# Patient Record
Sex: Male | Born: 1935 | Race: White | Hispanic: No | Marital: Married | Smoking: Former smoker
Health system: Southern US, Community
[De-identification: ages and names within clinical notes are randomized; demographics above are authoritative.]

## PROBLEM LIST (undated history)

## (undated) DIAGNOSIS — I639 Cerebral infarction, unspecified: Secondary | ICD-10-CM

## (undated) DIAGNOSIS — I509 Heart failure, unspecified: Secondary | ICD-10-CM

## (undated) DIAGNOSIS — I5032 Chronic diastolic (congestive) heart failure: Secondary | ICD-10-CM

## (undated) DIAGNOSIS — C61 Malignant neoplasm of prostate: Secondary | ICD-10-CM

## (undated) DIAGNOSIS — K2100 Gastro-esophageal reflux disease with esophagitis, without bleeding: Secondary | ICD-10-CM

## (undated) DIAGNOSIS — H547 Unspecified visual loss: Secondary | ICD-10-CM

## (undated) DIAGNOSIS — I10 Essential (primary) hypertension: Secondary | ICD-10-CM

## (undated) DIAGNOSIS — I350 Nonrheumatic aortic (valve) stenosis: Secondary | ICD-10-CM

## (undated) DIAGNOSIS — I351 Nonrheumatic aortic (valve) insufficiency: Secondary | ICD-10-CM

## (undated) DIAGNOSIS — D649 Anemia, unspecified: Secondary | ICD-10-CM

## (undated) DIAGNOSIS — I340 Nonrheumatic mitral (valve) insufficiency: Secondary | ICD-10-CM

## (undated) DIAGNOSIS — G8929 Other chronic pain: Secondary | ICD-10-CM

## (undated) HISTORY — DX: Gastro-esophageal reflux disease with esophagitis, without bleeding: K21.00

## (undated) HISTORY — DX: Gastro-esophageal reflux disease with esophagitis: K21.0

## (undated) HISTORY — DX: Essential (primary) hypertension: I10

## (undated) HISTORY — DX: Anemia, unspecified: D64.9

## (undated) HISTORY — DX: Unspecified visual loss: H54.7

## (undated) HISTORY — DX: Heart failure, unspecified: I50.9

## (undated) HISTORY — PX: COLONOSCOPY: SHX174

## (undated) HISTORY — DX: Malignant neoplasm of prostate: C61

## (undated) HISTORY — PX: OTHER SURGICAL HISTORY: SHX169

## (undated) HISTORY — DX: Other chronic pain: G89.29

## (undated) HISTORY — DX: Cerebral infarction, unspecified: I63.9

---

## 2013-03-10 ENCOUNTER — Encounter: Payer: Self-pay | Admitting: *Deleted

## 2013-03-11 ENCOUNTER — Encounter (INDEPENDENT_AMBULATORY_CARE_PROVIDER_SITE_OTHER): Payer: Self-pay

## 2013-03-11 ENCOUNTER — Ambulatory Visit (INDEPENDENT_AMBULATORY_CARE_PROVIDER_SITE_OTHER): Payer: Medicare Other | Admitting: Cardiovascular Disease

## 2013-03-11 ENCOUNTER — Encounter: Payer: Self-pay | Admitting: Cardiovascular Disease

## 2013-03-11 VITALS — BP 132/72 | HR 106 | Ht 69.0 in | Wt 180.2 lb

## 2013-03-11 DIAGNOSIS — I350 Nonrheumatic aortic (valve) stenosis: Secondary | ICD-10-CM | POA: Insufficient documentation

## 2013-03-11 DIAGNOSIS — R42 Dizziness and giddiness: Secondary | ICD-10-CM

## 2013-03-11 DIAGNOSIS — R011 Cardiac murmur, unspecified: Secondary | ICD-10-CM

## 2013-03-11 DIAGNOSIS — I498 Other specified cardiac arrhythmias: Secondary | ICD-10-CM

## 2013-03-11 DIAGNOSIS — R0602 Shortness of breath: Secondary | ICD-10-CM

## 2013-03-11 DIAGNOSIS — R000 Tachycardia, unspecified: Secondary | ICD-10-CM | POA: Insufficient documentation

## 2013-03-11 MED ORDER — METOPROLOL TARTRATE 25 MG PO TABS
25.0000 mg | ORAL_TABLET | Freq: Two times a day (BID) | ORAL | Status: DC
Start: 2013-03-11 — End: 2013-03-11

## 2013-03-11 MED ORDER — METOPROLOL TARTRATE 25 MG PO TABS: 25.0000 mg | ORAL_TABLET | Freq: Two times a day (BID) | ORAL | Status: DC

## 2013-03-11 NOTE — Progress Notes (Signed)
Nathan Kramer Date of Birth  1936-03-30       [facility]    [employer] [address]  [address] [place], [place]  [address]   [place], [place]  [address] [phone]     [phone]   Fax  [phone]    Fax [phone]  Problem List: 1. Heart Murmur 2. HTN 3. CVA - June 2014 4. Back injuries ( on chronic morphine)   History of Present Illness:   Nathan Kramer is a 77 yo from [facility] - recently moved to [place]. He has a history of a heart murmur. He was followed up there by cardiologist.    He was seen by Dr. Fawn Kirk  From the [facility] cardiology group.  He had a stroke this past summer. He  and his wife moved to [place] because of his health issues.  His last cardiologist suggested that his heart murmur was not serious.  He was recently seen by medical doctor who referred him to [place] for further evaluation and management of his heart murmur.  He's not able to get any regular exercise. He does move around the house. 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     Current Outpatient Prescriptions on File Prior to Visit  Medication Sig Dispense Refill  . diphenhydrAMINE (BENADRYL) 25 MG tablet Take 25 mg by mouth daily.      [name] esomeprazole (NEXIUM) 10 MG packet Take 20 mg by mouth daily before breakfast.      . hydrochlorothiazide (HYDRODIURIL) 25 MG tablet Take 25 mg by mouth as needed.       [name] morphine (MSIR) 30 MG tablet Take 30 mg by mouth 2 (two) times daily.       No current facility-administered medications on file prior to visit.    No Known Allergies  Past Medical History  Diagnosis Date  . Other chronic pain   . Unspecified essential hypertension   . Unspecified visual loss   . Reflux esophagitis   . Undiagnosed cardiac murmurs   . Prostate cancer     Past Surgical History  Procedure Laterality Date  . Back surgery    . Colonoscopy       History  Smoking status  . Current Some Day Smoker -- 1.00 packs/day for 3 years  . Types: Cigarettes  Smokeless tobacco  . Not on file    History  Alcohol Use No    Family History  Problem Relation Age of Onset  . Diabetes Mother   . Hypertension Mother   . Hypertension Father   . Heart disease Father   . Cancer Sister   . Heart disease Brother     Reviw of Systems:  Reviewed in the HPI.  All other systems are negative.  Physical Exam: Blood pressure 132/72, pulse 106, height 5\' 9"  (1.753 m), weight 180 lb 4 oz (81.761 kg). General: Well developed, well nourished, in no acute distress.  Head: Normocephalic, atraumatic, sclera non-icteric, mucus membranes are moist,   Neck: Supple. Carotids are 2 + without bruits. No JVD   Lungs: Clear   Heart: RR, tachycardic,  2/6 systolic murmur radiating to the axilla  Abdomen: Soft, non-tender, non-distended with normal bowel sounds.  Msk:  Strength and tone are normal   Extremities: No clubbing or cyanosis. No edema.  Distal pedal pulses are 2+ and equal  Neuro: CN II - XII intact.  Alert and oriented X 3.   Psych:  Normal   ECG: Dec. 9, 2014:  Sinus tachy,  NS ST changes,    Assessment / Plan:

## 2013-03-11 NOTE — Assessment & Plan Note (Signed)
His HR is fast.  Will start metoprolol 25 mg BID.  We may need to decrease it to 12.5 BID - I have asked them to call if he has any low BP symptoms or low BP readings.   I will see him in 6 months.

## 2013-03-11 NOTE — Assessment & Plan Note (Signed)
Nathan Kramer has a heart murmur that has been followed by cardiologist for several years. He was told that it was not especially serious. Murmurs the murmur sounds like it could be accommodation of aortic stenosis plus mitral regurgitation.  We have requested the echocardiogram that was performed last year. Is hemodynamically stable.  His heart rate is fast. We'll start him on metoprolol 25 mg twice a day. I'll see him in 6 months for followup visit. I have requested that he take his heart rate and blood pressure readings and record them on a legal pad.

## 2013-03-11 NOTE — Patient Instructions (Addendum)
Please start metoprolol 25mg  twice a day.  Your physician recommends that you schedule a follow-up appointment in: 6 months.

## 2013-03-25 ENCOUNTER — Ambulatory Visit: Payer: Self-pay | Admitting: Pain Medicine

## 2013-04-24 ENCOUNTER — Ambulatory Visit: Payer: Self-pay | Admitting: Pain Medicine

## 2013-04-25 ENCOUNTER — Ambulatory Visit: Payer: Self-pay | Admitting: Pain Medicine

## 2013-04-25 ENCOUNTER — Telehealth: Payer: Self-pay

## 2013-04-25 NOTE — Telephone Encounter (Signed)
Pt PCP, Dr Debbora Dus, called and questioned why Dr.  Acie Fredrickson did not order echo. Also states pt needs medical clearance per Dr Mohammed Kindle for a lumbar injection procedure. Dr. Miles Costain request we call the pt to let know status of  medical clearance. I will re- fax request for echo done in 2013.

## 2013-04-26 NOTE — Telephone Encounter (Signed)
Please inform the patient's primary medical doctor that the patient has already had an echo and I am waiting to get that report before ordering more tests.  The patient is asymptomatic and I think that it is very unlikely that he has a serious valve lesion that would require surgery.  If we do not receive the echo soon, we can order another one.

## 2013-04-26 NOTE — Telephone Encounter (Signed)
I do not need the echo results or any further testing to clear him for a back injection.  He is not on anticoagulants. He is at low risk for cardiac complications.

## 2013-04-28 NOTE — Telephone Encounter (Signed)
Left message for pt to call back  °

## 2013-04-28 NOTE — Telephone Encounter (Signed)
Spoke w/ pt's wife and made her aware of clearance. She would like [place] to send a letter to [facility] Primus B[facility]. Advised her that we have sent a second request for echo results and will call her if anything needs to be addressed once we receive this.  She is agreeable to this and will call w/ further questions or concerns.

## 2013-04-29 ENCOUNTER — Encounter: Payer: Self-pay | Admitting: *Deleted

## 2013-05-07 ENCOUNTER — Ambulatory Visit: Payer: Self-pay | Admitting: Pain Medicine

## 2013-05-08 ENCOUNTER — Telehealth: Payer: Self-pay | Admitting: *Deleted

## 2013-05-08 NOTE — Telephone Encounter (Signed)
Approval for facet block sent to [facility] 05/08/13.

## 2013-05-12 ENCOUNTER — Ambulatory Visit: Payer: Self-pay | Admitting: Pain Medicine

## 2013-05-13 ENCOUNTER — Ambulatory Visit: Payer: Self-pay | Admitting: Ophthalmology

## 2013-05-13 LAB — POTASSIUM: POTASSIUM: 4.2 mmol/L (ref 3.5–5.1)

## 2013-05-20 ENCOUNTER — Ambulatory Visit: Payer: Self-pay | Admitting: Ophthalmology

## 2013-06-10 ENCOUNTER — Ambulatory Visit: Payer: Self-pay | Admitting: Pain Medicine

## 2013-06-16 ENCOUNTER — Ambulatory Visit: Payer: Self-pay | Admitting: Pain Medicine

## 2013-07-10 ENCOUNTER — Ambulatory Visit: Payer: Self-pay | Admitting: Pain Medicine

## 2013-07-16 ENCOUNTER — Ambulatory Visit: Payer: Self-pay | Admitting: Family Medicine

## 2013-08-07 ENCOUNTER — Ambulatory Visit: Payer: Self-pay | Admitting: Pain Medicine

## 2013-08-22 ENCOUNTER — Ambulatory Visit: Payer: Self-pay | Admitting: Gastroenterology

## 2013-09-02 ENCOUNTER — Ambulatory Visit: Payer: Self-pay | Admitting: Pain Medicine

## 2013-09-09 ENCOUNTER — Ambulatory Visit (INDEPENDENT_AMBULATORY_CARE_PROVIDER_SITE_OTHER): Payer: Medicare Other | Admitting: Cardiovascular Disease

## 2013-09-09 ENCOUNTER — Encounter: Payer: Self-pay | Admitting: Cardiovascular Disease

## 2013-09-09 VITALS — BP 128/78 | HR 78 | Ht 69.0 in | Wt 180.5 lb

## 2013-09-09 DIAGNOSIS — I350 Nonrheumatic aortic (valve) stenosis: Secondary | ICD-10-CM

## 2013-09-09 DIAGNOSIS — R0602 Shortness of breath: Secondary | ICD-10-CM

## 2013-09-09 DIAGNOSIS — I359 Nonrheumatic aortic valve disorder, unspecified: Secondary | ICD-10-CM

## 2013-09-09 NOTE — Assessment & Plan Note (Signed)
Nathan Kramer is doing well.  He has moderate AS. I do not think that he has any limitations related to his aortic stenosis. I think that all of his limitations are due to his back pain. He also has some generalized deconditioning because of his inability to exercise. Again I think this is secondary to his back problems.  We'll continue with the same medications. I have recommended that he avoid salt. His wife confirms that he sprinkle salt on a lot of his food. I've advised him that reducing his salt will help with his symptoms and will help with his leg edema.  I'll see him again in one year for followup visit.

## 2013-09-09 NOTE — Progress Notes (Signed)
Nathan Kramer Date of Birth  04-09-1935       [facility]    [employer] [address], [place], [place]  [address]   [place], [place]  [address] [phone]     [phone]   Fax  [phone]    Fax [phone]  Problem List: 1. aortic stenosis-mild to moderate 2. HTN 3. CVA - June 2014 4. Back injuries ( on chronic morphine)  5. Prostate cancer-status post radiation therapy  History of Present Illness:   Nathan Kramer is a 78 yo from [facility] - recently moved to [place]. He has a history of a heart murmur. He was followed up there by cardiologist.    He was seen by Dr. Nat Christen  From the [facility] cardiology group.  He had a stroke this past summer. He  and his wife moved to [place] because of his health issues.  His last cardiologist suggested that his heart murmur was not serious.  He was recently seen by medical doctor who referred him to [place] for further evaluation and management of his heart murmur.  He's not able to get any regular exercise. He does move around the house. 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   September 09, 2013:  Pt has done ok.  Has mild - mod aortic stenosis.  He has some dyspnea.  Wife says that he still eats salt. Due to have another back injection .  He did well with the last one several months ago.    Current Outpatient Prescriptions on File Prior to Visit  Medication Sig Dispense Refill  . diphenhydrAMINE (BENADRYL) 25 MG tablet Take 25 mg by mouth daily.      [name] esomeprazole (NEXIUM) 10 MG packet Take 20 mg by mouth daily before breakfast.      . hydrochlorothiazide (HYDRODIURIL) 25 MG tablet Take 25 mg by mouth as needed.       [name] morphine (MSIR) 30 MG tablet Take 30 mg by mouth 2 (two) times daily.       No current facility-administered medications on file prior to visit.    Allergies  Allergen Reactions  .  Amoxicillin     Face swelling     Past Medical History  Diagnosis Date  . Other chronic pain   . Unspecified essential hypertension   . Unspecified visual loss   . Reflux esophagitis   . Undiagnosed cardiac murmurs   . Prostate cancer     Past Surgical History  Procedure Laterality Date  . Back surgery    . Colonoscopy      History  Smoking status  . Current Some Day Smoker -- 1.00 packs/day for 50 years  . Types: Cigarettes  Smokeless tobacco  . Not on file    History  Alcohol Use No    Family History  Problem Relation Age of Onset  . Diabetes Mother   . Hypertension Mother   . Hypertension Father   . Heart disease Father   . Cancer Sister   . Heart disease Brother     Reviw of Systems:  Reviewed in the HPI.  All other systems are negative.  Physical Exam: Blood pressure 128/78, pulse 78, height 5\' 9"  (1.753 m), weight 180 lb 8 oz (81.874 kg). General: Well developed, well nourished, in no acute distress.  Head: Normocephalic, atraumatic, sclera non-icteric,  mucus membranes are moist,   Neck: Supple. Carotids are 2 + without bruits. No JVD   Lungs: Clear   Heart: RR, tachycardic,  2/6 systolic harsh murmur radiating to the axilla  Abdomen: Soft, non-tender, non-distended with normal bowel sounds.  Msk:  Strength and tone are normal   Extremities: No clubbing or cyanosis. Trace - 1+ pitting edema.  Distal pedal pulses are 2+ and equal   Neuro: CN II - XII intact.  Alert and oriented X 3.   Psych:  Normal   ECG: 09/09/2013: Normal sinus rhythm at 78. EKG is normal  Assessment / Plan:

## 2013-09-09 NOTE — Patient Instructions (Signed)
Your physician wants you to follow-up in: 1 year. You will receive a reminder letter in the mail two months in advance. If you don't receive a letter, please call our office to schedule the follow-up appointment.  Follow a low sodium diet

## 2013-09-15 ENCOUNTER — Ambulatory Visit: Payer: Self-pay | Admitting: Pain Medicine

## 2013-10-01 ENCOUNTER — Ambulatory Visit: Payer: Self-pay | Admitting: Pain Medicine

## 2013-11-06 ENCOUNTER — Ambulatory Visit: Payer: Self-pay | Admitting: Pain Medicine

## 2013-11-12 ENCOUNTER — Ambulatory Visit: Payer: Self-pay | Admitting: Pain Medicine

## 2013-12-02 ENCOUNTER — Ambulatory Visit: Payer: Self-pay | Admitting: Pain Medicine

## 2013-12-30 ENCOUNTER — Ambulatory Visit: Payer: Self-pay | Admitting: Pain Medicine

## 2014-02-02 ENCOUNTER — Ambulatory Visit: Payer: Self-pay | Admitting: Pain Medicine

## 2014-03-04 ENCOUNTER — Ambulatory Visit: Payer: Self-pay | Admitting: Pain Medicine

## 2014-03-30 ENCOUNTER — Ambulatory Visit: Payer: Self-pay | Admitting: Pain Medicine

## 2014-05-05 ENCOUNTER — Ambulatory Visit: Payer: Self-pay | Admitting: Pain Medicine

## 2014-06-02 ENCOUNTER — Other Ambulatory Visit: Payer: Self-pay | Admitting: Urgent Care

## 2014-06-03 ENCOUNTER — Ambulatory Visit: Payer: Self-pay | Admitting: Pain Medicine

## 2014-06-11 DIAGNOSIS — M545 Low back pain, unspecified: Secondary | ICD-10-CM | POA: Insufficient documentation

## 2014-06-11 DIAGNOSIS — G8929 Other chronic pain: Secondary | ICD-10-CM | POA: Insufficient documentation

## 2014-06-11 DIAGNOSIS — Z8546 Personal history of malignant neoplasm of prostate: Secondary | ICD-10-CM | POA: Insufficient documentation

## 2014-06-11 DIAGNOSIS — K219 Gastro-esophageal reflux disease without esophagitis: Secondary | ICD-10-CM | POA: Insufficient documentation

## 2014-06-11 DIAGNOSIS — R011 Cardiac murmur, unspecified: Secondary | ICD-10-CM | POA: Insufficient documentation

## 2014-06-11 DIAGNOSIS — H543 Unqualified visual loss, both eyes: Secondary | ICD-10-CM | POA: Insufficient documentation

## 2014-06-30 ENCOUNTER — Ambulatory Visit: Payer: Self-pay | Admitting: Pain Medicine

## 2014-07-25 NOTE — Op Note (Signed)
PATIENT NAME:  Nathan Kramer, Nathan Kramer MR#:  [id number] DATE OF BIRTH:  May 30, 1935  DATE OF PROCEDURE:  05/20/2013  PREOPERATIVE DIAGNOSIS: Visually significant cataract of the left eye.   POSTOPERATIVE DIAGNOSIS: Visually significant cataract of the left eye.   OPERATIVE PROCEDURE: Cataract extraction by phacoemulsification with implant of intraocular lens to left eye.   SURGEON: Birder Robson, MD.   ANESTHESIA:  1. Managed anesthesia care.  2. Topical tetracaine drops followed by 2% Xylocaine jelly applied in the preoperative holding area.   COMPLICATIONS: None.   TECHNIQUE:  Stop-and-chop.  DESCRIPTION OF PROCEDURE: The patient was examined and consented in the preoperative holding area where the aforementioned topical anesthesia was applied to the left eye and then brought back to the Operating Room where the left eye was prepped and draped in the usual sterile ophthalmic fashion and a lid speculum was placed. A paracentesis was created with the side port blade and the anterior chamber was filled with viscoelastic. A near clear corneal incision was performed with the steel keratome. A continuous curvilinear capsulorrhexis was performed with a cystotome followed by the capsulorrhexis forceps. Hydrodissection and hydrodelineation were carried out with BSS on a blunt cannula. The lens was removed in a stop-and-chop technique and the remaining cortical material was removed with the irrigation-aspiration handpiece. The capsular bag was inflated with viscoelastic and the Tecnis ZCB00 23.0-diopter lens, serial number [id number] was placed in the capsular bag without complication. The remaining viscoelastic was removed from the eye with the irrigation-aspiration handpiece. The wounds were hydrated. The anterior chamber was flushed with Miostat and the eye was inflated to physiologic pressure. 0.1 mL of cefuroxime concentration 10 mg/mL was placed in the anterior chamber. The wounds were found to be water  tight. The eye was dressed with Vigamox. The patient was given protective glasses to wear throughout the day and a shield with which to sleep tonight. The patient was also given drops with which to begin a drop regimen today and will follow-up with me in one day.   Note, following removal of the cataract and inflation of the capsule with viscoelastic, the iris prolapsed through the main incision. It was reposited using an iris sweep. The lens was placed in the eye uneventfully and a single 10-0 nylon suture was placed through the main incision for safety. The events of this case were discussed with the patient and the family members immediately following surgery.   ____________________________ Livingston Diones Andie Mortimer, MD wlp:cs D: 05/20/2013 19:57:58 ET T: 05/20/2013 20:10:42 ET JOB#: [id number]  cc: Keeshawn Fakhouri L. Pernell Lenoir, MD, <Dictator> Livingston Diones Shanaya Schneck MD ELECTRONICALLY SIGNED 05/22/2013 11:57

## 2014-07-30 ENCOUNTER — Ambulatory Visit
Admit: 2014-07-30 | Disposition: A | Discharge status: Home / Self Care | Payer: Self-pay | Attending: Pain Medicine | Admitting: Pain Medicine

## 2014-08-20 ENCOUNTER — Ambulatory Visit (INDEPENDENT_AMBULATORY_CARE_PROVIDER_SITE_OTHER): Payer: Medicare Other | Admitting: Cardiovascular Disease

## 2014-08-20 ENCOUNTER — Encounter: Payer: Self-pay | Admitting: Cardiovascular Disease

## 2014-08-20 VITALS — BP 118/60 | HR 88 | Ht 68.0 in | Wt 169.0 lb

## 2014-08-20 DIAGNOSIS — I10 Essential (primary) hypertension: Secondary | ICD-10-CM | POA: Diagnosis not present

## 2014-08-20 DIAGNOSIS — I350 Nonrheumatic aortic (valve) stenosis: Secondary | ICD-10-CM

## 2014-08-20 DIAGNOSIS — I471 Supraventricular tachycardia: Secondary | ICD-10-CM

## 2014-08-20 DIAGNOSIS — R000 Tachycardia, unspecified: Secondary | ICD-10-CM

## 2014-08-20 NOTE — Progress Notes (Signed)
Cardiology Office Note   Date:  08/20/2014   ID:  Nathan Kramer, DOB 1935/05/10, MRN [id number]  PCP:  Nathan Harrier, MD  Cardiologist:   Nathan Headings, MD   Chief Complaint  Patient presents with  . Follow-up    12 month follow up. Meds reviewed by the patient verbally. "doing well."   1. aortic stenosis-mild to moderate 2. HTN 3. CVA - June 2014 4. Back injuries ( on chronic morphine)  5. Prostate cancer-status post radiation therapy  History of Present Illness:  Nathan Kramer is a 79 yo from [facility] - recently moved to [place]. He has a history of a heart murmur. He was followed up there by cardiologist. He was seen by Nathan Kramer From the [facility] cardiology group. He had a stroke this past summer. He and his wife moved to [place] because of his health issues. His last cardiologist suggested that his heart murmur was not serious.  He was recently seen by medical doctor who referred him to [place] for further evaluation and management of his heart murmur.  He's not able to get any regular exercise. He does move around the house. 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   September 09, 2013:  Pt has done ok. Has mild - mod aortic stenosis. He has some dyspnea. Wife says that he still eats salt. Due to have another back injection . He did well with the last one several months ago   Aug 20, 2014:  Nathan Kramer is a 79 y.o. male who presents for follow-up of his hypertension and mild aortic stenosis. He has lost about 20 lbs.  Doing well.    Past Medical History  Diagnosis Date  . Other chronic pain   . Unspecified essential hypertension   . Unspecified visual loss   . Reflux esophagitis   . Undiagnosed cardiac murmurs   . Prostate cancer   . Anemia   . Stroke     Past Surgical History  Procedure Laterality Date  . Back surgery    . Colonoscopy       Current Outpatient  Prescriptions  Medication Sig Dispense Refill  . diphenhydrAMINE (BENADRYL) 25 MG tablet Take 25 mg by mouth daily.    [name] esomeprazole (NEXIUM) 10 MG packet Take 20 mg by mouth daily before breakfast.    . hydrochlorothiazide (HYDRODIURIL) 25 MG tablet Take 25 mg by mouth as needed.     [name] HYDROcodone-acetaminophen (NORCO/VICODIN) 5-325 MG per tablet Take 1 tablet by mouth every 6 (six) hours as needed for moderate pain.    . metoprolol tartrate (LOPRESSOR) 25 MG tablet Take 12.5 mg by mouth at bedtime.    [name] morphine (MSIR) 30 MG tablet Take 30 mg by mouth 2 (two) times daily.     No current facility-administered medications for this visit.    Allergies:   Amoxicillin    Social History:  The patient  reports that he has been smoking Cigarettes.  He has a 50 pack-year smoking history. He does not have any smokeless tobacco history on file. He reports that he does not drink alcohol or use illicit drugs.   Family History:  The patient's family history includes Cancer in his sister; Diabetes in his mother; Heart disease in his brother and father; Hypertension in his father and mother.    ROS:  Please see the history of present illness.    Review of Systems:  Constitutional:  denies fever, chills, diaphoresis, appetite change and fatigue.  HEENT: denies photophobia, eye pain, redness, hearing loss, ear pain, congestion, sore throat, rhinorrhea, sneezing, neck pain, neck stiffness and tinnitus.  Respiratory: denies SOB, DOE, cough, chest tightness, and wheezing.  Cardiovascular: denies chest pain, palpitations and leg swelling.  Gastrointestinal: denies nausea, vomiting, abdominal pain, diarrhea, constipation, blood in stool.  Genitourinary: denies dysuria, urgency, frequency, hematuria, flank pain and difficulty urinating.  Musculoskeletal: denies  myalgias, back pain, joint swelling, arthralgias and gait problem.   Skin: denies pallor, rash and wound.  Neurological: denies dizziness,  seizures, syncope, weakness, light-headedness, numbness and headaches.   Hematological: denies adenopathy, easy bruising, personal or family bleeding history.  Psychiatric/ Behavioral: denies suicidal ideation, mood changes, confusion, nervousness, sleep disturbance and agitation.       All other systems are reviewed and negative.    PHYSICAL EXAM: VS:  BP 118/60 mmHg  Pulse 88  Ht 5\' 8"  (1.727 m)  Wt 76.658 kg (169 lb)  BMI 25.70 kg/m2 , BMI Body mass index is 25.7 kg/(m^2). GEN: Well nourished, well developed, in no acute distress HEENT: normal Neck: no JVD, carotid bruits, or masses Cardiac: RR; 2/6  Systolic murmur, rubs, or gallops, no edema  Respiratory:  clear to auscultation bilaterally, normal work of breathing GI: soft, nontender, nondistended, + BS MS: no deformity or atrophy Skin: warm and dry, no rash Neuro:  Strength and sensation are intact Psych: normal   EKG:  EKG is ordered today. The ekg ordered today demonstrates NSR at 88, normal    Recent Labs: No results found for requested labs within last 365 days.    Lipid Panel No results found for: CHOL, TRIG, HDL, CHOLHDL, VLDL, LDLCALC, LDLDIRECT    Wt Readings from Last 3 Encounters:  08/20/14 76.658 kg (169 lb)  09/09/13 81.874 kg (180 lb 8 oz)  03/11/13 81.761 kg (180 lb 4 oz)      Other studies Reviewed: Additional studies/ records that were reviewed today include: . Review of the above records demonstrates:    ASSESSMENT AND PLAN:  1. aortic stenosis-mild to moderate 2. HTN 3. CVA - June 2014 4. Back injuries ( on chronic morphine)  5. Prostate cancer-status post radiation therapy   Current medicines are reviewed at length with the patient today.  The patient does not have concerns regarding medicines.  The following changes have been made:  no change  Labs/ tests ordered today include:  Orders Placed This Encounter  Procedures  . EKG 12-Lead     Disposition:   FU with me in 1  year.     Nathan Kramer, Nathan Cheng, MD  08/20/2014 2:11 PM    [facility] [address], [place], [place]  [address] Phone: [phone]; Fax: [phone]   [facility]  [address] [place], [place]  [address] [phone]    Fax [phone]

## 2014-08-20 NOTE — Patient Instructions (Signed)
Medication Instructions:  Your physician recommends that you continue on your current medications as directed. Please refer to the Current Medication list given to you today.   Labwork: None  Testing/Procedures: None  Follow-Up: Your physician wants you to follow-up in: ONE YEAR with Dr. Acie Fredrickson. You will receive a reminder letter in the mail two months in advance. If you don't receive a letter, please call our office to schedule the follow-up appointment.   Any Other Special Instructions Will Be Listed Below (If Applicable).

## 2014-08-31 ENCOUNTER — Other Ambulatory Visit: Payer: Self-pay | Admitting: Pain Medicine

## 2014-08-31 DIAGNOSIS — M5136 Other intervertebral disc degeneration, lumbar region: Secondary | ICD-10-CM

## 2014-08-31 DIAGNOSIS — M5030 Other cervical disc degeneration, unspecified cervical region: Secondary | ICD-10-CM

## 2014-08-31 DIAGNOSIS — M48062 Spinal stenosis, lumbar region with neurogenic claudication: Secondary | ICD-10-CM

## 2014-08-31 DIAGNOSIS — M533 Sacrococcygeal disorders, not elsewhere classified: Secondary | ICD-10-CM

## 2014-08-31 DIAGNOSIS — M47817 Spondylosis without myelopathy or radiculopathy, lumbosacral region: Secondary | ICD-10-CM

## 2014-08-31 DIAGNOSIS — M51369 Other intervertebral disc degeneration, lumbar region without mention of lumbar back pain or lower extremity pain: Secondary | ICD-10-CM

## 2014-08-31 DIAGNOSIS — M5134 Other intervertebral disc degeneration, thoracic region: Secondary | ICD-10-CM

## 2014-09-01 ENCOUNTER — Ambulatory Visit: Payer: Medicare Other | Attending: Pain Medicine | Admitting: Pain Medicine

## 2014-09-01 ENCOUNTER — Encounter: Payer: Self-pay | Admitting: Pain Medicine

## 2014-09-01 VITALS — BP 108/74 | HR 88 | Temp 98.2°F | Resp 18 | Ht 68.0 in | Wt 157.0 lb

## 2014-09-01 DIAGNOSIS — M5481 Occipital neuralgia: Secondary | ICD-10-CM | POA: Diagnosis not present

## 2014-09-01 DIAGNOSIS — M47816 Spondylosis without myelopathy or radiculopathy, lumbar region: Secondary | ICD-10-CM | POA: Insufficient documentation

## 2014-09-01 DIAGNOSIS — Z981 Arthrodesis status: Secondary | ICD-10-CM | POA: Diagnosis not present

## 2014-09-01 DIAGNOSIS — M461 Sacroiliitis, not elsewhere classified: Secondary | ICD-10-CM | POA: Diagnosis not present

## 2014-09-01 DIAGNOSIS — M179 Osteoarthritis of knee, unspecified: Secondary | ICD-10-CM | POA: Insufficient documentation

## 2014-09-01 DIAGNOSIS — M5136 Other intervertebral disc degeneration, lumbar region: Secondary | ICD-10-CM | POA: Diagnosis not present

## 2014-09-01 DIAGNOSIS — M4806 Spinal stenosis, lumbar region: Secondary | ICD-10-CM | POA: Diagnosis not present

## 2014-09-01 DIAGNOSIS — M5134 Other intervertebral disc degeneration, thoracic region: Secondary | ICD-10-CM

## 2014-09-01 DIAGNOSIS — M5126 Other intervertebral disc displacement, lumbar region: Secondary | ICD-10-CM | POA: Diagnosis not present

## 2014-09-01 DIAGNOSIS — M47817 Spondylosis without myelopathy or radiculopathy, lumbosacral region: Secondary | ICD-10-CM

## 2014-09-01 DIAGNOSIS — M546 Pain in thoracic spine: Secondary | ICD-10-CM | POA: Diagnosis present

## 2014-09-01 DIAGNOSIS — M170 Bilateral primary osteoarthritis of knee: Secondary | ICD-10-CM

## 2014-09-01 DIAGNOSIS — M533 Sacrococcygeal disorders, not elsewhere classified: Secondary | ICD-10-CM | POA: Insufficient documentation

## 2014-09-01 DIAGNOSIS — M48062 Spinal stenosis, lumbar region with neurogenic claudication: Secondary | ICD-10-CM

## 2014-09-01 DIAGNOSIS — M5030 Other cervical disc degeneration, unspecified cervical region: Secondary | ICD-10-CM

## 2014-09-01 MED ORDER — HYDROCODONE-ACETAMINOPHEN 10-325 MG PO TABS: ORAL_TABLET | ORAL | Status: DC

## 2014-09-01 MED ORDER — MORPHINE SULFATE 30 MG PO TABS: ORAL_TABLET | ORAL | Status: DC

## 2014-09-01 NOTE — Progress Notes (Signed)
Discharged to home with script for Morphine and Hydrocodone in hand.  Ambulatory.  Pre procedure instructions given with teach back 3.

## 2014-09-01 NOTE — Patient Instructions (Addendum)
Continue present medications.  Geniculate nerve blocks of right knee 09/09/2014  F/U PCP for evaliation of  BP and general medical  condition.  F/U surgical evaluation.  F/U neurological evaluation.  May consider radiofrequency rhizolysis or intraspinal procedures pending response to present treatment and F/U evaluation.  [facility] to [facility] should patient have concerns prior to scheduled return appointment. GENERAL RISKS AND COMPLICATIONS  What are the risk, side effects and possible complications? Generally speaking, most procedures are safe.  However, with any procedure there are risks, side effects, and the possibility of complications.  The risks and complications are dependent upon the sites that are lesioned, or the type of nerve block to be performed.  The closer the procedure is to the spine, the more serious the risks are.  Great care is taken when placing the radio frequency needles, block needles or lesioning probes, but sometimes complications can occur. 1. Infection: Any time there is an injection through the skin, there is a risk of infection.  This is why sterile conditions are used for these blocks.  There are four possible types of infection. 1. Localized skin infection. 2. Central Nervous System Infection-This can be in the form of Meningitis, which can be deadly. 3. Epidural Infections-This can be in the form of an epidural abscess, which can cause pressure inside of the spine, causing compression of the spinal cord with subsequent paralysis. This would require an emergency surgery to decompress, and there are no guarantees that the patient would recover from the paralysis. 4. Discitis-This is an infection of the intervertebral discs.  It occurs in about 1% of discography procedures.  It is difficult to treat and it may lead to surgery.        2. Pain: the needles have to go through skin and soft tissues, will cause soreness.       3. Damage to internal  structures:  The nerves to be lesioned may be near blood vessels or    other nerves which can be potentially damaged.       4. Bleeding: Bleeding is more common if the patient is taking blood thinners such as  aspirin, Coumadin, Ticiid, Plavix, etc., or if he/she have some genetic predisposition  such as hemophilia. Bleeding into the spinal canal can cause compression of the spinal  cord with subsequent paralysis.  This would require an emergency surgery to  decompress and there are no guarantees that the patient would recover from the  paralysis.       5. Pneumothorax:  Puncturing of a lung is a possibility, every time a needle is introduced in  the area of the chest or upper back.  Pneumothorax refers to free air around the  collapsed lung(s), inside of the thoracic cavity (chest cavity).  Another two possible  complications related to a similar event would include: Hemothorax and Chylothorax.   These are variations of the Pneumothorax, where instead of air around the collapsed  lung(s), you may have blood or chyle, respectively.       6. Spinal headaches: They may occur with any procedures in the area of the spine.       7. Persistent CSF (Cerebro-Spinal Fluid) leakage: This is a rare problem, but may occur  with prolonged intrathecal or epidural catheters either due to the formation of a fistulous  track or a dural tear.       8. Nerve damage: By working so close to the spinal cord, there is always a  possibility of  nerve damage, which could be as serious as a permanent spinal cord injury with  paralysis.       9. Death:  Although rare, severe deadly allergic reactions known as "Anaphylactic  reaction" can occur to any of the medications used.      10. Worsening of the symptoms:  We can always make thing worse.  What are the chances of something like this happening? Chances of any of this occuring are extremely low.  By statistics, you have more of a chance of getting killed in a motor vehicle  accident: while driving to the hospital than any of the above occurring .  Nevertheless, you should be aware that they are possibilities.  In general, it is similar to taking a shower.  Everybody knows that you can slip, hit your head and get killed.  Does that mean that you should not shower again?  Nevertheless always keep in mind that statistics do not mean anything if you happen to be on the wrong side of them.  Even if a procedure has a 1 (one) in a 1,000,000 (million) chance of going wrong, it you happen to be that one..Also, keep in mind that by statistics, you have more of a chance of having something go wrong when taking medications.  Who should not have this procedure? If you are on a blood thinning medication (e.g. Coumadin, Plavix, see list of "Blood Thinners"), or if you have an active infection going on, you should not have the procedure.  If you are taking any blood thinners, please inform your physician.  How should I prepare for this procedure?  Do not eat or drink anything at least six hours prior to the procedure.  Bring a driver with you .  It cannot be a taxi.  Come accompanied by an adult that can drive you back, and that is strong enough to help you if your legs get weak or numb from the local anesthetic.  Take all of your medicines the morning of the procedure with just enough water to swallow them.  If you have diabetes, make sure that you are scheduled to have your procedure done first thing in the morning, whenever possible.  If you have diabetes, take only half of your insulin dose and notify our nurse that you have done so as soon as you arrive at the clinic.  If you are diabetic, but only take blood sugar pills (oral hypoglycemic), then do not take them on the morning of your procedure.  You may take them after you have had the procedure.  Do not take aspirin or any aspirin-containing medications, at least eleven (11) days prior to the procedure.  They may prolong  bleeding.  Wear loose fitting clothing that may be easy to take off and that you would not mind if it got stained with Betadine or blood.  Do not wear any jewelry or perfume  Remove any nail coloring.  It will interfere with some of our monitoring equipment.  NOTE: Remember that this is not meant to be interpreted as a complete list of all possible complications.  Unforeseen problems may occur.  BLOOD THINNERS The following drugs contain aspirin or other products, which can cause increased bleeding during surgery and should not be taken for 2 weeks prior to and 1 week after surgery.  If you should need take something for relief of minor pain, you may take acetaminophen which is found in Tylenol,m Datril, Anacin-3 and Panadol. It  is not blood thinner. The products listed below are.  Do not take any of the products listed below in addition to any listed on your instruction sheet.  A.P.C or A.P.C with Codeine Codeine Phosphate Capsules #3 Ibuprofen Ridaura  ABC compound Congesprin Imuran rimadil  Advil Cope Indocin Robaxisal  Alka-Seltzer Effervescent Pain Reliever and Antacid Coricidin or Coricidin-D  Indomethacin Rufen  Alka-Seltzer plus Cold Medicine Cosprin Ketoprofen S-A-C Tablets  Anacin Analgesic Tablets or Capsules Coumadin Korlgesic Salflex  Anacin Extra Strength Analgesic tablets or capsules CP-2 Tablets Lanoril Salicylate  Anaprox Cuprimine Capsules Levenox Salocol  Anexsia-D Dalteparin Magan Salsalate  Anodynos Darvon compound Magnesium Salicylate Sine-off  Ansaid Dasin Capsules Magsal Sodium Salicylate  Anturane Depen Capsules Marnal Soma  APF Arthritis pain formula Dewitt's Pills Measurin Stanback  Argesic Dia-Gesic Meclofenamic Sulfinpyrazone  Arthritis Bayer Timed Release Aspirin Diclofenac Meclomen Sulindac  Arthritis pain formula Anacin Dicumarol Medipren Supac  Analgesic (Safety coated) Arthralgen Diffunasal Mefanamic Suprofen  Arthritis Strength Bufferin Dihydrocodeine  Mepro Compound Suprol  Arthropan liquid Dopirydamole Methcarbomol with Aspirin Synalgos  ASA tablets/Enseals Disalcid Micrainin Tagament  Ascriptin Doan's Midol Talwin  Ascriptin A/D Dolene Mobidin Tanderil  Ascriptin Extra Strength Dolobid Moblgesic Ticlid  Ascriptin with Codeine Doloprin or Doloprin with Codeine Momentum Tolectin  Asperbuf Duoprin Mono-gesic Trendar  Aspergum Duradyne Motrin or Motrin IB Triminicin  Aspirin plain, buffered or enteric coated Durasal Myochrisine Trigesic  Aspirin Suppositories Easprin Nalfon Trillsate  Aspirin with Codeine Ecotrin Regular or Extra Strength Naprosyn Uracel  Atromid-S Efficin Naproxen Ursinus  Auranofin Capsules Elmiron Neocylate Vanquish  Axotal Emagrin Norgesic Verin  Azathioprine Empirin or Empirin with Codeine Normiflo Vitamin E  Azolid Emprazil Nuprin Voltaren  Bayer Aspirin plain, buffered or children's or timed BC Tablets or powders Encaprin Orgaran Warfarin Sodium  Buff-a-Comp Enoxaparin Orudis Zorpin  Buff-a-Comp with Codeine Equegesic Os-Cal-Gesic   Buffaprin Excedrin plain, buffered or Extra Strength Oxalid   Bufferin Arthritis Strength Feldene Oxphenbutazone   Bufferin plain or Extra Strength Feldene Capsules Oxycodone with Aspirin   Bufferin with Codeine Fenoprofen Fenoprofen Pabalate or Pabalate-SF   Buffets II Flogesic Panagesic   Buffinol plain or Extra Strength Florinal or Florinal with Codeine Panwarfarin   Buf-Tabs Flurbiprofen Penicillamine   Butalbital Compound Four-way cold tablets Penicillin   Butazolidin Fragmin Pepto-Bismol   Carbenicillin Geminisyn Percodan   Carna Arthritis Reliever Geopen Persantine   Carprofen Gold's salt Persistin   Chloramphenicol Goody's Phenylbutazone   Chloromycetin Haltrain Piroxlcam   Clmetidine heparin Plaquenil   Cllnoril Hyco-pap Ponstel   Clofibrate Hydroxy chloroquine Propoxyphen         Before stopping any of these medications, be sure to consult the physician who ordered  them.  Some, such as Coumadin (Warfarin) are ordered to prevent or treat serious conditions such as "deep thrombosis", "pumonary embolisms", and other heart problems.  The amount of time that you may need off of the medication may also vary with the medication and the reason for which you were taking it.  If you are taking any of these medications, please make sure you notify your pain physician before you undergo any procedures.         Knee Injection Joint injections are shots. Your caregiver will place a needle into your knee joint. The needle is used to put medicine into the joint. These shots can be used to help treat different painful knee conditions such as osteoarthritis, bursitis, local flare-ups of rheumatoid arthritis, and pseudogout. Anti-inflammatory medicines such as corticosteroids and anesthetics are the most  common medicines used for joint and soft tissue injections.  PROCEDURE 2. The skin over the kneecap will be cleaned with an antiseptic solution. 3. Your caregiver will inject a small amount of a local anesthetic (a medicine like Novocaine) just under the skin in the area that was cleaned. 4. After the area becomes numb, a second injection is done. This second injection usually includes an anesthetic and an anti-inflammatory medicine called a steroid or cortisone. The needle is carefully placed in between the kneecap and the knee, and the medicine is injected into the joint space. 5. After the injection is done, the needle is removed. Your caregiver may place a bandage over the injection site. The whole procedure takes no more than a couple of minutes. BEFORE THE PROCEDURE  Wash all of the skin around the entire knee area. Try to remove any loose, scaling skin. There is no other specific preparation necessary unless advised otherwise by your caregiver. LET YOUR CAREGIVER KNOW ABOUT:   Allergies.  Medications taken including herbs, eye drops, over the counter medications, and  creams.  Use of steroids (by mouth or creams).  Possible pregnancy, if applicable.  Previous problems with anesthetics or Novocaine.  History of blood clots (thrombophlebitis).  History of bleeding or blood problems.  Previous surgery.  Other health problems. RISKS AND COMPLICATIONS Side effects from cortisone shots are rare. They include:   Slight bruising of the skin.  Shrinkage of the normal fatty tissue under the skin where the shot was given.  Increase in pain after the shot.  Infection.  Weakening of tendons or tendon rupture.  Allergic reaction to the medicine.  Diabetics may have a temporary increase in their blood sugar after a shot.  Cortisone can temporarily weaken the immune system. While receiving these shots, you should not get certain vaccines. Also, avoid contact with anyone who has chickenpox or measles. Especially if you have never had these diseases or have not been previously immunized. Your immune system may not be strong enough to fight off the infection while the cortisone is in your system. AFTER THE PROCEDURE   You can go home after the procedure.  You may need to put ice on the joint 15-20 minutes every 3 or 4 hours until the pain goes away.  You may need to put an elastic bandage on the joint. HOME CARE INSTRUCTIONS  12. Only take over-the-counter or prescription medicines for pain, discomfort, or fever as directed by your caregiver. 13. You should avoid stressing the joint. Unless advised otherwise, avoid activities that put a lot of pressure on a knee joint, such as: 1. Jogging. 2. Bicycling. 3. Recreational climbing. 4. Hiking. 14. Laying down and elevating the leg/knee above the level of your heart can help to minimize swelling. SEEK MEDICAL CARE IF:   You have repeated or worsening swelling.  There is drainage from the puncture area.  You develop red streaking that extends above or below the site where the needle was  inserted. SEEK IMMEDIATE MEDICAL CARE IF:   You develop a fever.  You have pain that gets worse even though you are taking pain medicine.  The area is red and warm, and you have trouble moving the joint. MAKE SURE YOU:   Understand these instructions.  Will watch your condition.  Will get help right away if you are not doing well or get worse. Document Released: 06/11/2006 Document Revised: 06/12/2011 Document Reviewed: 03/08/2007 [facility] Patient Information 2015 [place], [place]. This information is not intended  to replace advice given to you by your health care provider. Make sure you discuss any questions you have with your health care provider.  

## 2014-09-01 NOTE — Progress Notes (Signed)
Subjective:    Patient ID: Nathan Kramer, male    DOB: [birth year], 79 y.o.   MRN: [id number]  HPI   patient is 79 year old gentleman [facility] further evaluation and treatment of pain involving the region of the entire back upper and lower extremity regions. On today's visit patient states he has significant pain involving the region of the right knee. Patient denies trauma change in events of daily living the cause change in symptomatology. Patient states he is in hopes of being able to undergo interventional treatment of the knee in attempt to decrease severity of the symptoms. We discussed patient's condition and will proceed with geniculate nerve blocks of the knee at time return appointment in attempt to decrease severity of symptoms minimize the risk of medication escalation hopefully retard progression of patient's symptoms and avoid the need for more involved treatment we will proceed with geniculate nerve blocks of the knee as discussed and explained to patient on today's visit . The patient was understanding and agreement with suggested treatment plan       Review of Systems     Objective:   Physical Exam   there was tenderness over the splenius capitis of talus region of mild degree with mild tenderness of the cervical facet cervical paraspinal musculature region. Palpation of the acromioclavicular and glenohumeral joint regions reproduced mild discomfort as well. Tinel and Phalen's maneuver were without increased pain of significant degree and there was unremarkable Spurling's maneuver.. Grip strength appeared to be slightly decreased Palpation of the thoracic facet thoracic paraspinal musculature region was associated with moderate discomfort with moderate muscle spasms noted. Of the thoracic region was noted. Outpatient over the lumbar paraspinal musculature region lumbar facet region was associated tends to palpation of moderately severe degree with  lateral bending and rotation limited and with evidence of significant muscle spasms of the lumbar paraspinal musculature region. Straight leg raising was limited to approximately 20 without definite increase of pain with dorsiflexion noted with questionable decreased sensation along the L5 dermatomal distribution. There was moderate tenderness over the PSIS and  PII S regions with negative clonus and negative Homans. DTRs difficult to elicit patient had difficulty relaxing examination of the knee revealed patient to be with crepitus of the knee. There was negative anterior and posterior drawer signs without ballottement of the patella. No increased warmth erythema of the knee noted there was significant crepitus of the knee with severe pain with range of motion maneuvers of the knee abdomen nontender and no costovertebral tenderness noted     Assessment & Plan:     Degenerative disc disease lumbar spine  Generative changes L2-3 L3-L4 L4-5 and L5 S1 spondylosis , multilevel degenerative disc disease, multifactorial spinal and lateral recess stenosis and foraminal stenosis L3-4 and diffuse disc bulging, osteophytic ridging and advanced facet disease and bilateral recess stenosis and right foraminal stenosis, early spinal stenosis at L4-5 and similar changes at L5-S1   Status post lumbar fusion   Degenerative joint disease of knee   Sacroiliitis sacroiliac joint dysfunction   Lumbar facet syndrome   Bilateral occipital neuralgia     plan   Continue present medications   Geniculate nerve blocks of need to be performed at time return appointment.  F/U PCP for evaliation of  BP and general medical  condition.  F/U surgical evaluation.  F/U neurological evaluation.  May consider radiofrequency rhizolysis or intraspinal procedures pending response to present treatment and F/U evaluation.  Patient to  call [facility] should patient have concerns prior to scheduled return  appointment.

## 2014-09-08 ENCOUNTER — Other Ambulatory Visit: Payer: Self-pay | Admitting: Pain Medicine

## 2014-09-09 ENCOUNTER — Ambulatory Visit: Payer: Medicare Other | Admitting: Pain Medicine

## 2014-09-16 ENCOUNTER — Encounter: Payer: Self-pay | Admitting: Pain Medicine

## 2014-09-16 ENCOUNTER — Ambulatory Visit: Payer: Medicare Other | Attending: Pain Medicine | Admitting: Pain Medicine

## 2014-09-16 VITALS — BP 138/57 | HR 73 | Temp 98.0°F | Resp 18 | Ht 68.0 in | Wt 160.0 lb

## 2014-09-16 DIAGNOSIS — M5030 Other cervical disc degeneration, unspecified cervical region: Secondary | ICD-10-CM

## 2014-09-16 DIAGNOSIS — B999 Unspecified infectious disease: Secondary | ICD-10-CM | POA: Diagnosis not present

## 2014-09-16 DIAGNOSIS — M79604 Pain in right leg: Secondary | ICD-10-CM | POA: Diagnosis present

## 2014-09-16 DIAGNOSIS — M533 Sacrococcygeal disorders, not elsewhere classified: Secondary | ICD-10-CM | POA: Diagnosis not present

## 2014-09-16 DIAGNOSIS — M5126 Other intervertebral disc displacement, lumbar region: Secondary | ICD-10-CM | POA: Insufficient documentation

## 2014-09-16 DIAGNOSIS — M545 Low back pain: Secondary | ICD-10-CM | POA: Diagnosis present

## 2014-09-16 DIAGNOSIS — M47817 Spondylosis without myelopathy or radiculopathy, lumbosacral region: Secondary | ICD-10-CM

## 2014-09-16 DIAGNOSIS — M4806 Spinal stenosis, lumbar region: Secondary | ICD-10-CM | POA: Diagnosis not present

## 2014-09-16 DIAGNOSIS — M179 Osteoarthritis of knee, unspecified: Secondary | ICD-10-CM | POA: Insufficient documentation

## 2014-09-16 DIAGNOSIS — M79605 Pain in left leg: Secondary | ICD-10-CM | POA: Diagnosis present

## 2014-09-16 DIAGNOSIS — M170 Bilateral primary osteoarthritis of knee: Secondary | ICD-10-CM | POA: Diagnosis not present

## 2014-09-16 DIAGNOSIS — M48062 Spinal stenosis, lumbar region with neurogenic claudication: Secondary | ICD-10-CM

## 2014-09-16 DIAGNOSIS — M47816 Spondylosis without myelopathy or radiculopathy, lumbar region: Secondary | ICD-10-CM | POA: Insufficient documentation

## 2014-09-16 DIAGNOSIS — M5134 Other intervertebral disc degeneration, thoracic region: Secondary | ICD-10-CM

## 2014-09-16 DIAGNOSIS — M5136 Other intervertebral disc degeneration, lumbar region: Secondary | ICD-10-CM | POA: Diagnosis not present

## 2014-09-16 DIAGNOSIS — M1710 Unilateral primary osteoarthritis, unspecified knee: Secondary | ICD-10-CM | POA: Insufficient documentation

## 2014-09-16 MED ORDER — CIPROFLOXACIN HCL 500 MG PO TABS: ORAL_TABLET | ORAL | Status: DC

## 2014-09-16 MED ORDER — DOXYCYCLINE HYCLATE 100 MG PO TABS: 100.0000 mg | ORAL_TABLET | Freq: Two times a day (BID) | ORAL | Status: DC

## 2014-09-16 NOTE — Progress Notes (Signed)
Subjective:    Patient ID: Nathan Kramer, male    DOB: 14-Jun-1935, 79 y.o.   MRN: [id number]  HPI  Patient is 79 year old gentleman who [facility] for further evaluation and treatment of pain involving the lower back and lower extremity regions. Patient was scheduled for geniculate nerve blocks of the right knee. Examination of the patient revealed patient to be with lesions of the right lower extremity as well as the left lower extremity. Due to the lesions of the lower extremities the procedure, geniculate nerve blocks to the knee, was counseled. Patient was given prescription for Cipro and scheduled for follow-up evaluation with his primary care physician Dr.Hande and wound care clinic. Patient denied history of chills or fevers malaise. We advised patient to call [facility] should there be change in condition and we will consider further modifications of treatment regimen as discussed with patient on today's visit. The patient was understanding and agreement with suggested treatment plan.  Review of Systems     Objective:   Physical Exam  There was tenderness over the splenius capitis and occipitalis musculature regions of moderate degree with limited range of motion of the cervical spine. There was tenderness over the cervical facet cervical paraspinal musculature region of moderate degree. There appeared to be unremarkable Spurling's maneuver. Patient appeared to be with slightly decreased grip strength and Tinel and Phalen's maneuver were without increased pain of mild to moderate degree. Palpation of the thoracic facet thoracic paraspinal musculature region was a tends to palpation of mild to moderate degree and no crepitus of the thoracic region noted. Palpation of the lumbar paraspinal musculature region lumbar facet region was a tends to palpation of moderate degree with extension and palpation of the lumbar facets reproducing moderate to moderately  severe discomfort. There was moderate tenderness over the PSIS and PII S regions of moderate degree as well. Straight leg raising limited to 20 without increase of pain with dorsiflexion noted. There was decreased sensation along the L 5 dermatomal. There was negative clonus negative Homans. There was tenderness to palpation of the right knee without increased warmth and erythema of the knee and no ballottement of the patella noted. Lower extremity revealed patient to be with circular lesions of the lower extremity with erythema and with slight crusting of lesions. There was no apparent increased warmth and erythema of the lower extremities noted and no excessive swelling of the lower extremities were noted. Abdomen was nontender and no costovertebral angle tenderness was noted.      Assessment & Plan:  Degenerative disc disease lumbar spine L2-3, L3-4, L4-5, and L5-S1 degenerative changes with lumbar spondylosis multilevel degenerative disc disease, multifactorial spinal and lateral recess stenosis and foraminal stenosis L3-4 diffuse disc bulging, osteophytic multifactorial spinal stenosis and lateral recess stenosis and foraminal stenosis, L3-4 diffuse disc bulging and osteophytic ridging and advanced facet disease with bilateral recess stenosis.  Degenerative joint disease of knees  Sacroiliac joint dysfunction  Degenerative disc disease cervical spine  Infection of the lower extremities(must consider MRSA and other processes)       Plan   Continue present medications. Morphine sulfate extended-release and hydrocodone acetaminophen. Patient was also given prescription for Cipro for treatment of lower extremity lesions and patient was scheduled for further evaluation with Dr. Ginette Pitman and scheduled for wound care clinic evaluation of the lower extremity lesions  F/U PCP for evaliation of  BP and general medical  condition. Follow-up with Dr.Hande for evaluation of lower  extremity lesions and  general medical condition  Wound care clinic evaluation of lower extremities. Patient was scheduled for wound care clinic evaluation today  F/U surgical evaluation.  F/U neurological evaluation.  May consider radiofrequency rhizolysis or intraspinal procedures pending response to present treatment and F/U evaluation.  Patient to call [facility] should patient have concerns prior to scheduled return appointment.

## 2014-09-16 NOTE — Patient Instructions (Addendum)
Continue present medications and antibiotics. Please obtain your antibiotic today and begin taking antibiotic today  F/U PCP for evaliation of  BP and general medical  condition. Please see Dr. Ginette Pitman today for further evaluation of lower extremity lesions  We will schedule appointment for you in the wound care clinic at this time and consider infectious disease evaluation as well  F/U surgical evaluation as discussed  F/U neurological evaluation.  May consider radiofrequency rhizolysis or intraspinal procedures pending response to present treatment and F/U evaluation.  Patient to call [facility] should patient have concerns prior to scheduled return appointment.

## 2014-09-16 NOTE — Progress Notes (Signed)
Discharged to home ambulatory.  Did not get procedure today because of sores on his legs.  Informed to see pcp per Dr Primus Bravo.  Script for Cipro at pharmacy.

## 2014-09-16 NOTE — Progress Notes (Signed)
Safety precautions to be maintained throughout the outpatient stay will include: orient to surroundings, keep bed in low position, maintain call bell within reach at all times, provide assistance with transfer out of bed and ambulation.  

## 2014-10-01 ENCOUNTER — Encounter: Payer: Self-pay | Admitting: Pain Medicine

## 2014-10-01 ENCOUNTER — Ambulatory Visit: Payer: Medicare Other | Attending: Pain Medicine | Admitting: Pain Medicine

## 2014-10-01 VITALS — BP 116/70 | HR 75 | Temp 96.1°F | Resp 18 | Ht 68.0 in | Wt 157.0 lb

## 2014-10-01 DIAGNOSIS — M5126 Other intervertebral disc displacement, lumbar region: Secondary | ICD-10-CM | POA: Diagnosis not present

## 2014-10-01 DIAGNOSIS — R51 Headache: Secondary | ICD-10-CM | POA: Diagnosis not present

## 2014-10-01 DIAGNOSIS — M79604 Pain in right leg: Secondary | ICD-10-CM | POA: Diagnosis present

## 2014-10-01 DIAGNOSIS — M79605 Pain in left leg: Secondary | ICD-10-CM | POA: Diagnosis present

## 2014-10-01 DIAGNOSIS — M4807 Spinal stenosis, lumbosacral region: Secondary | ICD-10-CM | POA: Diagnosis not present

## 2014-10-01 DIAGNOSIS — M5481 Occipital neuralgia: Secondary | ICD-10-CM | POA: Diagnosis not present

## 2014-10-01 DIAGNOSIS — M4806 Spinal stenosis, lumbar region: Secondary | ICD-10-CM | POA: Insufficient documentation

## 2014-10-01 DIAGNOSIS — M179 Osteoarthritis of knee, unspecified: Secondary | ICD-10-CM | POA: Diagnosis not present

## 2014-10-01 DIAGNOSIS — M51369 Other intervertebral disc degeneration, lumbar region without mention of lumbar back pain or lower extremity pain: Secondary | ICD-10-CM

## 2014-10-01 DIAGNOSIS — M48062 Spinal stenosis, lumbar region with neurogenic claudication: Secondary | ICD-10-CM

## 2014-10-01 DIAGNOSIS — M5030 Other cervical disc degeneration, unspecified cervical region: Secondary | ICD-10-CM | POA: Diagnosis not present

## 2014-10-01 DIAGNOSIS — M47817 Spondylosis without myelopathy or radiculopathy, lumbosacral region: Secondary | ICD-10-CM

## 2014-10-01 DIAGNOSIS — M47816 Spondylosis without myelopathy or radiculopathy, lumbar region: Secondary | ICD-10-CM | POA: Insufficient documentation

## 2014-10-01 DIAGNOSIS — M170 Bilateral primary osteoarthritis of knee: Secondary | ICD-10-CM

## 2014-10-01 DIAGNOSIS — M5136 Other intervertebral disc degeneration, lumbar region: Secondary | ICD-10-CM

## 2014-10-01 DIAGNOSIS — M545 Low back pain: Secondary | ICD-10-CM | POA: Diagnosis present

## 2014-10-01 DIAGNOSIS — M5134 Other intervertebral disc degeneration, thoracic region: Secondary | ICD-10-CM

## 2014-10-01 MED ORDER — HYDROCODONE-ACETAMINOPHEN 10-325 MG PO TABS: ORAL_TABLET | ORAL | Status: DC

## 2014-10-01 MED ORDER — MORPHINE SULFATE 30 MG PO TABS: ORAL_TABLET | ORAL | Status: DC

## 2014-10-01 NOTE — Progress Notes (Signed)
Safety precautions to be maintained throughout the outpatient stay will include: orient to surroundings, keep bed in low position, maintain call bell within reach at all times, provide assistance with transfer out of bed and ambulation.  

## 2014-10-01 NOTE — Patient Instructions (Addendum)
Continue present medications hydrocodone acetaminophen and morphine  F/U PCP for evaliation of  BP and general medical  condition.  F/U surgical evaluation  F/U neurological evaluation  May consider radiofrequency rhizolysis or intraspinal procedures pending response to present treatment and F/U evaluation.  Patient to call [facility] should patient have concerns prior to scheduled return appointment.  You were given scripts for Hydrocodone and Morphine today.

## 2014-10-01 NOTE — Progress Notes (Signed)
   Subjective:    Patient ID: Nathan Kramer, male    DOB: Jan 06, 1936, 79 y.o.   MRN: [id number]  HPI  Patient is 79 year old gentleman returns to pain management for follow-up evaluation treatment of pain involving the lower back lower extremity region predominantly pain involving the region of the neck as well as the knees. We will and to perform geniculate nerve blocks of the knee at time of patient's previous visits to [facility]. The procedure was counseled due to the patient being with lesions of the lower extremity. On today's visit we discussed patient's condition and will continue to avoid interventional treatment at this time. Patient's lesions of the lower extremity have apparently resolved. Patient is with history of degenerative joint disease of the knees and present time we will consider patient for geniculate nerve blocks of the knee should there be persistent pain of the knees despite the present treatment. Patient denies any trauma change in events of daily living the call significant change in symptomatology. At the present time we will continue patient's morphine extended release with hydrocodone acetaminophen for breakthrough pain. All understanding and agreement status treatment plan.     Review of Systems     Objective:   Physical Exam  There is tenderness of the spinous And occipitalis region of mild degree. Palpation of the cervical facet cervical paraspinal musculature region reproduced pain of mild degree. There was mild tenderness of the acromial clavicular glenohumeral joint region. There was unremarkable Spurling's maneuver. Tinel and Phalen's maneuver were without increase of pain of significant degree. Palpation of the thoracic facet thoracic paraspinal muscles region was with moderate tends to palpation. Palpation over the lumbar paraspinal muscles region lumbar facet region was with moderate tends to palpation with lateral bending rotation extension and  palpation of the lumbar facets reproducing moderate discomfort. There was moderate tenderness of the PSIS PII S region gluteal and piriformis musculature regions. Straight leg raising tolerates approximately 20 without increased pain with dorsiflexion noted. There was negative clonus negative Homans. No sensory deficit of dermatomal disputed detected. Mild tenderness of the greater trochanteric region iliotibial band region. Examination of the knee with the patient to be with negative anterior posterior drawer signs without ballottement of the patella. There was significant crepitus of the knee and increased pain of the knee with range of motion maneuvers of the knee. Abdomen nontender and no costovertebral tenderness was noted.    Assessment & Plan:  Degenerative disc disease lumbar spine L2-3 L3-4 L4-5 L5-S1 degenerative changes with lumbar spondylosis with multilevel degenerative disc disease lumbar spine, multifactorial spinal and lateral recess stenosis and foraminal stenosis L3-4 diffuse disc bulging, osteophytic multifactorial spinal stenosis and lateral recess stenosis and foraminal stenosis spinal stenosis L4-L5 and similar changes L5-S1  Sacroiliac joint dysfunction  Degenerative joint disease of knee  Degenerative disc disease cervical spine  Cervicogenic headaches  Greater occipital neuralgia    Plan    Continue present medications hydrocodone acetaminophen and morphine  F/U PCP for evaliation of  BP and general medical  condition.  F/U surgical evaluation  F/U neurological evaluation  May consider radiofrequency rhizolysis or intraspinal procedures pending response to present treatment and F/U evaluation.  Patient to call [facility] should patient have concerns prior to scheduled return appointment.

## 2014-10-06 ENCOUNTER — Telehealth: Payer: Self-pay | Admitting: Pain Medicine

## 2014-10-06 ENCOUNTER — Other Ambulatory Visit: Payer: Self-pay | Admitting: Pain Medicine

## 2014-10-06 MED ORDER — MORPHINE SULFATE ER 30 MG PO TBCR: EXTENDED_RELEASE_TABLET | ORAL | Status: DC

## 2014-10-06 NOTE — Telephone Encounter (Signed)
Pt script was written wrong last week and he was not able to get it filled. Pt is out of meds until script is fixed.

## 2014-10-14 DIAGNOSIS — D539 Nutritional anemia, unspecified: Secondary | ICD-10-CM | POA: Insufficient documentation

## 2014-10-29 ENCOUNTER — Encounter: Payer: Self-pay | Admitting: Pain Medicine

## 2014-10-29 ENCOUNTER — Ambulatory Visit: Payer: Medicare Other | Attending: Pain Medicine | Admitting: Pain Medicine

## 2014-10-29 VITALS — BP 128/65 | HR 80 | Temp 97.6°F | Resp 16 | Ht 68.0 in | Wt 155.0 lb

## 2014-10-29 DIAGNOSIS — M5030 Other cervical disc degeneration, unspecified cervical region: Secondary | ICD-10-CM | POA: Diagnosis not present

## 2014-10-29 DIAGNOSIS — M47816 Spondylosis without myelopathy or radiculopathy, lumbar region: Secondary | ICD-10-CM | POA: Insufficient documentation

## 2014-10-29 DIAGNOSIS — M533 Sacrococcygeal disorders, not elsewhere classified: Secondary | ICD-10-CM

## 2014-10-29 DIAGNOSIS — M5481 Occipital neuralgia: Secondary | ICD-10-CM | POA: Diagnosis not present

## 2014-10-29 DIAGNOSIS — M5136 Other intervertebral disc degeneration, lumbar region: Secondary | ICD-10-CM | POA: Insufficient documentation

## 2014-10-29 DIAGNOSIS — M79604 Pain in right leg: Secondary | ICD-10-CM | POA: Diagnosis present

## 2014-10-29 DIAGNOSIS — M48062 Spinal stenosis, lumbar region with neurogenic claudication: Secondary | ICD-10-CM

## 2014-10-29 DIAGNOSIS — M47817 Spondylosis without myelopathy or radiculopathy, lumbosacral region: Secondary | ICD-10-CM

## 2014-10-29 DIAGNOSIS — M5134 Other intervertebral disc degeneration, thoracic region: Secondary | ICD-10-CM

## 2014-10-29 DIAGNOSIS — M549 Dorsalgia, unspecified: Secondary | ICD-10-CM | POA: Diagnosis present

## 2014-10-29 DIAGNOSIS — R51 Headache: Secondary | ICD-10-CM | POA: Insufficient documentation

## 2014-10-29 DIAGNOSIS — M170 Bilateral primary osteoarthritis of knee: Secondary | ICD-10-CM

## 2014-10-29 DIAGNOSIS — M179 Osteoarthritis of knee, unspecified: Secondary | ICD-10-CM | POA: Insufficient documentation

## 2014-10-29 DIAGNOSIS — M79605 Pain in left leg: Secondary | ICD-10-CM | POA: Diagnosis present

## 2014-10-29 DIAGNOSIS — M4806 Spinal stenosis, lumbar region: Secondary | ICD-10-CM | POA: Diagnosis not present

## 2014-10-29 MED ORDER — HYDROCODONE-ACETAMINOPHEN 10-325 MG PO TABS: ORAL_TABLET | ORAL | Status: DC

## 2014-10-29 MED ORDER — MORPHINE SULFATE ER 30 MG PO TBCR: EXTENDED_RELEASE_TABLET | ORAL | Status: DC

## 2014-10-29 NOTE — Progress Notes (Signed)
   Subjective:    Patient ID: Nathan Kramer, male    DOB: 11/24/1935, 79 y.o.   MRN: [id number]  HPI  Patient 79 year old gentleman returns to [facility] for further evaluation and treatment of pain involving the neck and entire back upper and lower extremity region. Patient tolerating morphine extended release and hydrocodone acetaminophen fairly well at this time. We discussed performing radiofrequency procedures and will avoid since treatment at this time since patient continues to be with pain fairly well-controlled. We will also discussed injection of the knees and we'll avoid injection of the knees at this time as well. The patient was understanding and agrees suggested treatment plan. The patient will continue morphine sulfate extended release and hydrocodone acetaminophen at this time.      Review of Systems     Objective:   Physical Exam  There was moderate tends to palpation of the splenius capitis and occipitalis musculature region. Palpation of the cervical facet cervical paraspinal muscles reproduced pain of moderate degree. There was moderate tenderness over the thoracic facet thoracic paraspinal muscles. Patient appeared to be with slightly decreased grip strength. Tinel and Phalen's maneuver without increased pain of any significant degree. There appeared to be unremarkable Spurling's maneuver. Outpatient over the region of the lumbar paraspinal muscles lumbar facet region was a tends to palpation of moderate degree with extension and palpation of the lumbar facets reproducing moderately severe discomfort. There was limited straight leg raising to 20 without increased pain with dorsiflexion noted. There was moderate to moderately severe tenderness over the PSIS and PII S region. There was questionable decreased sensation of the L5 dermatomal distribution detected. There was negative clonus negative Homans. Abdomen was nontender with no costovertebral angle tenderness  noted.      Assessment & Plan:   Degenerative disc disease lumbar spine L2-3 L3-4 L4-5 L5-S1 degenerative changes with lumbar spondylosis with multilevel degenerative disc disease lumbar spine, multifactorial spinal and lateral recess stenosis and foraminal stenosis L3-4 diffuse disc bulging, osteophytic multifactorial spinal stenosis and lateral recess stenosis and foraminal stenosis spinal stenosis L4-L5 and similar changes L5-S1  Sacroiliac joint dysfunction  Degenerative joint disease of knee  Degenerative disc disease cervical spine  Cervicogenic headaches  Greater occipital neuralgia   Plan    Continue present medications hydrocodone acetaminophen and morphine  F/U PCP Dr. Ginette Pitman  for evaliation of  BP and general medical  condition.  F/U surgical evaluation  F/U neurological evaluation  May consider radiofrequency rhizolysis or intraspinal procedures pending response to present treatment and F/U evaluation.  Patient to call [facility] should patient have concerns prior to scheduled return appointment.

## 2014-10-29 NOTE — Patient Instructions (Addendum)
Continue present medications morphine and hydrocodone acetaminophen   F/U PCP Dr.Hande  for evaliation of  BP and general medical  condition.  F/U surgical evaluation  F/U neurological evaluation  May consider radiofrequency rhizolysis or intraspinal procedures pending response to present treatment and F/U evaluation.  Patient to call [facility] should patient have concerns prior to scheduled return appointment.  You were given scripts for Hydrocodone and Morphine today.

## 2014-10-29 NOTE — Progress Notes (Signed)
Safety precautions to be maintained throughout the outpatient stay will include: orient to surroundings, keep bed in low position, maintain call bell within reach at all times, provide assistance with transfer out of bed and ambulation.  

## 2014-12-01 ENCOUNTER — Ambulatory Visit: Payer: Medicare Other | Attending: Pain Medicine | Admitting: Pain Medicine

## 2014-12-01 ENCOUNTER — Encounter: Payer: Medicare Other | Admitting: Pain Medicine

## 2014-12-01 ENCOUNTER — Encounter: Payer: Self-pay | Admitting: Pain Medicine

## 2014-12-01 VITALS — BP 144/51 | HR 66 | Temp 97.9°F | Resp 18 | Ht 68.0 in | Wt 158.0 lb

## 2014-12-01 DIAGNOSIS — M4806 Spinal stenosis, lumbar region: Secondary | ICD-10-CM | POA: Insufficient documentation

## 2014-12-01 DIAGNOSIS — M533 Sacrococcygeal disorders, not elsewhere classified: Secondary | ICD-10-CM | POA: Insufficient documentation

## 2014-12-01 DIAGNOSIS — M5030 Other cervical disc degeneration, unspecified cervical region: Secondary | ICD-10-CM | POA: Diagnosis not present

## 2014-12-01 DIAGNOSIS — M545 Low back pain: Secondary | ICD-10-CM | POA: Diagnosis present

## 2014-12-01 DIAGNOSIS — M179 Osteoarthritis of knee, unspecified: Secondary | ICD-10-CM | POA: Diagnosis not present

## 2014-12-01 DIAGNOSIS — R51 Headache: Secondary | ICD-10-CM | POA: Diagnosis not present

## 2014-12-01 DIAGNOSIS — M47816 Spondylosis without myelopathy or radiculopathy, lumbar region: Secondary | ICD-10-CM | POA: Insufficient documentation

## 2014-12-01 DIAGNOSIS — M5136 Other intervertebral disc degeneration, lumbar region: Secondary | ICD-10-CM | POA: Diagnosis not present

## 2014-12-01 DIAGNOSIS — M5134 Other intervertebral disc degeneration, thoracic region: Secondary | ICD-10-CM

## 2014-12-01 DIAGNOSIS — M47817 Spondylosis without myelopathy or radiculopathy, lumbosacral region: Secondary | ICD-10-CM

## 2014-12-01 DIAGNOSIS — M48062 Spinal stenosis, lumbar region with neurogenic claudication: Secondary | ICD-10-CM

## 2014-12-01 DIAGNOSIS — M5126 Other intervertebral disc displacement, lumbar region: Secondary | ICD-10-CM | POA: Diagnosis not present

## 2014-12-01 DIAGNOSIS — M5481 Occipital neuralgia: Secondary | ICD-10-CM | POA: Insufficient documentation

## 2014-12-01 DIAGNOSIS — M170 Bilateral primary osteoarthritis of knee: Secondary | ICD-10-CM

## 2014-12-01 MED ORDER — HYDROCODONE-ACETAMINOPHEN 10-325 MG PO TABS: ORAL_TABLET | ORAL | Status: DC

## 2014-12-01 MED ORDER — MORPHINE SULFATE ER 30 MG PO TBCR: EXTENDED_RELEASE_TABLET | ORAL | Status: DC

## 2014-12-01 NOTE — Progress Notes (Signed)
   Subjective:    Patient ID: Nathan Kramer, male    DOB: November 16, 1935, 79 y.o.   MRN: [id number]  HPI  Patient is 79 year old gentleman who returns to [facility] for further evaluation and treatment of pain involving the region of the lower back lower extremity region predominantly with pain involving the region of the knees as well as pain involving the neck of lesser degree. Patient denies any recent trauma change in events of daily living the call significant change in symptomatology patient did admit to some weakness of extremities which is aggravated by standing and walking with weakness becoming more intense as patient spends more time on the feet. We discussed interventional treatment and have also discuss surgical reevaluation. Patient will call to inform us of when he wishes to proceed with interventional treatment and/or surgical evaluation. Patient prefers to delay both at this time. We will continue medications hydrocodone acetaminophen and morphine sulfate extended-release at this time and will remain available to consider additional modification of treatment regimen as discussed with patient.   Review of Systems     Objective:   Physical Exam There was tenderness of the splenius capitis and occipitalis musculature region of moderate degree. Palpation of the trapezius levator scapula and rhomboid musculature regions reproduced moderate discomfort. There was unremarkable Spurling's maneuver and patient appeared to be with slightly decreased grip strength. Tinel and Phalen's maneuver were without definite significant increase of pain. Outpatient over the thoracic facet thoracic paraspinal musculature region was with discomfort of moderate degree palpation over the cervical paraspinal muscles and cervical facet region was with moderate discomfort as well patient was with tenderness to palpation over the lumbar paraspinal musculature region lumbar facet region of moderately severe  degree. Lateral bending and rotation and extension and palpation of the lumbar facets reproduce moderately severe discomfort. Straight leg raising was limited to approximately 20 without a definite increased pain dorsiflexion noted. There appeared to be negative clonus negative Homans. DTRs difficult to elicit patient did have difficulty relaxing there was crepitus of the knees noted with range of motion maneuvers of the knees with negative anterior and posterior drawer signs without ballottement of the patella there was negative clonus negative Homans mild tenderness of the greater trochanteric region and iliotibial band region. The abdomen was nontender and no costovertebral angle tenderness was noted.       Assessment & Plan:    Degenerative disc disease lumbar spine L2-3 L3-4 L4-5 L5-S1 degenerative changes with lumbar spondylosis with multilevel degenerative disc disease lumbar spine, multifactorial spinal and lateral recess stenosis and foraminal stenosis L3-4 diffuse disc bulging, osteophytic multifactorial spinal stenosis and lateral recess stenosis and foraminal stenosis spinal stenosis L4-L5 and similar changes L5-S1  Sacroiliac joint dysfunction  Degenerative joint disease of knee  Degenerative disc disease cervical spine  Cervicogenic headaches  Greater occipital neuralgia   Plan   Continue present medications hydrocodone acetaminophen and morphine sulfate extended-release  F/U PCP Dr. Ginette Pitman b for evaliation of  BP and general medical  condition  F/U surgical evaluation as needed  F/U neurological evaluation as needed  May consider radiofrequency rhizolysis or intraspinal procedures pending response to present treatment and F/U evaluation   Patient to call [facility] should patient have concerns prior to scheduled return appointment.

## 2014-12-01 NOTE — Progress Notes (Signed)
Safety precautions to be maintained throughout the outpatient stay will include: orient to surroundings, keep bed in low position, maintain call bell within reach at all times, provide assistance with transfer out of bed and ambulation.  

## 2014-12-01 NOTE — Patient Instructions (Addendum)
Continue present medicationsHydrocodone acetaminophen and morphine sulfate extended release  F/U PCP Dr.Hande  for evaliation of  BP and general medical  condition  F/U surgical evaluation  F/U neurological evaluation  May consider radiofrequency rhizolysis or intraspinal procedures pending response to present treatment and F/U evaluation   Patient to call [facility] should patient have concerns prior to scheduled return appointment.

## 2014-12-10 ENCOUNTER — Other Ambulatory Visit: Payer: Self-pay | Admitting: Pain Medicine

## 2014-12-28 ENCOUNTER — Ambulatory Visit: Payer: Medicare Other | Admitting: Pain Medicine

## 2014-12-28 ENCOUNTER — Other Ambulatory Visit: Payer: Self-pay | Admitting: Pain Medicine

## 2014-12-31 ENCOUNTER — Ambulatory Visit: Payer: Medicare Other | Admitting: Pain Medicine

## 2014-12-31 ENCOUNTER — Ambulatory Visit: Payer: Medicare Other | Attending: Pain Medicine | Admitting: Pain Medicine

## 2014-12-31 ENCOUNTER — Encounter: Payer: Self-pay | Admitting: Pain Medicine

## 2014-12-31 VITALS — BP 131/50 | HR 72 | Temp 97.7°F | Resp 16 | Ht 68.0 in | Wt 155.0 lb

## 2014-12-31 DIAGNOSIS — M4806 Spinal stenosis, lumbar region: Secondary | ICD-10-CM | POA: Diagnosis not present

## 2014-12-31 DIAGNOSIS — M5134 Other intervertebral disc degeneration, thoracic region: Secondary | ICD-10-CM

## 2014-12-31 DIAGNOSIS — M179 Osteoarthritis of knee, unspecified: Secondary | ICD-10-CM | POA: Insufficient documentation

## 2014-12-31 DIAGNOSIS — M5030 Other cervical disc degeneration, unspecified cervical region: Secondary | ICD-10-CM | POA: Diagnosis not present

## 2014-12-31 DIAGNOSIS — M47816 Spondylosis without myelopathy or radiculopathy, lumbar region: Secondary | ICD-10-CM | POA: Insufficient documentation

## 2014-12-31 DIAGNOSIS — M5136 Other intervertebral disc degeneration, lumbar region: Secondary | ICD-10-CM | POA: Diagnosis not present

## 2014-12-31 DIAGNOSIS — M47817 Spondylosis without myelopathy or radiculopathy, lumbosacral region: Secondary | ICD-10-CM

## 2014-12-31 DIAGNOSIS — M170 Bilateral primary osteoarthritis of knee: Secondary | ICD-10-CM

## 2014-12-31 DIAGNOSIS — M5481 Occipital neuralgia: Secondary | ICD-10-CM | POA: Diagnosis not present

## 2014-12-31 DIAGNOSIS — R51 Headache: Secondary | ICD-10-CM | POA: Insufficient documentation

## 2014-12-31 DIAGNOSIS — M533 Sacrococcygeal disorders, not elsewhere classified: Secondary | ICD-10-CM | POA: Diagnosis not present

## 2014-12-31 DIAGNOSIS — M545 Low back pain: Secondary | ICD-10-CM | POA: Diagnosis present

## 2014-12-31 DIAGNOSIS — M48062 Spinal stenosis, lumbar region with neurogenic claudication: Secondary | ICD-10-CM

## 2014-12-31 MED ORDER — HYDROCODONE-ACETAMINOPHEN 10-325 MG PO TABS: ORAL_TABLET | ORAL | Status: DC

## 2014-12-31 MED ORDER — MORPHINE SULFATE ER 30 MG PO TBCR: EXTENDED_RELEASE_TABLET | ORAL | Status: DC

## 2014-12-31 NOTE — Progress Notes (Signed)
   Subjective:    Patient ID: Nathan Kramer, male    DOB: [birth year], 79 y.o.   MRN: [id number]  HPI  Patient is 79 year old gentleman returns to [facility] for further evaluation and treatment of pain involving the lower back lower extremity region predominantly as well as the neck and upper extremity regions. Patient also has had pain involving the knees . At the present time patient states that the pain is fairly well-controlled with morphine sulfate extended release and hydrocodone acetaminophen. We will avoid interventional treatment and will continue presently prescribed medications. The patient is understanding and in agreement with suggested treatment plan.    Review of Systems     Objective:   Physical Exam  There was tenderness of the splenius capitis and occipitalis musculature region of mild to moderate degree. Palpation over the cervical facet cervical paraspinal musculature regions reproduced  tenderness to palpation of mild to moderate degree and palpation over the thoracic facet thoracic paraspinal muscles region was a tends to palpation of mild to moderate degree. There was no crepitus of the thoracic region noted. Patient appeared to be with bilaterally equal grip strength. Tinel and Phalen's maneuver were without increased pain of significant degree. There was unremarkable Spurling's maneuver. Palpation over the region of the lumbar paraspinal musculature region lumbar facet region was a tends to palpation of moderate moderately severe degree. Lateral bending and rotation and extension and palpation of the lumbar facets reproduce moderate the superior discomfort. Straight leg raising was limited approximately 20 without increased pain with dorsiflexion noted. Patient unable to stand on tiptoes and heels. Palpation over the PSIS and PII S region associated with moderately severe discomfort. No definite sensory deficit of dermatomal distribution was detected. There was  negative clonus negative Homans. DTRs were difficult to elicit patient had difficulty relaxing. Abdomen was nontender with no costovertebral angle tenderness noted.      Assessment & Plan:     Degenerative disc disease lumbar spine L2-3 L3-4 L4-5 L5-S1 degenerative changes with lumbar spondylosis with multilevel degenerative disc disease lumbar spine, multifactorial spinal and lateral recess stenosis and foraminal stenosis L3-4 diffuse disc bulging, osteophytic multifactorial spinal stenosis and lateral recess stenosis and foraminal stenosis spinal stenosis L4-L5 and similar changes L5-S1  Sacroiliac joint dysfunction  Degenerative joint disease of knee  Degenerative disc disease cervical spine  Cervicogenic headaches  Greater occipital neuralgia    PLAN   Continue present medication morphine sulfate extended release and hydrocodone acetaminophen  F/U PCP Dr Ginette Pitman   for evaliation of  BP and general medical  condition  F/U surgical evaluation. May consider pending follow-up evaluations  F/U neurological evaluation. May consider pending follow-up evaluations  May consider radiofrequency rhizolysis or intraspinal procedures pending response to present treatment and F/U evaluation   Patient to call [facility] should patient have concerns prior to scheduled return appointment.

## 2014-12-31 NOTE — Patient Instructions (Signed)
PLAN   Continue present medication hydrocodone acetaminophen and MS Contin(morphine sulfate extended release)  F/U PCP Dr.Hande  for evaliation of  BP and general medical  condition  F/U surgical evaluation. May consider pending follow-up evaluations  F/U neurological evaluation. May consider pending follow-up evaluations  May consider radiofrequency rhizolysis or intraspinal procedures pending response to present treatment and F/U evaluation   Patient to call [facility] should patient have concerns prior to scheduled return appointment.

## 2015-01-26 ENCOUNTER — Ambulatory Visit: Payer: Medicare Other | Attending: Pain Medicine | Admitting: Pain Medicine

## 2015-01-26 ENCOUNTER — Encounter: Payer: Self-pay | Admitting: Pain Medicine

## 2015-01-26 VITALS — BP 143/52 | HR 72 | Temp 97.6°F | Resp 14 | Ht 68.0 in | Wt 160.0 lb

## 2015-01-26 DIAGNOSIS — M5481 Occipital neuralgia: Secondary | ICD-10-CM | POA: Diagnosis not present

## 2015-01-26 DIAGNOSIS — M179 Osteoarthritis of knee, unspecified: Secondary | ICD-10-CM | POA: Insufficient documentation

## 2015-01-26 DIAGNOSIS — M5136 Other intervertebral disc degeneration, lumbar region: Secondary | ICD-10-CM | POA: Diagnosis not present

## 2015-01-26 DIAGNOSIS — M5134 Other intervertebral disc degeneration, thoracic region: Secondary | ICD-10-CM

## 2015-01-26 DIAGNOSIS — M4806 Spinal stenosis, lumbar region: Secondary | ICD-10-CM | POA: Insufficient documentation

## 2015-01-26 DIAGNOSIS — R51 Headache: Secondary | ICD-10-CM | POA: Diagnosis not present

## 2015-01-26 DIAGNOSIS — M170 Bilateral primary osteoarthritis of knee: Secondary | ICD-10-CM

## 2015-01-26 DIAGNOSIS — M5126 Other intervertebral disc displacement, lumbar region: Secondary | ICD-10-CM | POA: Insufficient documentation

## 2015-01-26 DIAGNOSIS — M19012 Primary osteoarthritis, left shoulder: Secondary | ICD-10-CM | POA: Diagnosis not present

## 2015-01-26 DIAGNOSIS — M5030 Other cervical disc degeneration, unspecified cervical region: Secondary | ICD-10-CM

## 2015-01-26 DIAGNOSIS — M19011 Primary osteoarthritis, right shoulder: Secondary | ICD-10-CM | POA: Diagnosis not present

## 2015-01-26 DIAGNOSIS — M47817 Spondylosis without myelopathy or radiculopathy, lumbosacral region: Secondary | ICD-10-CM

## 2015-01-26 DIAGNOSIS — M48062 Spinal stenosis, lumbar region with neurogenic claudication: Secondary | ICD-10-CM

## 2015-01-26 DIAGNOSIS — M533 Sacrococcygeal disorders, not elsewhere classified: Secondary | ICD-10-CM

## 2015-01-26 DIAGNOSIS — M4807 Spinal stenosis, lumbosacral region: Secondary | ICD-10-CM | POA: Insufficient documentation

## 2015-01-26 MED ORDER — HYDROCODONE-ACETAMINOPHEN 10-325 MG PO TABS: ORAL_TABLET | ORAL | Status: DC

## 2015-01-26 MED ORDER — MORPHINE SULFATE ER 30 MG PO TBCR: EXTENDED_RELEASE_TABLET | ORAL | Status: DC

## 2015-01-26 NOTE — Progress Notes (Signed)
Subjective:    Patient ID: Nathan Kramer, male    DOB: 04/04/1935, 79 y.o.   MRN: [id number]  HPI  Patient 79 year old gentleman who returns to [facility] for further evaluation and treatment of pain involving the region of the neck upper extremity regions as well as the lower back and lower extremity regions the patient admitted to pain involving the left shoulder. We discussed interventional treatment of pain involving the left shoulder as well as surgical evaluation. Patient prefers to avoid both surgical evaluation and interventional treatment of the left shoulder at this time. We will remain available to consider modification of treatment pending response to treatment and follow-up evaluation. We will continue morphine sulfate extended-release and hydrocodone acetaminophen and we will consider additional modification of treatment pending follow-up evaluation. The patient was understanding and in agreement status treatment plan      . Review of Systems     Objective:   Physical Exam   There was tennis over the splenius capitis and occipitalis region of mild to moderate degree. There appeared to be unremarkable Spurling's maneuver. Patient appeared to be with slightly decreased grip strength. Tinel and Phalen's maneuver were without increased pain of significant degree. There was tends to palpation over the thoracic facet thoracic paraspinal musculature region without crepitus of the thoracic region noted. Palpation of the acromioclavicular and glenohumeral joint region was a tends to palpation on the left of moderate to moderately severe degree. Patient was with mild difficulty performing the drop test. Palpation over the lumbar paraspinal muscles lumbar facet region associated with moderately severe discomfort. Lateral bending and rotation and extension and palpation of the lumbar facets reproduce moderately severe discomfort. There was limited range of motion of the lumbar  spine with flexion rotation and extension all reduce significantly. Straight leg raising was tolerates approximately 20 without increased pain with dorsiflexion noted. No definite sensory deficit of dermatomal distribution was detected. Knees were returns to palpation in crepitus of the knees without increased warmth erythema of the knees noted there was no increased joint laxity of the knees noted and there was negative anterior and posterior drawer signs. Manipulation of the knee return reveal significant crepitus of the knees and was associated with moderate pain. No definite sensory deficit of dermatomal distribution the lower extremities noted. There was negative clonus negative Homans. There was moderate tends to palpation of the PSIS and PII S regions as well as the gluteal and piriformis musculature regions with mild tenderness of the greater trochanteric region and iliotibial band region abdomen was nontender and no costovertebral angle tenderness noted.        Assessment & Plan:    Degenerative disc disease of lumbar spine L2-3 L3-4 L4-5 L5-S1 degenerative changes with lumbar spondylosis with multilevel degenerative disc disease lumbar spine, multifactorial spinal and lateral recess stenosis and foraminal stenosis L3-4 diffuse disc bulging, osteophytic multifactorial spinal stenosis and lateral recess stenosis and foraminal stenosis spinal stenosis L4-L5 and similar changes L5-S1  Sacroiliac joint dysfunction  Degenerative joint disease of knee  Degenerative disc disease cervical spine  Cervicogenic headaches  Greater occipital neuralgia  Degenerative joint disease of shoulders    Plan   Continue present medications hydrocodone acetaminophen and morphine sulfate extended-release  F/U PCP Dr. Ginette Pitman b for evaliation of  BP and general medical  condition  F/U surgical evaluation as needed. Patient referred to avoid surgical evaluation of shoulder at this time  F/U  neurological evaluation as needed  May consider radiofrequency rhizolysis  or intraspinal procedures pending response to present treatment and F/U evaluation   Patient to call [facility] should patient have concerns prior to scheduled return appointment.

## 2015-01-26 NOTE — Patient Instructions (Signed)
PLAN   Continue present medication hydrocodone acetaminophen and MS Contin(morphine sulfate extended release)  F/U PCP Dr.Hande  for evaliation of  BP and general medical  condition  F/U surgical evaluation. May consider pending follow-up evaluations  F/U neurological evaluation. May consider pending follow-up evaluations  May consider radiofrequency rhizolysis or intraspinal procedures pending response to present treatment and F/U evaluation   Patient to call [facility] should patient have concerns prior to scheduled return appointment.

## 2015-01-26 NOTE — Progress Notes (Signed)
Safety precautions to be maintained throughout the outpatient stay will include: orient to surroundings, keep bed in low position, maintain call bell within reach at all times, provide assistance with transfer out of bed and ambulation.  

## 2015-02-15 DIAGNOSIS — J3489 Other specified disorders of nose and nasal sinuses: Secondary | ICD-10-CM | POA: Insufficient documentation

## 2015-02-23 ENCOUNTER — Encounter: Payer: Self-pay | Admitting: Pain Medicine

## 2015-02-23 ENCOUNTER — Ambulatory Visit: Payer: Medicare Other | Attending: Pain Medicine | Admitting: Pain Medicine

## 2015-02-23 VITALS — BP 163/72 | HR 82 | Temp 97.9°F | Resp 16 | Ht 68.0 in | Wt 160.0 lb

## 2015-02-23 DIAGNOSIS — M5134 Other intervertebral disc degeneration, thoracic region: Secondary | ICD-10-CM

## 2015-02-23 DIAGNOSIS — M5030 Other cervical disc degeneration, unspecified cervical region: Secondary | ICD-10-CM

## 2015-02-23 DIAGNOSIS — M47817 Spondylosis without myelopathy or radiculopathy, lumbosacral region: Secondary | ICD-10-CM

## 2015-02-23 DIAGNOSIS — M79602 Pain in left arm: Secondary | ICD-10-CM | POA: Diagnosis present

## 2015-02-23 DIAGNOSIS — M533 Sacrococcygeal disorders, not elsewhere classified: Secondary | ICD-10-CM

## 2015-02-23 DIAGNOSIS — M5136 Other intervertebral disc degeneration, lumbar region: Secondary | ICD-10-CM | POA: Diagnosis not present

## 2015-02-23 DIAGNOSIS — M79601 Pain in right arm: Secondary | ICD-10-CM | POA: Diagnosis present

## 2015-02-23 DIAGNOSIS — M179 Osteoarthritis of knee, unspecified: Secondary | ICD-10-CM | POA: Insufficient documentation

## 2015-02-23 DIAGNOSIS — M48062 Spinal stenosis, lumbar region with neurogenic claudication: Secondary | ICD-10-CM

## 2015-02-23 DIAGNOSIS — R51 Headache: Secondary | ICD-10-CM | POA: Diagnosis not present

## 2015-02-23 DIAGNOSIS — M170 Bilateral primary osteoarthritis of knee: Secondary | ICD-10-CM

## 2015-02-23 DIAGNOSIS — M47816 Spondylosis without myelopathy or radiculopathy, lumbar region: Secondary | ICD-10-CM | POA: Insufficient documentation

## 2015-02-23 DIAGNOSIS — M4806 Spinal stenosis, lumbar region: Secondary | ICD-10-CM | POA: Diagnosis not present

## 2015-02-23 DIAGNOSIS — M542 Cervicalgia: Secondary | ICD-10-CM | POA: Diagnosis present

## 2015-02-23 MED ORDER — HYDROCODONE-ACETAMINOPHEN 10-325 MG PO TABS: ORAL_TABLET | ORAL | Status: DC

## 2015-02-23 MED ORDER — MORPHINE SULFATE ER 30 MG PO TBCR: EXTENDED_RELEASE_TABLET | ORAL | Status: DC

## 2015-02-23 NOTE — Progress Notes (Signed)
Safety precautions to be maintained throughout the outpatient stay will include: orient to surroundings, keep bed in low position, maintain call bell within reach at all times, provide assistance with transfer out of bed and ambulation.  

## 2015-02-23 NOTE — Patient Instructions (Signed)
PLAN   Continue present medication hydrocodone acetaminophen and MS Contin(morphine sulfate extended release)  F/U PCP Dr.Hande  for evaliation of  BP and general medical  condition  F/U surgical evaluation. May consider pending follow-up evaluations  F/U neurological evaluation. May consider pending follow-up evaluations  May consider radiofrequency rhizolysis or intraspinal procedures pending response to present treatment and F/U evaluation   Patient to call [facility] should patient have concerns prior to scheduled return appointment. 

## 2015-02-23 NOTE — Progress Notes (Signed)
   Subjective:    Patient ID: Nathan Kramer, male    DOB: 05-01-1935, 79 y.o.   MRN: [id number]  HPI The patient is a 79 year old gentleman who returns to pain management Center for further evaluation and treatment of pain involving the neck upper extremity regions lower back and lower extremity region. Patient is with pain which is involving the lower back and lower extremity region predominantly the patient has difficulty standing erect due to severe degenerative disc disease of the lumbar spine with past surgical intervention lumbar region. Patient states present medications have provided him significant relief of pain including interventional treatment. Patient without significant pain involving the knees at this time. We will continue present medications consisting of morphine sulfate extended release and hydrocodone acetaminophen. The patient is understanding and agreement suggested treatment plan.      Review of Systems     Objective:   Physical Exam  There was minimal tenderness to palpation of the splenius capitis and occipitalis musculature region. There was minimal tenderness to palpation of the cervical facet cervical paraspinal musculature region. Patient appeared to be with bilaterally equal grip strength. Tinel and Phalen's maneuver were without increase of pain of significant degree. There was no crepitus of the thoracic region. There was tends to palpation over the thoracic facet thoracic paraspinal must reason a moderate to moderately severe degree with moderate to moderately severe muscle spasms of the thoracic region noted. No crepitus of the thoracic region was noted. Palpation over the lumbar paraspinal muscle region lumbosacral region was attends to palpation of moderately severe degree. Patient was with severe increase of pain with lateral bending rotation extension and palpation of the lumbar facets. Straight leg raise was tolerates approximately 20 without increased pain  with dorsiflexion noted. Knees were tenderness to palpation with negative anterior and posterior drawer signs without ballottement of the patella. There was negative clonus negative Homans. DTRs were difficult to elicit. EHL strength appeared to be decreased. There was negative clonus negative Homans. Abdomen nontender with no costovertebral tenderness noted.      Assessment & Plan:     Degenerative disc disease of lumbar spine L2-3 L3-4 L4-5 L5-S1 degenerative changes with lumbar spondylosis with multilevel degenerative disc disease lumbar spine, multifactorial spinal and lateral recess stenosis and foraminal stenosis L3-4 diffuse disc bulging, osteophytic multifactorial spinal stenosis and lateral recess stenosis and foraminal stenosis spinal stenosis L4-L5 and similar changes L5-S1  Sacroiliac joint dysfunction  Degenerative joint disease of knee  Degenerative disc disease cervical spine  Cervicogenic headaches     PLAN  Continue present medication hydrocodone acetaminophen and MS Contin(morphine sulfate extended release)  F/U PCP Dr.Hande  for evaliation of  BP and general medical  condition  F/U surgical evaluation. May consider pending follow-up evaluations  F/U neurological evaluation. May consider pending follow-up evaluations  May consider radiofrequency rhizolysis or intraspinal procedures pending response to present treatment and F/U evaluation   Patient to call Pain Management Center should patient have concerns prior to scheduled return appointment.

## 2015-03-22 ENCOUNTER — Encounter: Payer: Self-pay | Admitting: Pain Medicine

## 2015-03-22 ENCOUNTER — Encounter: Payer: Medicare Other | Admitting: Pain Medicine

## 2015-03-22 ENCOUNTER — Ambulatory Visit: Payer: Medicare Other | Attending: Pain Medicine | Admitting: Pain Medicine

## 2015-03-22 VITALS — BP 137/53 | HR 81 | Temp 98.0°F | Resp 16 | Ht 68.0 in | Wt 160.0 lb

## 2015-03-22 DIAGNOSIS — M48062 Spinal stenosis, lumbar region with neurogenic claudication: Secondary | ICD-10-CM

## 2015-03-22 DIAGNOSIS — M5126 Other intervertebral disc displacement, lumbar region: Secondary | ICD-10-CM | POA: Diagnosis not present

## 2015-03-22 DIAGNOSIS — M179 Osteoarthritis of knee, unspecified: Secondary | ICD-10-CM | POA: Diagnosis not present

## 2015-03-22 DIAGNOSIS — M5136 Other intervertebral disc degeneration, lumbar region: Secondary | ICD-10-CM | POA: Insufficient documentation

## 2015-03-22 DIAGNOSIS — M5030 Other cervical disc degeneration, unspecified cervical region: Secondary | ICD-10-CM

## 2015-03-22 DIAGNOSIS — M4806 Spinal stenosis, lumbar region: Secondary | ICD-10-CM | POA: Diagnosis not present

## 2015-03-22 DIAGNOSIS — R51 Headache: Secondary | ICD-10-CM | POA: Diagnosis not present

## 2015-03-22 DIAGNOSIS — M545 Low back pain: Secondary | ICD-10-CM | POA: Diagnosis present

## 2015-03-22 DIAGNOSIS — M170 Bilateral primary osteoarthritis of knee: Secondary | ICD-10-CM

## 2015-03-22 DIAGNOSIS — M47816 Spondylosis without myelopathy or radiculopathy, lumbar region: Secondary | ICD-10-CM | POA: Insufficient documentation

## 2015-03-22 DIAGNOSIS — M47817 Spondylosis without myelopathy or radiculopathy, lumbosacral region: Secondary | ICD-10-CM

## 2015-03-22 DIAGNOSIS — M533 Sacrococcygeal disorders, not elsewhere classified: Secondary | ICD-10-CM | POA: Insufficient documentation

## 2015-03-22 DIAGNOSIS — M5134 Other intervertebral disc degeneration, thoracic region: Secondary | ICD-10-CM

## 2015-03-22 DIAGNOSIS — M542 Cervicalgia: Secondary | ICD-10-CM | POA: Diagnosis present

## 2015-03-22 MED ORDER — MORPHINE SULFATE ER 30 MG PO TBCR: EXTENDED_RELEASE_TABLET | ORAL | Status: DC

## 2015-03-22 MED ORDER — HYDROCODONE-ACETAMINOPHEN 10-325 MG PO TABS: ORAL_TABLET | ORAL | Status: DC

## 2015-03-22 NOTE — Progress Notes (Signed)
Safety precautions to be maintained throughout the outpatient stay will include: orient to surroundings, keep bed in low position, maintain call bell within reach at all times, provide assistance with transfer out of bed and ambulation.  

## 2015-03-22 NOTE — Progress Notes (Signed)
   Subjective:    Patient ID: Nathan Kramer, male    DOB: 04/05/1935, 79 y.o.   MRN: [id number]  HPI  The patient is a 79 year old gentleman who returns to pain management Center for further evaluation and treatment of pain involving the region of the neck upper extremity region as well as the lower back and lower extremity region. The patient states that his pain is aggravated by standing walking twisting turning maneuvers. The patient denies any recent trauma change in events of daily living the call significant change in symptomatology. We discussed patient's condition on today's visit and patient is tolerating medications well without undesirable side effects. We will continue hydrocodone acetaminophen and MS Contin as prescribed. The patient states the pain involving the knees is also fairly well-controlled at this time as well as pain involving the back and neck. We will remain available to consider interventional treatment should patient's pain be without significant relief with noninterventional treatment measures. The patient agreed to suggested treatment plan           Review of Systems     Objective:   Physical Exam There was tenderness to palpation of the paraspinal musculature region cervical region cervical facet region of moderate degree. There was moderate tenderness of the splenius capitis and occipitalis musculature regions. There was tenderness to palpation over the acromioclavicular and glenohumeral joint region a mild degree. Patient appeared to be with bilaterally equal grip strength. There was unremarkable Spurling's maneuver. Tinel and Phalen's maneuver were without increased pain of significant degree. There was tenderness to palpation over the thoracic facet thoracic paraspinal musculature region without crepitus of the thoracic region noted. Palpation over the lumbar paraspinal musculature region lumbar facet region was a tennis to palpation with lateral bending  rotation extension and palpation over the lumbar facets reproducing severe pain. There was tenderness to palpation over the region of the gluteal and piriformis musculature regions. With palpation over the PSIS and PII S region reproduces severe pain. Straight leg raising was tolerates approximately 20 without increased pain with dorsiflexion noted. There was negative clonus negative Homans. Abdomen was without tenderness to palpation and no costovertebral tenderness was noted.       Assessment & Plan:    Degenerative disc disease of lumbar spine L2-3 L3-4 L4-5 L5-S1 degenerative changes with lumbar spondylosis with multilevel degenerative disc disease lumbar spine, multifactorial spinal and lateral recess stenosis and foraminal stenosis L3-4 diffuse disc bulging, osteophytic multifactorial spinal stenosis and lateral recess stenosis and foraminal stenosis spinal stenosis L4-L5 and similar changes L5-S1  Sacroiliac joint dysfunction  Degenerative joint disease of knee  Degenerative disc disease cervical spine  Cervicogenic headaches    PLAN   Continue present medication hydrocodone acetaminophen and MS Contin(morphine sulfate extended release)  F/U PCP Dr.Hande  for evaliation of  BP and general medical  condition  F/U surgical evaluation. May consider pending follow-up evaluations  F/U neurological evaluation. May consider pending follow-up evaluations  May consider radiofrequency rhizolysis or intraspinal procedures pending response to present treatment and F/U evaluation   Patient to call Pain Management Center should patient have concerns prior to scheduled return appointment.

## 2015-03-22 NOTE — Patient Instructions (Signed)
PLAN   Continue present medication hydrocodone acetaminophen and MS Contin(morphine sulfate extended release)  F/U PCP Dr.Hande  for evaliation of  BP and general medical  condition  F/U surgical evaluation. May consider pending follow-up evaluations  F/U neurological evaluation. May consider pending follow-up evaluations  May consider radiofrequency rhizolysis or intraspinal procedures pending response to present treatment and F/U evaluation   Patient to call [facility] should patient have concerns prior to scheduled return appointment. 

## 2015-04-09 ENCOUNTER — Other Ambulatory Visit: Payer: Self-pay | Admitting: Pain Medicine

## 2015-04-29 ENCOUNTER — Ambulatory Visit: Payer: Medicare Other | Attending: Pain Medicine | Admitting: Pain Medicine

## 2015-04-29 ENCOUNTER — Encounter: Payer: Self-pay | Admitting: Pain Medicine

## 2015-04-29 VITALS — BP 111/88 | HR 83 | Temp 98.0°F | Resp 16 | Ht 68.0 in | Wt 160.0 lb

## 2015-04-29 DIAGNOSIS — M47816 Spondylosis without myelopathy or radiculopathy, lumbar region: Secondary | ICD-10-CM | POA: Insufficient documentation

## 2015-04-29 DIAGNOSIS — M5030 Other cervical disc degeneration, unspecified cervical region: Secondary | ICD-10-CM | POA: Insufficient documentation

## 2015-04-29 DIAGNOSIS — M542 Cervicalgia: Secondary | ICD-10-CM | POA: Diagnosis present

## 2015-04-29 DIAGNOSIS — M170 Bilateral primary osteoarthritis of knee: Secondary | ICD-10-CM | POA: Insufficient documentation

## 2015-04-29 DIAGNOSIS — M48062 Spinal stenosis, lumbar region with neurogenic claudication: Secondary | ICD-10-CM

## 2015-04-29 DIAGNOSIS — M533 Sacrococcygeal disorders, not elsewhere classified: Secondary | ICD-10-CM | POA: Diagnosis not present

## 2015-04-29 DIAGNOSIS — M546 Pain in thoracic spine: Secondary | ICD-10-CM | POA: Diagnosis present

## 2015-04-29 DIAGNOSIS — M4806 Spinal stenosis, lumbar region: Secondary | ICD-10-CM | POA: Insufficient documentation

## 2015-04-29 DIAGNOSIS — M5136 Other intervertebral disc degeneration, lumbar region: Secondary | ICD-10-CM | POA: Diagnosis not present

## 2015-04-29 DIAGNOSIS — M5134 Other intervertebral disc degeneration, thoracic region: Secondary | ICD-10-CM

## 2015-04-29 DIAGNOSIS — M47817 Spondylosis without myelopathy or radiculopathy, lumbosacral region: Secondary | ICD-10-CM

## 2015-04-29 MED ORDER — MORPHINE SULFATE ER 30 MG PO TBCR: EXTENDED_RELEASE_TABLET | ORAL | Status: DC

## 2015-04-29 MED ORDER — HYDROCODONE-ACETAMINOPHEN 10-325 MG PO TABS: ORAL_TABLET | ORAL | Status: DC

## 2015-04-29 NOTE — Patient Instructions (Addendum)
PLAN   Continue present medication hydrocodone acetaminophen and MS Contin(morphine sulfate extended release)  F/U PCP Dr.Hande  for evaliation of  BP and general medical  condition  F/U surgical evaluation. May consider pending follow-up evaluations Consider wearing brace on knees  F/U neurological evaluation. May consider pending follow-up evaluations  May consider radiofrequency rhizolysis or intraspinal procedures pending response to present treatment and F/U evaluation   Patient to call Pain Management Center should patient have concerns prior to scheduled return appointment.

## 2015-04-29 NOTE — Progress Notes (Signed)
Subjective:    Patient ID: Nathan Kramer, male    DOB: 03-05-1936, 80 y.o.   MRN: [id number]  HPI  The patient is a 80 year old gentleman who returns to pain management for further evaluation and treatment of pain involving the neck entire back upper and lower extremity regions. On today's visit patient stated that he has had some pain involving the region of the knee as well as the lower back region. The lower back pain radiates across the back and continues to the buttocks and to the lower extremities with prolonged standing and walking. Patient states the pain involving the knees associated with standing walking and that the aching sensation is present throughout the day. We have discussed interventional treatment including geniculate nerve blocks of the knees with consideration to be given to radiofrequency procedures. At the present time we will continue patient's medications consisting of morphine sulfate extended release and hydrocodone acetaminophen. The patient denies any trauma change in events of daily living the cost change in symptomatology. The patient is in agreement with suggested treatment plan and continue medications as prescribed and we'll consider interventional treatment as discussed in attempt to decrease severe pain of the knees and the lower back region. All were understanding and in agreement with suggested treatment plan.  Review of Systems     Objective:   Physical Exam  There was tenderness to palpation of paraspinal muscular region the cervical region cervical facet region palpation which reproduces pain of moderate degree with limited range of motion of the cervical spine and flexion extension rotation and lateral bending. There appeared to be unremarkable Spurling's maneuver and patient was with decreased grip strength. Tinel and Phalen's maneuver were without increase of pain of significant degree. Palpation of the acromioclavicular and glenohumeral joint regions  reproduces moderate discomfort. Palpation over the thoracic facet thoracic paraspinal musculature region was attends to palpation of moderate degree with moderate muscle spasm of the thoracic paraspinal musculatures and severe muscle spasms of the lower thoracic paraspinal musculature region. No crepitus of the thoracic region was noted. Palpation over the lumbar paraspinal musculature region and lumbar facet region was associated with severe discomfort with lateral bending rotation extension and palpation of the lumbar facets reproducing severe discomfort. Straight leg raising was tolerated to approximately 20 with questionably decreased sensation of the L5 dermatomal distribution with negative clonus negative Homans. There was tends to palpation of the knees with crepitus of the knees with negative anterior and posterior drawer signs without ballottement of the patella. There was severe tenderness to palpation of the knees noted palpation of the PSIS and PII S region reproduces moderate to moderately severe discomfort. Palpation of the greater trochanteric region iliotibial band region was with mild to moderate discomfort. There was negative clonus negative Homans. Abdomen was nontender with no costovertebral tenderness noted      Assessment & Plan:    Degenerative disc disease of lumbar spine L2-3 L3-4 L4-5 L5-S1 degenerative changes with lumbar spondylosis with multilevel degenerative disc disease lumbar spine, multifactorial spinal and lateral recess stenosis and foraminal stenosis L3-4 diffuse disc bulging, osteophytic multifactorial spinal stenosis and lateral recess stenosis and foraminal stenosis spinal stenosis L4-L5 and similar changes L5-S1  Sacroiliac joint dysfunction  Degenerative joint disease of knees  Degenerative disc disease cervical spine     PLAN   Continue present medication hydrocodone acetaminophen and MS Contin(morphine sulfate extended release)  F/U PCP Dr.Hande   for evaliation of  BP and general medical  condition  F/U  surgical evaluation. May consider pending follow-up evaluations Consider wearing brace on knees  F/U neurological evaluation. May consider pending follow-up evaluations  May consider radiofrequency rhizolysis or intraspinal procedures pending response to present treatment and F/U evaluation   Patient to call Pain Management Center should patient have concerns prior to scheduled return appointment.

## 2015-04-29 NOTE — Progress Notes (Signed)
Safety precautions to be maintained throughout the outpatient stay will include: orient to surroundings, keep bed in low position, maintain call bell within reach at all times, provide assistance with transfer out of bed and ambulation.  

## 2015-05-07 ENCOUNTER — Telehealth: Payer: Self-pay | Admitting: Pain Medicine

## 2015-05-07 NOTE — Telephone Encounter (Signed)
Patient having knee pain and would like to come for injection in his knee, he is already sched for med refill on 05-27-15 could he come for injection and meds on 05-26-15 ?

## 2015-05-07 NOTE — Telephone Encounter (Signed)
Tried to call to sched knee injection 11:50 05-07-15 no ans and no vmail, will try again Monday 05-10-15

## 2015-05-07 NOTE — Telephone Encounter (Signed)
Spoke with Dr Primus Bravo and he wants the patient put on the schedule for a Genicular nerve block with sedation on 05-26-15.  Please schedule with patient. Spoke with Juliann Pulse and routed message to her.

## 2015-05-09 ENCOUNTER — Other Ambulatory Visit: Payer: Self-pay | Admitting: Pain Medicine

## 2015-05-09 DIAGNOSIS — M170 Bilateral primary osteoarthritis of knee: Secondary | ICD-10-CM

## 2015-05-09 DIAGNOSIS — M51369 Other intervertebral disc degeneration, lumbar region without mention of lumbar back pain or lower extremity pain: Secondary | ICD-10-CM

## 2015-05-09 DIAGNOSIS — M5136 Other intervertebral disc degeneration, lumbar region: Secondary | ICD-10-CM

## 2015-05-26 ENCOUNTER — Encounter: Payer: Self-pay | Admitting: Pain Medicine

## 2015-05-26 ENCOUNTER — Ambulatory Visit: Payer: Medicare Other | Attending: Pain Medicine | Admitting: Pain Medicine

## 2015-05-26 VITALS — BP 141/66 | HR 80 | Temp 97.5°F | Resp 14 | Ht 68.0 in | Wt 160.0 lb

## 2015-05-26 DIAGNOSIS — M48062 Spinal stenosis, lumbar region with neurogenic claudication: Secondary | ICD-10-CM

## 2015-05-26 DIAGNOSIS — M79606 Pain in leg, unspecified: Secondary | ICD-10-CM | POA: Diagnosis present

## 2015-05-26 DIAGNOSIS — M170 Bilateral primary osteoarthritis of knee: Secondary | ICD-10-CM

## 2015-05-26 DIAGNOSIS — M25561 Pain in right knee: Secondary | ICD-10-CM | POA: Diagnosis present

## 2015-05-26 DIAGNOSIS — M533 Sacrococcygeal disorders, not elsewhere classified: Secondary | ICD-10-CM

## 2015-05-26 DIAGNOSIS — M1711 Unilateral primary osteoarthritis, right knee: Secondary | ICD-10-CM | POA: Insufficient documentation

## 2015-05-26 DIAGNOSIS — M5136 Other intervertebral disc degeneration, lumbar region: Secondary | ICD-10-CM

## 2015-05-26 DIAGNOSIS — M47817 Spondylosis without myelopathy or radiculopathy, lumbosacral region: Secondary | ICD-10-CM

## 2015-05-26 DIAGNOSIS — M545 Low back pain: Secondary | ICD-10-CM | POA: Diagnosis present

## 2015-05-26 DIAGNOSIS — M5134 Other intervertebral disc degeneration, thoracic region: Secondary | ICD-10-CM

## 2015-05-26 DIAGNOSIS — M5030 Other cervical disc degeneration, unspecified cervical region: Secondary | ICD-10-CM

## 2015-05-26 MED ORDER — TRIAMCINOLONE ACETONIDE 40 MG/ML IJ SUSP
40.0000 mg | Freq: Once | INTRAMUSCULAR | Status: AC
  Administered 2015-05-26: 14:00:00

## 2015-05-26 MED ORDER — HYDROCODONE-ACETAMINOPHEN 10-325 MG PO TABS: ORAL_TABLET | ORAL | Status: DC

## 2015-05-26 MED ORDER — MIDAZOLAM HCL 5 MG/5ML IJ SOLN
5.0000 mg | Freq: Once | INTRAMUSCULAR | Status: AC
  Administered 2015-05-26: 3 mg via INTRAVENOUS

## 2015-05-26 MED ORDER — LIDOCAINE HCL (PF) 1 % IJ SOLN
INTRAMUSCULAR | Status: AC
Start: 2015-05-26 — End: 2015-05-26
  Filled 2015-05-26: qty 10

## 2015-05-26 MED ORDER — MIDAZOLAM HCL 5 MG/5ML IJ SOLN
INTRAMUSCULAR | Status: AC
Start: 2015-05-26 — End: 2015-05-26
  Administered 2015-05-26: 3 mg via INTRAVENOUS
  Filled 2015-05-26: qty 5

## 2015-05-26 MED ORDER — CIPROFLOXACIN IN D5W 400 MG/200ML IV SOLN
INTRAVENOUS | Status: AC
Start: 2015-05-26 — End: 2015-05-26
  Administered 2015-05-26: 400 mg via INTRAVENOUS
  Filled 2015-05-26: qty 200

## 2015-05-26 MED ORDER — CIPROFLOXACIN HCL 250 MG PO TABS: 250.0000 mg | ORAL_TABLET | Freq: Two times a day (BID) | ORAL | Status: DC

## 2015-05-26 MED ORDER — FENTANYL CITRATE (PF) 100 MCG/2ML IJ SOLN
100.0000 ug | Freq: Once | INTRAMUSCULAR | Status: AC
  Administered 2015-05-26: 50 ug via INTRAVENOUS

## 2015-05-26 MED ORDER — TRIAMCINOLONE ACETONIDE 40 MG/ML IJ SUSP
INTRAMUSCULAR | Status: AC
  Filled 2015-05-26: qty 1

## 2015-05-26 MED ORDER — BUPIVACAINE HCL (PF) 0.25 % IJ SOLN
30.0000 mL | Freq: Once | INTRAMUSCULAR | Status: AC
  Administered 2015-05-26: 14:00:00

## 2015-05-26 MED ORDER — BUPIVACAINE HCL (PF) 0.25 % IJ SOLN
INTRAMUSCULAR | Status: AC
Start: 2015-05-26 — End: 2015-05-26
  Filled 2015-05-26: qty 30

## 2015-05-26 MED ORDER — CIPROFLOXACIN IN D5W 400 MG/200ML IV SOLN
400.0000 mg | Freq: Once | INTRAVENOUS | Status: AC
  Administered 2015-05-26: 400 mg via INTRAVENOUS

## 2015-05-26 MED ORDER — LACTATED RINGERS IV SOLN: 1000.0000 mL | INTRAVENOUS | Status: DC

## 2015-05-26 MED ORDER — LIDOCAINE HCL (PF) 1 % IJ SOLN
10.0000 mL | Freq: Once | INTRAMUSCULAR | Status: AC
  Administered 2015-05-26: 14:00:00 via SUBCUTANEOUS

## 2015-05-26 MED ORDER — MORPHINE SULFATE ER 30 MG PO TBCR: EXTENDED_RELEASE_TABLET | ORAL | Status: DC

## 2015-05-26 MED ORDER — FENTANYL CITRATE (PF) 100 MCG/2ML IJ SOLN
INTRAMUSCULAR | Status: AC
  Administered 2015-05-26: 50 ug via INTRAVENOUS
  Filled 2015-05-26: qty 2

## 2015-05-26 NOTE — Patient Instructions (Addendum)
PLAN   Continue present medication hydrocodone acetaminophen and MS Contin(morphine sulfate extended release) Please get Cipro antibiotic today and begin taking Cipro antibiotic today as prescribed   F/U PCP Dr.Hande  for evaliation of  BP and general medical  condition  F/U surgical evaluation. May consider pending follow-up evaluations Consider wearing brace on knees  F/U neurological evaluation. May consider pending follow-up evaluations  May consider radiofrequency rhizolysis or intraspinal procedures pending response to present treatment and F/U evaluation   Patient to call Pain Management Center should patient have concerns prior to scheduled return appointment.GENERAL RISKS AND COMPLICATIONS  What are the risk, side effects and possible complications? Generally speaking, most procedures are safe.  However, with any procedure there are risks, side effects, and the possibility of complications.  The risks and complications are dependent upon the sites that are lesioned, or the type of nerve block to be performed.  The closer the procedure is to the spine, the more serious the risks are.  Great care is taken when placing the radio frequency needles, block needles or lesioning probes, but sometimes complications can occur. 1. Infection: Any time there is an injection through the skin, there is a risk of infection.  This is why sterile conditions are used for these blocks.  There are four possible types of infection. 1. Localized skin infection. 2. Central Nervous System Infection-This can be in the form of Meningitis, which can be deadly. 3. Epidural Infections-This can be in the form of an epidural abscess, which can cause pressure inside of the spine, causing compression of the spinal cord with subsequent paralysis. This would require an emergency surgery to decompress, and there are no guarantees that the patient would recover from the paralysis. 4. Discitis-This is an infection of the  intervertebral discs.  It occurs in about 1% of discography procedures.  It is difficult to treat and it may lead to surgery.        2. Pain: the needles have to go through skin and soft tissues, will cause soreness.       3. Damage to internal structures:  The nerves to be lesioned may be near blood vessels or    other nerves which can be potentially damaged.       4. Bleeding: Bleeding is more common if the patient is taking blood thinners such as  aspirin, Coumadin, Ticiid, Plavix, etc., or if he/she have some genetic predisposition  such as hemophilia. Bleeding into the spinal canal can cause compression of the spinal  cord with subsequent paralysis.  This would require an emergency surgery to  decompress and there are no guarantees that the patient would recover from the  paralysis.       5. Pneumothorax:  Puncturing of a lung is a possibility, every time a needle is introduced in  the area of the chest or upper back.  Pneumothorax refers to free air around the  collapsed lung(s), inside of the thoracic cavity (chest cavity).  Another two possible  complications related to a similar event would include: Hemothorax and Chylothorax.   These are variations of the Pneumothorax, where instead of air around the collapsed  lung(s), you may have blood or chyle, respectively.       6. Spinal headaches: They may occur with any procedures in the area of the spine.       7. Persistent CSF (Cerebro-Spinal Fluid) leakage: This is a rare problem, but may occur  with prolonged intrathecal or epidural catheters either  due to the formation of a fistulous  track or a dural tear.       8. Nerve damage: By working so close to the spinal cord, there is always a possibility of  nerve damage, which could be as serious as a permanent spinal cord injury with  paralysis.       9. Death:  Although rare, severe deadly allergic reactions known as "Anaphylactic  reaction" can occur to any of the medications used.       10. Worsening of the symptoms:  We can always make thing worse.  What are the chances of something like this happening? Chances of any of this occuring are extremely low.  By statistics, you have more of a chance of getting killed in a motor vehicle accident: while driving to the hospital than any of the above occurring .  Nevertheless, you should be aware that they are possibilities.  In general, it is similar to taking a shower.  Everybody knows that you can slip, hit your head and get killed.  Does that mean that you should not shower again?  Nevertheless always keep in mind that statistics do not mean anything if you happen to be on the wrong side of them.  Even if a procedure has a 1 (one) in a 1,000,000 (million) chance of going wrong, it you happen to be that one..Also, keep in mind that by statistics, you have more of a chance of having something go wrong when taking medications.  Who should not have this procedure? If you are on a blood thinning medication (e.g. Coumadin, Plavix, see list of "Blood Thinners"), or if you have an active infection going on, you should not have the procedure.  If you are taking any blood thinners, please inform your physician.  How should I prepare for this procedure?  Do not eat or drink anything at least six hours prior to the procedure.  Bring a driver with you .  It cannot be a taxi.  Come accompanied by an adult that can drive you back, and that is strong enough to help you if your legs get weak or numb from the local anesthetic.  Take all of your medicines the morning of the procedure with just enough water to swallow them.  If you have diabetes, make sure that you are scheduled to have your procedure done first thing in the morning, whenever possible.  If you have diabetes, take only half of your insulin dose and notify our nurse that you have done so as soon as you arrive at the clinic.  If you are diabetic, but only take blood sugar pills (oral  hypoglycemic), then do not take them on the morning of your procedure.  You may take them after you have had the procedure.  Do not take aspirin or any aspirin-containing medications, at least eleven (11) days prior to the procedure.  They may prolong bleeding.  Wear loose fitting clothing that may be easy to take off and that you would not mind if it got stained with Betadine or blood.  Do not wear any jewelry or perfume  Remove any nail coloring.  It will interfere with some of our monitoring equipment.  NOTE: Remember that this is not meant to be interpreted as a complete list of all possible complications.  Unforeseen problems may occur.  BLOOD THINNERS The following drugs contain aspirin or other products, which can cause increased bleeding during surgery and should not be taken for 2  weeks prior to and 1 week after surgery.  If you should need take something for relief of minor pain, you may take acetaminophen which is found in Tylenol,m Datril, Anacin-3 and Panadol. It is not blood thinner. The products listed below are.  Do not take any of the products listed below in addition to any listed on your instruction sheet.  A.P.C or A.P.C with Codeine Codeine Phosphate Capsules #3 Ibuprofen Ridaura  ABC compound Congesprin Imuran rimadil  Advil Cope Indocin Robaxisal  Alka-Seltzer Effervescent Pain Reliever and Antacid Coricidin or Coricidin-D  Indomethacin Rufen  Alka-Seltzer plus Cold Medicine Cosprin Ketoprofen S-A-C Tablets  Anacin Analgesic Tablets or Capsules Coumadin Korlgesic Salflex  Anacin Extra Strength Analgesic tablets or capsules CP-2 Tablets Lanoril Salicylate  Anaprox Cuprimine Capsules Levenox Salocol  Anexsia-D Dalteparin Magan Salsalate  Anodynos Darvon compound Magnesium Salicylate Sine-off  Ansaid Dasin Capsules Magsal Sodium Salicylate  Anturane Depen Capsules Marnal Soma  APF Arthritis pain formula Dewitt's Pills Measurin Stanback  Argesic Dia-Gesic Meclofenamic  Sulfinpyrazone  Arthritis Bayer Timed Release Aspirin Diclofenac Meclomen Sulindac  Arthritis pain formula Anacin Dicumarol Medipren Supac  Analgesic (Safety coated) Arthralgen Diffunasal Mefanamic Suprofen  Arthritis Strength Bufferin Dihydrocodeine Mepro Compound Suprol  Arthropan liquid Dopirydamole Methcarbomol with Aspirin Synalgos  ASA tablets/Enseals Disalcid Micrainin Tagament  Ascriptin Doan's Midol Talwin  Ascriptin A/D Dolene Mobidin Tanderil  Ascriptin Extra Strength Dolobid Moblgesic Ticlid  Ascriptin with Codeine Doloprin or Doloprin with Codeine Momentum Tolectin  Asperbuf Duoprin Mono-gesic Trendar  Aspergum Duradyne Motrin or Motrin IB Triminicin  Aspirin plain, buffered or enteric coated Durasal Myochrisine Trigesic  Aspirin Suppositories Easprin Nalfon Trillsate  Aspirin with Codeine Ecotrin Regular or Extra Strength Naprosyn Uracel  Atromid-S Efficin Naproxen Ursinus  Auranofin Capsules Elmiron Neocylate Vanquish  Axotal Emagrin Norgesic Verin  Azathioprine Empirin or Empirin with Codeine Normiflo Vitamin E  Azolid Emprazil Nuprin Voltaren  Bayer Aspirin plain, buffered or children's or timed BC Tablets or powders Encaprin Orgaran Warfarin Sodium  Buff-a-Comp Enoxaparin Orudis Zorpin  Buff-a-Comp with Codeine Equegesic Os-Cal-Gesic   Buffaprin Excedrin plain, buffered or Extra Strength Oxalid   Bufferin Arthritis Strength Feldene Oxphenbutazone   Bufferin plain or Extra Strength Feldene Capsules Oxycodone with Aspirin   Bufferin with Codeine Fenoprofen Fenoprofen Pabalate or Pabalate-SF   Buffets II Flogesic Panagesic   Buffinol plain or Extra Strength Florinal or Florinal with Codeine Panwarfarin   Buf-Tabs Flurbiprofen Penicillamine   Butalbital Compound Four-way cold tablets Penicillin   Butazolidin Fragmin Pepto-Bismol   Carbenicillin Geminisyn Percodan   Carna Arthritis Reliever Geopen Persantine   Carprofen Gold's salt Persistin   Chloramphenicol Goody's  Phenylbutazone   Chloromycetin Haltrain Piroxlcam   Clmetidine heparin Plaquenil   Cllnoril Hyco-pap Ponstel   Clofibrate Hydroxy chloroquine Propoxyphen         Before stopping any of these medications, be sure to consult the physician who ordered them.  Some, such as Coumadin (Warfarin) are ordered to prevent or treat serious conditions such as "deep thrombosis", "pumonary embolisms", and other heart problems.  The amount of time that you may need off of the medication may also vary with the medication and the reason for which you were taking it.  If you are taking any of these medications, please make sure you notify your pain physician before you undergo any procedures.

## 2015-05-26 NOTE — Progress Notes (Signed)
Safety precautions to be maintained throughout the outpatient stay will include: orient to surroundings, keep bed in low position, maintain call bell within reach at all times, provide assistance with transfer out of bed and ambulation.  

## 2015-05-26 NOTE — Progress Notes (Signed)
Subjective:    Patient ID: Nathan Kramer, male    DOB: [birth year], 80 y.o.   MRN: [id number]  HPI  Geniculate right blocks of the right knee   The patient is a 80 y.o. male who [facility] for further evaluation and treatment of pain involving the lumbar lower extremity region with severe pain of the right knee. Prior studies reveal patient to be with significant degenerative joint disease of the knee.  We will proceed with geniculate nerve blocks of the right knee in an attempt to decrease severity of symptoms, minimize the risk of medication escalation, hopefully retard progression of symptoms and avoid the need for more involved treatment.  The risks benefits and expectations of the procedure were discussed with the patient. The patient was with understanding and in agreement with suggested treatment plan.  DESCRIPTION OF PROCEDURE: Geniculate nerve blocks of the right knee. The  procedure was performed with IV Versed and IV fentanyl, conscious sedation and under fluoroscopic guidance.  NEEDLE PLACEMENT FOR BLOCK OF THE LATERAL SUPERIOR GENICULATE NERVE: The patient was taking to the fluoroscopy suite. With the patient supine, with knee in flexed position, Betadine prep of proposed entry site accomplished.  IV Versed, IV fentanyl conscious sedation, EKG, blood pressure, pulse and pulse oximetry monitoring were all in place. Under fluoroscopic guidance, a 22-gauge needle was inserted in the region of the right knee with needle placed at the lateral border of the femur at the junction of the shaft of the femur and the condyle of the femur.  Following needle placement at the lateral aspect of the knee, needle placement was then accomplished in the region of the medial aspect of the knee.  NEEDLE PLACEMENT FOR BLOCK OF THE MEDIAL SUPERIOR GENICULATE NERVE:  Under fluoroscopic guidance, a 22 - gauge needle was inserted in the region of the right knee with needle placed at  the medial border of the femur at the junction of the shaft of the femur and the condyle of the femur.   NEEDLE PLACEMENT FOR BLOCK OF THE MEDIAL INFERIOR GENICULATE NERVE:  Under fluoroscopic guidance, a 22 - gauge needle was inserted in the region of the right knee with needle placed at the junction of the shaft and plateau of the tibia.   Following needle placement on AP view of needles placed in all three locations, placement was then verified on lateral view with the tips of the superior lateral and superior medial needles documented to be one half the distance of the shaft of the femur and the tip of the inferior medial geniculate needle documented to be one half the distance of the shaft of the tibia.  Following documentation of needle placements on lateral view, each needle was injected with one mL of 0.25% bupivacaine with Kenalog.     A total of 10 mg of Kenalog was utilized for the entire procedure. The patient tolerated the procedure well.    PLAN 1. Medications: Continue present medications morphine sulfate extended release and hydrocodone acetaminophen  2. Follow-up appointment with PCP  Dr. Ginette Pitman for evaluation of blood pressure and general medical condition. 3. Follow-up surgical evaluation has been addressed 4. Follow-up neurological evaluation. We will avoid at this time 5. He patient may be a candidate for radiofrequency rhizolysis and other treatment pending response to treatment on today's visit and follow-up evaluation. 6. The patient is advised to adhere to proper body mechanics and avoid activities which appear to aggravate condition  Review of Systems     Objective:   Physical Exam        Assessment & Plan:

## 2015-05-27 ENCOUNTER — Ambulatory Visit: Payer: Medicare Other | Admitting: Pain Medicine

## 2015-05-27 ENCOUNTER — Encounter: Payer: Medicare Other | Admitting: Pain Medicine

## 2015-05-27 ENCOUNTER — Telehealth: Payer: Self-pay | Admitting: *Deleted

## 2015-05-27 NOTE — Telephone Encounter (Signed)
Left voicemail to call our office if he has any questions or concerns re; procedure on yesterday.

## 2015-06-24 ENCOUNTER — Encounter: Payer: Self-pay | Admitting: Pain Medicine

## 2015-06-24 ENCOUNTER — Ambulatory Visit: Payer: Medicare Other | Attending: Pain Medicine | Admitting: Pain Medicine

## 2015-06-24 VITALS — BP 150/58 | HR 77 | Temp 97.6°F | Resp 18 | Ht 68.0 in | Wt 155.0 lb

## 2015-06-24 DIAGNOSIS — M5134 Other intervertebral disc degeneration, thoracic region: Secondary | ICD-10-CM

## 2015-06-24 DIAGNOSIS — M533 Sacrococcygeal disorders, not elsewhere classified: Secondary | ICD-10-CM

## 2015-06-24 DIAGNOSIS — M2578 Osteophyte, vertebrae: Secondary | ICD-10-CM | POA: Diagnosis not present

## 2015-06-24 DIAGNOSIS — M5030 Other cervical disc degeneration, unspecified cervical region: Secondary | ICD-10-CM | POA: Diagnosis not present

## 2015-06-24 DIAGNOSIS — M25561 Pain in right knee: Secondary | ICD-10-CM | POA: Insufficient documentation

## 2015-06-24 DIAGNOSIS — M47816 Spondylosis without myelopathy or radiculopathy, lumbar region: Secondary | ICD-10-CM | POA: Diagnosis not present

## 2015-06-24 DIAGNOSIS — M170 Bilateral primary osteoarthritis of knee: Secondary | ICD-10-CM | POA: Diagnosis not present

## 2015-06-24 DIAGNOSIS — M5136 Other intervertebral disc degeneration, lumbar region: Secondary | ICD-10-CM | POA: Diagnosis not present

## 2015-06-24 DIAGNOSIS — M546 Pain in thoracic spine: Secondary | ICD-10-CM | POA: Diagnosis present

## 2015-06-24 DIAGNOSIS — M62830 Muscle spasm of back: Secondary | ICD-10-CM | POA: Insufficient documentation

## 2015-06-24 DIAGNOSIS — M4806 Spinal stenosis, lumbar region: Secondary | ICD-10-CM | POA: Insufficient documentation

## 2015-06-24 DIAGNOSIS — M47817 Spondylosis without myelopathy or radiculopathy, lumbosacral region: Secondary | ICD-10-CM

## 2015-06-24 DIAGNOSIS — M48062 Spinal stenosis, lumbar region with neurogenic claudication: Secondary | ICD-10-CM

## 2015-06-24 DIAGNOSIS — M51369 Other intervertebral disc degeneration, lumbar region without mention of lumbar back pain or lower extremity pain: Secondary | ICD-10-CM

## 2015-06-24 DIAGNOSIS — M542 Cervicalgia: Secondary | ICD-10-CM | POA: Diagnosis present

## 2015-06-24 MED ORDER — HYDROCODONE-ACETAMINOPHEN 10-325 MG PO TABS: ORAL_TABLET | ORAL | Status: DC

## 2015-06-24 MED ORDER — MORPHINE SULFATE ER 30 MG PO TBCR: EXTENDED_RELEASE_TABLET | ORAL | Status: DC

## 2015-06-24 MED ORDER — MORPHINE SULFATE ER 30 MG PO TBCR
EXTENDED_RELEASE_TABLET | ORAL | Status: DC
Start: 2015-06-24 — End: 2015-06-24

## 2015-06-24 NOTE — Progress Notes (Signed)
Safety precautions to be maintained throughout the outpatient stay will include: orient to surroundings, keep bed in low position, maintain call bell within reach at all times, provide assistance with transfer out of bed and ambulation.  

## 2015-06-24 NOTE — Patient Instructions (Signed)
PLAN   Continue present medication hydrocodone acetaminophen and MS Contin(morphine sulfate extended release)  F/U PCP Dr.Hande  for evaliation of  BP and general medical  condition  F/U surgical evaluation. May consider pending follow-up evaluations Consider wearing brace on knees  F/U neurological evaluation. May consider pending follow-up evaluations  May consider radiofrequency rhizolysis or intraspinal procedures pending response to present treatment and F/U evaluation We will avoid such treatment at this time  Patient to call [facility] should patient have concerns prior to scheduled return appointment.

## 2015-06-24 NOTE — Progress Notes (Signed)
Subjective:    Patient ID: Nathan Kramer, male    DOB: [birth year], 80 y.o.   MRN: [id number]  HPI  The patient is a 80 year old gentleman who returns to pain management for further evaluation and treatment of pain involving the neck entire back upper and lower extremity regions. The patient has significant improvement of pain involving the right knee status post geniculate nerve blocks of the knee. The patient states that his pain involves the lower back region and mid back region and pain is aggravated by standing walking twisting turning maneuvers. The patient admits to weakness of the lower extremities with prolonged standing and walking as well. We discussed patient's condition and offers for patient to consider lumbar epidural steroid injection for treatment of what appeared to be symptoms of spinal canal stenosis. We also discussed patient's medications at the present time decision was made to increase MS Contin from 1 pill every 12 hours to 1 pill every 8 hours. The patient will continue hydrocodone acetaminophen for breakthrough pain. The patient was cautioned to avoid hydrocodone acetaminophen for the first few days without increase in the MS Contin and to be very aware of development of drowsiness confusion excessive sedation and other side effects. Both the patient and his wife stated that the patient has never been drowsy on the present dose of MS Contin. The patient was advised to emergency room if immediately should he develop any such symptoms and to call pain management as well. All agreed to suggested treatment plan.     Review of Systems     Objective:   Physical Exam  There was tenderness of the splenius capitis and occipitalis musculature region with limited range of motion of the cervical spine. Palpation over the cervical facet cervical paraspinal must reason was with moderate tends to palpation. There was tenderness over the thoracic facet thoracic paraspinal must reason  as well. Palpation over the thoracic region was a tennis of moderate degree with moderate muscle spasm of the mid and lower thoracic region. Palpation of the acromioclavicular and glenohumeral joint regions were attends to palpation and patient was with minimal difficulty performing drop test. Tinel and Phalen's maneuver were without increase of pain of significant degree. Patient appeared to be with slightly decreased grip strength. Palpation over the lumbar paraspinal must reason lumbar facet region was with moderate to moderately severe discomfort with lateral bending rotation extension and palpation over the lumbar facets reproducing moderately severe discomfort. The patient was with tenderness of the PSIS and PII S region a moderate to moderately severe degree as well as with minimal tenderness of the greater trochanteric region and iliotibial band region. The knees were attends to palpation with crepitus of the knees with negative anterior and posterior drawer signs. There was mild discomfort with range of motion maneuvers of the knee EHL strength appeared to be slightly decreased. There was negative clonus negative Homans. Abdomen nontender with no excessive tends to palpation.      Assessment & Plan:     Degenerative disc disease of lumbar spine L2-3 L3-4 L4-5 L5-S1 degenerative changes with lumbar spondylosis with multilevel degenerative disc disease lumbar spine, multifactorial spinal and lateral recess stenosis and foraminal stenosis L3-4 diffuse disc bulging, osteophytic multifactorial spinal stenosis and lateral recess stenosis and foraminal stenosis spinal stenosis L4-L5 and similar changes L5-S1  Sacroiliac joint dysfunction  Degenerative joint disease of knees  Degenerative disc disease cervical spine    PLAN   Continue present medication hydrocodone acetaminophen and MS  Contin(morphine sulfate extended release)  F/U PCP Dr.Hande  for evaliation of  BP and general medical   condition  F/U surgical evaluation. May consider pending follow-up evaluations Consider wearing brace on knees  F/U neurological evaluation. May consider pending follow-up evaluations  May consider radiofrequency rhizolysis or intraspinal procedures pending response to present treatment and F/U evaluation We will avoid such treatment at this time  Patient to call [facility] should patient have concerns prior to scheduled return appointment.

## 2015-07-01 ENCOUNTER — Other Ambulatory Visit: Payer: Self-pay | Admitting: Pain Medicine

## 2015-07-05 DIAGNOSIS — M48061 Spinal stenosis, lumbar region without neurogenic claudication: Secondary | ICD-10-CM | POA: Insufficient documentation

## 2015-07-05 DIAGNOSIS — T7840XA Allergy, unspecified, initial encounter: Secondary | ICD-10-CM | POA: Insufficient documentation

## 2015-07-05 DIAGNOSIS — Z720 Tobacco use: Secondary | ICD-10-CM | POA: Insufficient documentation

## 2015-07-22 ENCOUNTER — Ambulatory Visit: Payer: Medicare Other | Attending: Pain Medicine | Admitting: Pain Medicine

## 2015-07-22 ENCOUNTER — Encounter: Payer: Self-pay | Admitting: Pain Medicine

## 2015-07-22 VITALS — BP 159/66 | HR 86 | Temp 98.4°F | Resp 18 | Ht 68.0 in | Wt 157.0 lb

## 2015-07-22 DIAGNOSIS — M545 Low back pain: Secondary | ICD-10-CM | POA: Diagnosis present

## 2015-07-22 DIAGNOSIS — M47816 Spondylosis without myelopathy or radiculopathy, lumbar region: Secondary | ICD-10-CM | POA: Insufficient documentation

## 2015-07-22 DIAGNOSIS — M5030 Other cervical disc degeneration, unspecified cervical region: Secondary | ICD-10-CM | POA: Diagnosis not present

## 2015-07-22 DIAGNOSIS — M4806 Spinal stenosis, lumbar region: Secondary | ICD-10-CM | POA: Insufficient documentation

## 2015-07-22 DIAGNOSIS — M170 Bilateral primary osteoarthritis of knee: Secondary | ICD-10-CM | POA: Diagnosis not present

## 2015-07-22 DIAGNOSIS — M19019 Primary osteoarthritis, unspecified shoulder: Secondary | ICD-10-CM | POA: Insufficient documentation

## 2015-07-22 DIAGNOSIS — M2578 Osteophyte, vertebrae: Secondary | ICD-10-CM | POA: Diagnosis not present

## 2015-07-22 DIAGNOSIS — M5134 Other intervertebral disc degeneration, thoracic region: Secondary | ICD-10-CM

## 2015-07-22 DIAGNOSIS — M533 Sacrococcygeal disorders, not elsewhere classified: Secondary | ICD-10-CM

## 2015-07-22 DIAGNOSIS — M5136 Other intervertebral disc degeneration, lumbar region: Secondary | ICD-10-CM | POA: Diagnosis not present

## 2015-07-22 DIAGNOSIS — X58XXXA Exposure to other specified factors, initial encounter: Secondary | ICD-10-CM | POA: Diagnosis not present

## 2015-07-22 DIAGNOSIS — M546 Pain in thoracic spine: Secondary | ICD-10-CM | POA: Diagnosis present

## 2015-07-22 DIAGNOSIS — S82899A Other fracture of unspecified lower leg, initial encounter for closed fracture: Secondary | ICD-10-CM | POA: Diagnosis not present

## 2015-07-22 DIAGNOSIS — M47817 Spondylosis without myelopathy or radiculopathy, lumbosacral region: Secondary | ICD-10-CM

## 2015-07-22 DIAGNOSIS — M48062 Spinal stenosis, lumbar region with neurogenic claudication: Secondary | ICD-10-CM

## 2015-07-22 MED ORDER — HYDROCODONE-ACETAMINOPHEN 10-325 MG PO TABS
ORAL_TABLET | ORAL | Status: DC
Start: 2015-07-22 — End: 2015-07-22

## 2015-07-22 MED ORDER — MORPHINE SULFATE ER 30 MG PO TBCR: EXTENDED_RELEASE_TABLET | ORAL | Status: DC

## 2015-07-22 NOTE — Progress Notes (Signed)
Subjective:    Patient ID: Nathan Kramer, male    DOB: [birth year], 80 y.o.   MRN: [id number]  HPI  The patient is a 80 year old gentleman who returns to pain management for further evaluation and treatment of pain involving upper mid lower back and lower extremity region with return of significant pain involving the right knee and with significant pain involving the left shoulder. The patient states that he is in need of a procedure to decrease severity disabling pain of the shoulder as well as the knee. We discussed patient's condition and patient will continue morphine sulfate extended release and hydrocodone acetaminophen for breakthrough pain and we will consider additional modifications of treatment pending response to treatment and follow-up evaluation we will request medical Dr. Luan Pulling proceed with bilateral occipital nerve blocks at time return appointment for reatment of severely disabling headache. All agreed to suggested treatment plan       Review of Systems     Objective:   Physical Exam  There was tenderness of the splenius capitis and occipitalis musculature region of moderate degree there was moderate to moderately severe tenderness over the cervical facet cervical paraspinal musculature region. Palpation of the acromioclavicular and glenohumeral joint regions reproduce moderate discomfort to moderately severe discomfort with limited range of motion of the shoulder noted. There were no bounding pulsations of the temporal region noted. Palpation of the temporomandibular joint region was with mild discomfort. Palpation of the trapezius, levator scapula, rhomboid musculature regions reproduce moderate discomfort. The patient appeared to be with bilaterally equal grip strength and Tinel and Phalen's maneuver were without increase of pain of significant degree Straight leg raise was tolerates approximately 20 without increased pain with dorsiflexion noted there was questionably  decreased sensation of the L5 dermatomal distribution with negative clonus negative Homans. DTRs were difficult to elicit patient had difficulty relaxing The knees were with tenderness to palpation of moderately severe degree with negative anterior and posterior drawer signs with no ballottement of the patella there was significant crepitus of the knee noted. No increased laxity of the joint was noted. No increased warmth erythema of the knee was noted. palpation of the PSIS and PII S region reproduced moderate discomfort. There was negative Homans. EHL strength was decreased. Patient is status post fracture of ankle. Abdomen nontender with no costovertebral tenderness noted      Assessment & Plan:   Degenerative joint disease of knees  Degenerative joint disease of shoulder  Degenerative disc disease of lumbar spine L2-3 L3-4 L4-5 L5-S1 degenerative changes with lumbar spondylosis with multilevel degenerative disc disease lumbar spine, multifactorial spinal and lateral recess stenosis and foraminal stenosis L3-4 diffuse disc bulging, osteophytic multifactorial spinal stenosis and lateral recess stenosis and foraminal stenosis spinal stenosis L4-L5 and similar changes L5-S1  Sacroiliac joint dysfunction  Degenerative disc disease cervical spine     PLAN   Continue present medication hydrocodone acetaminophen and MS Contin(morphine sulfate extended release)  Geniculate nerve blocks of need to be performed at time of return appointment  F/U PCP Dr.Hande  for evaliation of  BP and general medical  condition  F/U surgical evaluation. May consider pending follow-up evaluations Consider wearing brace on knees as previously discussed  F/U neurological evaluation. May consider PNCV/EMG studies and other studies pending follow-up evaluations  May consider radiofrequency rhizolysis or intraspinal procedures pending response to present treatment and F/U evaluation We will avoid such treatment  at this time  Patient to call [facility] should patient have concerns  prior to scheduled return appointment

## 2015-07-22 NOTE — Patient Instructions (Addendum)
PLAN   Continue present medication hydrocodone acetaminophen and MS Contin(morphine sulfate extended release)  Geniculate nerve blocks of need to be performed at time of return appointment  F/U PCP Dr.Hande  for evaliation of  BP and general medical  condition  F/U surgical evaluation. May consider pending follow-up evaluations Consider wearing brace on knees as previously discussed  F/U neurological evaluation. May consider PNCV/EMG studies and other studies pending follow-up evaluations  May consider radiofrequency rhizolysis or intraspinal procedures pending response to present treatment and F/U evaluation We will avoid such treatment at this time  Patient to call [facility] should patient have concerns prior to scheduled return appointment Do not eat 6 hours before your procedure. You may have clear liquids and your a.m. meds until 2 hours before your procedure.

## 2015-07-26 ENCOUNTER — Ambulatory Visit: Payer: Medicare Other | Attending: Pain Medicine | Admitting: Pain Medicine

## 2015-07-26 ENCOUNTER — Encounter: Payer: Self-pay | Admitting: Pain Medicine

## 2015-07-26 VITALS — BP 163/70 | HR 71 | Temp 97.2°F | Resp 16 | Ht 68.0 in | Wt 158.0 lb

## 2015-07-26 DIAGNOSIS — M25561 Pain in right knee: Secondary | ICD-10-CM | POA: Diagnosis present

## 2015-07-26 DIAGNOSIS — M48062 Spinal stenosis, lumbar region with neurogenic claudication: Secondary | ICD-10-CM

## 2015-07-26 DIAGNOSIS — M5136 Other intervertebral disc degeneration, lumbar region: Secondary | ICD-10-CM

## 2015-07-26 DIAGNOSIS — M47817 Spondylosis without myelopathy or radiculopathy, lumbosacral region: Secondary | ICD-10-CM

## 2015-07-26 DIAGNOSIS — M5030 Other cervical disc degeneration, unspecified cervical region: Secondary | ICD-10-CM

## 2015-07-26 DIAGNOSIS — M170 Bilateral primary osteoarthritis of knee: Secondary | ICD-10-CM

## 2015-07-26 DIAGNOSIS — M179 Osteoarthritis of knee, unspecified: Secondary | ICD-10-CM | POA: Diagnosis not present

## 2015-07-26 DIAGNOSIS — M545 Low back pain: Secondary | ICD-10-CM | POA: Diagnosis present

## 2015-07-26 DIAGNOSIS — M5134 Other intervertebral disc degeneration, thoracic region: Secondary | ICD-10-CM

## 2015-07-26 DIAGNOSIS — M533 Sacrococcygeal disorders, not elsewhere classified: Secondary | ICD-10-CM

## 2015-07-26 DIAGNOSIS — M51369 Other intervertebral disc degeneration, lumbar region without mention of lumbar back pain or lower extremity pain: Secondary | ICD-10-CM

## 2015-07-26 MED ORDER — BUPIVACAINE HCL (PF) 0.25 % IJ SOLN
INTRAMUSCULAR | Status: AC
  Administered 2015-07-26: 14:00:00
  Filled 2015-07-26: qty 30

## 2015-07-26 MED ORDER — MIDAZOLAM HCL 5 MG/5ML IJ SOLN: 5.0000 mg | Freq: Once | INTRAMUSCULAR | Status: DC

## 2015-07-26 MED ORDER — ORPHENADRINE CITRATE 30 MG/ML IJ SOLN
INTRAMUSCULAR | Status: AC
  Administered 2015-07-26: 14:00:00
  Filled 2015-07-26: qty 2

## 2015-07-26 MED ORDER — TRIAMCINOLONE ACETONIDE 40 MG/ML IJ SUSP: 40.0000 mg | Freq: Once | INTRAMUSCULAR | Status: DC

## 2015-07-26 MED ORDER — FENTANYL CITRATE (PF) 100 MCG/2ML IJ SOLN: 100.0000 ug | Freq: Once | INTRAMUSCULAR | Status: DC

## 2015-07-26 MED ORDER — LACTATED RINGERS IV SOLN: 1000.0000 mL | INTRAVENOUS | Status: DC

## 2015-07-26 MED ORDER — CIPROFLOXACIN IN D5W 400 MG/200ML IV SOLN
INTRAVENOUS | Status: AC
  Administered 2015-07-26: 400 mg via INTRAVENOUS
  Filled 2015-07-26: qty 200

## 2015-07-26 MED ORDER — MIDAZOLAM HCL 5 MG/5ML IJ SOLN
INTRAMUSCULAR | Status: AC
  Administered 2015-07-26: 2 mg via INTRAVENOUS
  Filled 2015-07-26: qty 5

## 2015-07-26 MED ORDER — FENTANYL CITRATE (PF) 100 MCG/2ML IJ SOLN
INTRAMUSCULAR | Status: AC
  Administered 2015-07-26: 50 ug via INTRAVENOUS
  Filled 2015-07-26: qty 2

## 2015-07-26 MED ORDER — ORPHENADRINE CITRATE 30 MG/ML IJ SOLN: 60.0000 mg | Freq: Once | INTRAMUSCULAR | Status: DC

## 2015-07-26 MED ORDER — CIPROFLOXACIN IN D5W 400 MG/200ML IV SOLN: 400.0000 mg | Freq: Once | INTRAVENOUS | Status: DC

## 2015-07-26 MED ORDER — CIPROFLOXACIN HCL 250 MG PO TABS: 250.0000 mg | ORAL_TABLET | Freq: Two times a day (BID) | ORAL | Status: DC

## 2015-07-26 MED ORDER — KETOROLAC TROMETHAMINE 60 MG/2ML IM SOLN
INTRAMUSCULAR | Status: AC
  Administered 2015-07-26: 14:00:00
  Filled 2015-07-26: qty 2

## 2015-07-26 MED ORDER — LIDOCAINE HCL (PF) 1 % IJ SOLN: 10.0000 mL | Freq: Once | INTRAMUSCULAR | Status: DC

## 2015-07-26 MED ORDER — TRIAMCINOLONE ACETONIDE 40 MG/ML IJ SUSP
INTRAMUSCULAR | Status: AC
  Administered 2015-07-26: 14:00:00
  Filled 2015-07-26: qty 1

## 2015-07-26 MED ORDER — BUPIVACAINE HCL (PF) 0.25 % IJ SOLN: 30.0000 mL | Freq: Once | INTRAMUSCULAR | Status: DC

## 2015-07-26 NOTE — Progress Notes (Signed)
Patient here for procedure d/t Left knee pain and Left shoulder pain. Safety precautions to be maintained throughout the outpatient stay will include: orient to surroundings, keep bed in low position, maintain call bell within reach at all times, provide assistance with transfer out of bed and ambulation.

## 2015-07-26 NOTE — Progress Notes (Signed)
Subjective:    Patient ID: Nathan Kramer, male    DOB: 1935-09-25, 80 y.o.   MRN: [id number]  HPI  Geniculate nerve blocks of the Right knee   The patient is a 80 y.o. male who returns to the [facility] for further evaluation and treatment of pain involving the lumbar lower extremity region with severe pain of the right knee. Prior studies reveal patient to be with significant degenerative joint disease of the knee.  We will proceed with geniculate nerve blocks of the right knee in an attempt to decrease severity of symptoms, minimize the risk of medication escalation, hopefully retard progression of symptoms and avoid the need for more involved treatment.  The risks benefits and expectations of the procedure were discussed with the patient. The patient was with understanding and in agreement with suggested treatment plan.  DESCRIPTION OF PROCEDURE: Geniculate nerve blocks of the right knee. The  procedure was performed with IV Versed and IV fentanyl, conscious sedation and under fluoroscopic guidance.  NEEDLE PLACEMENT FOR BLOCK OF THE LATERAL SUPERIOR GENICULATE NERVE: The patient was taking to the fluoroscopy suite. With the patient supine, with knee in flexed position, Betadine prep of proposed entry site accomplished.  IV Versed, IV fentanyl conscious sedation, EKG, blood pressure, pulse, capnography, and pulse oximetry monitoring were all in place. Under fluoroscopic guidance, a 22-gauge needle was inserted in the region of the right knee with needle placed at the lateral border of the femur at the junction of the shaft of the femur and the condyle of the femur.  Following needle placement at the lateral aspect of the knee, needle placement was then accomplished in the region of the medial aspect of the knee.  NEEDLE PLACEMENT FOR BLOCK OF THE MEDIAL SUPERIOR GENICULATE NERVE:  Under fluoroscopic guidance, a 22 - gauge needle was inserted in the region of the right knee with  needle placed at the medial border of the femur at the junction of the shaft of the femur and the condyle of the femur.   NEEDLE PLACEMENT FOR BLOCK OF THE MEDIAL INFERIOR GENICULATE NERVE:  Under fluoroscopic guidance, a 22 - gauge needle was inserted in the region of the right knee with needle placed at the junction of the shaft and plateau of the tibia.   Following needle placement on AP view of needles placed in all three locations, placement was then verified on lateral view with the tips of the superior lateral and superior medial needles documented to be one half the distance of the shaft of the femur and the tip of the inferior medial geniculate needle documented to be one half the distance of the shaft of the tibia.  Following documentation of needle placements on lateral view, each needle was injected with one mL of 0.25% bupivacaine with Kenalog. A total of 10 mg of Kenalog was utilized for the entire procedure. The patient tolerated the procedure well.    PLAN 1. Medications: Continue present medications MS Contin and hydrocodone acetaminophen  2. Follow-up appointment with PCP Dr. Ginette Pitman  for evaluation of blood pressure and general medical condition. 3. Follow-up surgical evaluation. Has been addressed  4. Follow-up neurological evaluation Has been addressed  5. He patient may be a candidate for radiofrequency rhizolysis and other treatment pending response to treatment on today's visit and follow-up evaluation. 6. The patient is advised to adhere to proper body mechanics and avoid activities which appear to aggravate condition   Review of Systems  Objective:   Physical Exam        Assessment & Plan:

## 2015-07-26 NOTE — Patient Instructions (Addendum)
PLAN   Continue present medication hydrocodone acetaminophen and MS Contin(morphine sulfate extended release) Please get Cipro antibiotic today and begin taking Cipro antibiotic today as prescribed   F/U PCP Dr.Hande  for evaliation of  BP and general medical  condition  F/U surgical evaluation. May consider pending follow-up evaluations Consider wearing brace on knees  F/U neurological evaluation. May consider pending follow-up evaluations  May consider radiofrequency rhizolysis or intraspinal procedures pending response to present treatment and F/U evaluation   Patient to call Pain Management Center should patient have concerns prior to scheduled return appointment.GENERAL RISKS AND COMPLICATIONS  What are the risk, side effects and possible complications? Generally speaking, most procedures are safe.  However, with any procedure there are risks, side effects, and the possibility of complications.  The risks and complications are dependent upon the sites that are lesioned, or the type of nerve block to be performed.  The closer the procedure is to the spine, the more serious the risks are.  Great care is taken when placing the radio frequency needles, block needles or lesioning probes, but sometimes complications can occur. 1. Infection: Any time there is an injection through the skin, there is a risk of infection.  This is why sterile conditions are used for these blocks.  There are four possible types of infection. 1. Localized skin infection. 2. Central Nervous System Infection-This can be in the form of Meningitis, which can be deadly. 3. Epidural Infections-This can be in the form of an epidural abscess, which can cause pressure inside of the spine, causing compression of the spinal cord with subsequent paralysis. This would require an emergency surgery to decompress, and there are no guarantees that the patient would recover from the paralysis. 4. Discitis-This is an infection of the  intervertebral discs.  It occurs in about 1% of discography procedures.  It is difficult to treat and it may lead to surgery.        2. Pain: the needles have to go through skin and soft tissues, will cause soreness.       3. Damage to internal structures:  The nerves to be lesioned may be near blood vessels or    other nerves which can be potentially damaged.       4. Bleeding: Bleeding is more common if the patient is taking blood thinners such as  aspirin, Coumadin, Ticiid, Plavix, etc., or if he/she have some genetic predisposition  such as hemophilia. Bleeding into the spinal canal can cause compression of the spinal  cord with subsequent paralysis.  This would require an emergency surgery to  decompress and there are no guarantees that the patient would recover from the  paralysis.       5. Pneumothorax:  Puncturing of a lung is a possibility, every time a needle is introduced in  the area of the chest or upper back.  Pneumothorax refers to free air around the  collapsed lung(s), inside of the thoracic cavity (chest cavity).  Another two possible  complications related to a similar event would include: Hemothorax and Chylothorax.   These are variations of the Pneumothorax, where instead of air around the collapsed  lung(s), you may have blood or chyle, respectively.       6. Spinal headaches: They may occur with any procedures in the area of the spine.       7. Persistent CSF (Cerebro-Spinal Fluid) leakage: This is a rare problem, but may occur  with prolonged intrathecal or epidural catheters either  due to the formation of a fistulous  track or a dural tear.       8. Nerve damage: By working so close to the spinal cord, there is always a possibility of  nerve damage, which could be as serious as a permanent spinal cord injury with  paralysis.       9. Death:  Although rare, severe deadly allergic reactions known as "Anaphylactic  reaction" can occur to any of the medications used.       10. Worsening of the symptoms:  We can always make thing worse.  What are the chances of something like this happening? Chances of any of this occuring are extremely low.  By statistics, you have more of a chance of getting killed in a motor vehicle accident: while driving to the hospital than any of the above occurring .  Nevertheless, you should be aware that they are possibilities.  In general, it is similar to taking a shower.  Everybody knows that you can slip, hit your head and get killed.  Does that mean that you should not shower again?  Nevertheless always keep in mind that statistics do not mean anything if you happen to be on the wrong side of them.  Even if a procedure has a 1 (one) in a 1,000,000 (million) chance of going wrong, it you happen to be that one..Also, keep in mind that by statistics, you have more of a chance of having something go wrong when taking medications.  Who should not have this procedure? If you are on a blood thinning medication (e.g. Coumadin, Plavix, see list of "Blood Thinners"), or if you have an active infection going on, you should not have the procedure.  If you are taking any blood thinners, please inform your physician.  How should I prepare for this procedure?  Do not eat or drink anything at least six hours prior to the procedure.  Bring a driver with you .  It cannot be a taxi.  Come accompanied by an adult that can drive you back, and that is strong enough to help you if your legs get weak or numb from the local anesthetic.  Take all of your medicines the morning of the procedure with just enough water to swallow them.  If you have diabetes, make sure that you are scheduled to have your procedure done first thing in the morning, whenever possible.  If you have diabetes, take only half of your insulin dose and notify our nurse that you have done so as soon as you arrive at the clinic.  If you are diabetic, but only take blood sugar pills (oral  hypoglycemic), then do not take them on the morning of your procedure.  You may take them after you have had the procedure.  Do not take aspirin or any aspirin-containing medications, at least eleven (11) days prior to the procedure.  They may prolong bleeding.  Wear loose fitting clothing that may be easy to take off and that you would not mind if it got stained with Betadine or blood.  Do not wear any jewelry or perfume  Remove any nail coloring.  It will interfere with some of our monitoring equipment.  NOTE: Remember that this is not meant to be interpreted as a complete list of all possible complications.  Unforeseen problems may occur.  BLOOD THINNERS The following drugs contain aspirin or other products, which can cause increased bleeding during surgery and should not be taken for 2  weeks prior to and 1 week after surgery.  If you should need take something for relief of minor pain, you may take acetaminophen which is found in Tylenol,m Datril, Anacin-3 and Panadol. It is not blood thinner. The products listed below are.  Do not take any of the products listed below in addition to any listed on your instruction sheet.  A.P.C or A.P.C with Codeine Codeine Phosphate Capsules #3 Ibuprofen Ridaura  ABC compound Congesprin Imuran rimadil  Advil Cope Indocin Robaxisal  Alka-Seltzer Effervescent Pain Reliever and Antacid Coricidin or Coricidin-D  Indomethacin Rufen  Alka-Seltzer plus Cold Medicine Cosprin Ketoprofen S-A-C Tablets  Anacin Analgesic Tablets or Capsules Coumadin Korlgesic Salflex  Anacin Extra Strength Analgesic tablets or capsules CP-2 Tablets Lanoril Salicylate  Anaprox Cuprimine Capsules Levenox Salocol  Anexsia-D Dalteparin Magan Salsalate  Anodynos Darvon compound Magnesium Salicylate Sine-off  Ansaid Dasin Capsules Magsal Sodium Salicylate  Anturane Depen Capsules Marnal Soma  APF Arthritis pain formula Dewitt's Pills Measurin Stanback  Argesic Dia-Gesic Meclofenamic  Sulfinpyrazone  Arthritis Bayer Timed Release Aspirin Diclofenac Meclomen Sulindac  Arthritis pain formula Anacin Dicumarol Medipren Supac  Analgesic (Safety coated) Arthralgen Diffunasal Mefanamic Suprofen  Arthritis Strength Bufferin Dihydrocodeine Mepro Compound Suprol  Arthropan liquid Dopirydamole Methcarbomol with Aspirin Synalgos  ASA tablets/Enseals Disalcid Micrainin Tagament  Ascriptin Doan's Midol Talwin  Ascriptin A/D Dolene Mobidin Tanderil  Ascriptin Extra Strength Dolobid Moblgesic Ticlid  Ascriptin with Codeine Doloprin or Doloprin with Codeine Momentum Tolectin  Asperbuf Duoprin Mono-gesic Trendar  Aspergum Duradyne Motrin or Motrin IB Triminicin  Aspirin plain, buffered or enteric coated Durasal Myochrisine Trigesic  Aspirin Suppositories Easprin Nalfon Trillsate  Aspirin with Codeine Ecotrin Regular or Extra Strength Naprosyn Uracel  Atromid-S Efficin Naproxen Ursinus  Auranofin Capsules Elmiron Neocylate Vanquish  Axotal Emagrin Norgesic Verin  Azathioprine Empirin or Empirin with Codeine Normiflo Vitamin E  Azolid Emprazil Nuprin Voltaren  Bayer Aspirin plain, buffered or children's or timed BC Tablets or powders Encaprin Orgaran Warfarin Sodium  Buff-a-Comp Enoxaparin Orudis Zorpin  Buff-a-Comp with Codeine Equegesic Os-Cal-Gesic   Buffaprin Excedrin plain, buffered or Extra Strength Oxalid   Bufferin Arthritis Strength Feldene Oxphenbutazone   Bufferin plain or Extra Strength Feldene Capsules Oxycodone with Aspirin   Bufferin with Codeine Fenoprofen Fenoprofen Pabalate or Pabalate-SF   Buffets II Flogesic Panagesic   Buffinol plain or Extra Strength Florinal or Florinal with Codeine Panwarfarin   Buf-Tabs Flurbiprofen Penicillamine   Butalbital Compound Four-way cold tablets Penicillin   Butazolidin Fragmin Pepto-Bismol   Carbenicillin Geminisyn Percodan   Carna Arthritis Reliever Geopen Persantine   Carprofen Gold's salt Persistin   Chloramphenicol Goody's  Phenylbutazone   Chloromycetin Haltrain Piroxlcam   Clmetidine heparin Plaquenil   Cllnoril Hyco-pap Ponstel   Clofibrate Hydroxy chloroquine Propoxyphen         Before stopping any of these medications, be sure to consult the physician who ordered them.  Some, such as Coumadin (Warfarin) are ordered to prevent or treat serious conditions such as "deep thrombosis", "pumonary embolisms", and other heart problems.  The amount of time that you may need off of the medication may also vary with the medication and the reason for which you were taking it.  If you are taking any of these medications, please make sure you notify your pain physician before you undergo any procedures.

## 2015-07-27 ENCOUNTER — Telehealth: Payer: Self-pay | Admitting: *Deleted

## 2015-07-27 NOTE — Telephone Encounter (Signed)
Message left

## 2015-08-24 ENCOUNTER — Ambulatory Visit (INDEPENDENT_AMBULATORY_CARE_PROVIDER_SITE_OTHER): Payer: Medicare Other | Admitting: Cardiology

## 2015-08-24 ENCOUNTER — Encounter: Payer: Self-pay | Admitting: Cardiology

## 2015-08-24 VITALS — BP 106/54 | HR 81 | Ht 68.0 in | Wt 167.8 lb

## 2015-08-24 DIAGNOSIS — I350 Nonrheumatic aortic (valve) stenosis: Secondary | ICD-10-CM

## 2015-08-24 DIAGNOSIS — R000 Tachycardia, unspecified: Secondary | ICD-10-CM | POA: Diagnosis not present

## 2015-08-24 DIAGNOSIS — I10 Essential (primary) hypertension: Secondary | ICD-10-CM | POA: Diagnosis not present

## 2015-08-24 MED ORDER — METOPROLOL TARTRATE 25 MG PO TABS: 12.5000 mg | ORAL_TABLET | Freq: Every day | ORAL | Status: DC

## 2015-08-24 MED ORDER — HYDROCHLOROTHIAZIDE 25 MG PO TABS: 25.0000 mg | ORAL_TABLET | Freq: Every day | ORAL | Status: DC

## 2015-08-24 NOTE — Patient Instructions (Addendum)
Medication Instructions:  Your physician has recommended you make the following change in your medication:  1. Hydrochlorothiazide 25 mg Once Daily 2. Metoprolol 12.5 mg Once Daily   Labwork: Labs today BMP  Testing/Procedures: Your physician has requested that you have an echocardiogram. Echocardiography is a painless test that uses sound waves to create images of your heart. It provides your doctor with information about the size and shape of your heart and how well your heart's chambers and valves are working. This procedure takes approximately one hour. There are no restrictions for this procedure.  Date &Time:____________________________________________________________________  Follow-Up: Your physician recommends that you schedule a follow-up appointment in: 1 month with Dr. Yvone Neu  Date & Time: __________________________________________________________________   Any Other Special Instructions Will Be Listed Below (If Applicable).     If you need a refill on your cardiac medications before your next appointment, please call your pharmacy.  Echocardiogram An echocardiogram, or echocardiography, uses sound waves (ultrasound) to produce an image of your heart. The echocardiogram is simple, painless, obtained within a short period of time, and offers valuable information to your health care provider. The images from an echocardiogram can provide information such as:  Evidence of coronary artery disease (CAD).  Heart size.  Heart muscle function.  Heart valve function.  Aneurysm detection.  Evidence of a past heart attack.  Fluid buildup around the heart.  Heart muscle thickening.  Assess heart valve function. LET [facility] CARE PROVIDER KNOW ABOUT:  Any allergies you have.  All medicines you are taking, including vitamins, herbs, eye drops, creams, and over-the-counter medicines.  Previous problems you or members of your family have had with the use of  anesthetics.  Any blood disorders you have.  Previous surgeries you have had.  Medical conditions you have.  Possibility of pregnancy, if this applies. BEFORE THE PROCEDURE  No special preparation is needed. Eat and drink normally.  PROCEDURE   In order to produce an image of your heart, gel will be applied to your chest and a wand-like tool (transducer) will be moved over your chest. The gel will help transmit the sound waves from the transducer. The sound waves will harmlessly bounce off your heart to allow the heart images to be captured in real-time motion. These images will then be recorded.  You may need an IV to receive a medicine that improves the quality of the pictures. AFTER THE PROCEDURE You may return to your normal schedule including diet, activities, and medicines, unless your health care provider tells you otherwise.   This information is not intended to replace advice given to you by your health care provider. Make sure you discuss any questions you have with your health care provider.   Document Released: 03/17/2000 Document Revised: 04/10/2014 Document Reviewed: 11/25/2012 Elsevier Interactive Patient Education 2016 [place] Metabolic Panel WHY AM I HAVING THIS TEST? A basic metabolic panel measures levels of the following substances in your blood:   Glucose. Glucose is a simple sugar that serves as the main source of energy for your body.  Creatinine. Creatinine is a waste product of normal muscle activity. It is excreted from the body by the kidneys.  Blood urea nitrogen (BUN). Urea nitrogen is a waste product of protein breakdown. It is produced when excess protein in your body is broken down and used for energy. It is excreted by the kidneys.  Electrolytes. Electrolytes are negatively or positively charged particles that are dissolved in the water of different  body compartments. This includes the serum portion of blood, water inside cells, and  water outside cells. Concentrations of electrolytes vary among the different fluid compartments. Electrolytes are tightly regulated to maintain a salt-water and acid-base balance in the body. The electrolytes measured in a basic metabolic panel include:  Potassium.  Sodium.  Chloride.  Calcium.  Bicarbonate. WHAT KIND OF SAMPLE IS TAKEN? A blood sample is required for this test. It is usually collected by inserting a needle into a vein. HOW DO I PREPARE FOR THE TEST? Your health care provider may ask you to avoid eating or drinking anything before your blood sample is taken. Do not eat or drink anything after midnight on the night before the procedure or as directed by your health care provider. WHAT ARE THE REFERENCE RANGES? Reference ranges are considered healthy ranges established after testing a large group of healthy people. Reference ranges may vary among different people, labs, and hospitals. It is your responsibility to obtain your test results. Ask the lab or department performing the test when and how you will get your results. The following are reference ranges for each part of a basic metabolic panel: Glucose  Cord: 45-96 mg/dL or 2.5-5.3 mmol/L (SI units).  Premature infant: 20-60 mg/dL or 1.1-3.3 mmol/L.  Neonate: 30-60 mg/dL or 1.7-3.3 mmol/L.  Infant: 40-90 mg/dL or 2.2-5 mmol/L.  Child under 64 years old: 60-100 mg/dL or 3.3-5.5 mmol/L.  Adult or child over 85 years old:  Fasting: 70-110 mg/dL or less than 6.1 mmol/L.  Random (nonfasting or casual): less than or equal to 200 mg/dL or less than 11.1 mmol/L.  Elderly: increase in normal range after age 79 years. Creatinine  Child under 59 years old: 0.1-0.4 mg/dL.  Child 56-12 years old: 0.2-0.5 mg/dL.  Child 70-66 years old: 0.3-0.6 mg/dL.  Child or adolescent 29-49 years old: 0.4-1 mg/dL.  Adult 16-76 years old:  Male: 0.5-1 mg/dL.  Male: 0.6-1.2 mg/dL.  Adult 71-71 years old:  Male: 0.5-1.1  mg/dL.  Male: 0.6-1.3 mg/dL.  Adult 22 years old and above:  Male: 0.5-1.2 mg/dL.  Male: 0.7-1.3 mg/dL. BUN  Cord: 21-40 mg/dL.  Newborn: 3-12 mg/dL.  Infant: 5-18 mg/dL.  Child: 5-18 mg/dL.  Adult: 10-20 mg/dL or 3.6-7.1 mmol/L (SI units).  Elderly: may be slightly higher than adult. Potassium  Newborn: 3.9-5.9 mEq/L.  Infant: 4.1-5.3 mEq/L.  Child: 3.4-4.7 mEq/L.  Adult or elderly: 3.5-5 mEq/L or 3.5-5 mmol/L (SI units). Sodium  Newborn: 134-144 mEq/L.  Infant: 134-150 mEq/L.  Child: 136-145 mEq/L.  Adult or elderly: 136-145 mEq/L or 136-145 mmol/L (SI units). Chloride  Premature infant: 95-110 mEq/L.  Newborn: 96-106 mEq/L.  Child: 90-110 mEq/L.  Adult or elderly: 98-106 mEq/L or 98-106 mmol/L (SI units). Calcium  Total calcium:  Newborn under 39 days old: 7.6-10.4 mg/dL or 1.9-2.6 mmol/L.  Umbilical: [id number] mg/dL or 2.25-2.88 mmol/L.  45 days to 78 years old: 9-10.6 mg/dL or 2.30-2.65 mmol/L.  Child: 8.8-10.8 mg/dL or 2.2-2.7 mmol/L.  Adult: 9-10.5 mg/dL or 2.25-2.62 mmol/L.  Ionized calcium:  Newborn: 4.20-5.58 mg/dL or 1.05-1.37 mmol/L.  2 months to 78 years old: 4.80-5.52 mg/dL or 1.20-1.38 mmol/L.  Adult: 4.5-5.6 mg/dL or 1.05-1.30 mmol/L. Bicarbonate  Newborn: 13-22 mEq/L.  Infant: 20-28 mEq/L.  Child: 20-28 mEq/L.  Adult or elderly: 23-30 mEq/L or 23-30 mmol/L (SI units). WHAT DO THE RESULTS MEAN? Diet and levels of activity can have an effect on your test results. Sometimes they can be the cause of values that are outside of normal  limits. However, sometimes values outside normal limits can indicate a medical disorder.  Glucose:  Abnormally high glucose levels (hyperglycemia) are usually associated with prediabetes mellitus and diabetes mellitus. They can also occur with severe stress on the body. This can come from surgery or events such as stroke or trauma. Overactive thyroid gland and pancreatitis or pancreatic cancer  can also cause abnormally high glucose levels.  Abnormally low glucose levels (hypoglycemia) can occur with underactive thyroid gland and rare insulin-secreting tumors (insulinoma).  Creatinine:  Abnormally high creatinine levels are most commonly seen in kidney failure. They can also be seen with overactive thyroid (hyperthyroidism), conditions related to overgrowth of the body, abnormal breakdown of muscle tissue, and early muscular dystrophy.  Abnormally low creatinine levels can indicate low muscle mass associated with malnutrition or late-stage muscular dystrophy.  BUN:  Abnormally high BUN levels generally mean that your kidneys are not functioning normally.  Abnormally low BUN levels can be seen with malnutrition and liver failure.  Potassium:  Abnormally high potassium levels are most often seen with kidney disease, massive destruction of red blood cells, and adrenal gland failure.  Abnormally low potassium levels are seen with excessive levels of the hormone aldosterone.  Sodium:  Abnormally high sodium levels can be seen with dehydration, excessive thirst, and urination due to abnormally low levels of antidiuretic hormone (diabetes insipidus). They can also be seen with excessive levels of aldosterone or cortisol in the body.  Abnormally low levels of sodium can be seen with congestive heart failure, cirrhosis of the liver, kidney failure, and the syndrome of inappropriate antidiuretic hormone (SIADH). Chloride:  Abnormally high levels of chloride can be seen with acute kidney failure, diabetes insipidus, prolonged diarrhea, and poisoning with aspirin or bromide.  Abnormally low levels of chloride can be seen with prolonged vomiting, acute adrenal gland failure, and SIADH.  Calcium:  Abnormally high levels of calcium can occur with excessive activity of the parathyroid glands, certain cancers, and a type of inflammation seen in sarcoidosis and tuberculosis.  Abnormally  low levels of calcium can be seen with underactive parathyroid glands, vitamin D deficiency, and acute pancreatitis.  Bicarbonate:  Abnormally high bicarbonate levels are seen after prolonged vomiting and diuretic therapy, which lead to a decrease in the amount of acid in the body (metabolic alkalosis). They can also be seen in conditions that increase the amount of bicarbonate in the body. These conditions include rare hereditary disorders that interfere with how your kidneys handle electrolytes.  Abnormally low bicarbonate levels are seen with conditions that cause your body to produce too much acid (metabolic acidosis). These conditions include uncontrolled diabetes mellitus and poisoning with aspirin, methanol, or antifreeze (ethylene glycol). Talk with your health care provider to discuss your results, treatment options, and if necessary, the need for more tests. Talk with your health care provider if you have any questions about your results.   This information is not intended to replace advice given to you by your health care provider. Make sure you discuss any questions you have with your health care provider.   Document Released: 04/12/2004 Document Revised: 04/10/2014 Document Reviewed: 08/18/2013 Elsevier Interactive Patient Education [employer].

## 2015-08-24 NOTE — Progress Notes (Signed)
Cardiology Office Note   Date:  08/24/2015   ID:  Nathan Kramer, DOB 01/15/1936, MRN [id number]  Referring Doctor:  Tracie Harrier, MD   Cardiologist:   Wende Bushy, MD   Reason for consultation:  Chief Complaint  Patient presents with  . other    12 month F/U pt. c/o increasd LE swelling. verbally reviewed medications with pt.       History of Present Illness: Nathan Kramer is a 80 y.o. male who presents for ffup for AS  Patient has noted leg swelling for the last 3 days. He usually gets this and takes HCTZ for 2-3 days and it eventually resolves. He needs to refill on his HCTZ. He admits to salt intake.  Patient has had shortness of breath for quite some time now. According to the wife this has been going on for maybe one or 2 years. He can walk for 5 minutes and will need to rest because of the shortness of breath. He denies chest pain.  Patient denies chest pain and loss of consciousness. No fever, cough, colds, abdominal pain. No orthopnea and no PND.   ROS:  Please see the history of present illness. Aside from mentioned under HPI, all other systems are reviewed and negative.     Past Medical History  Diagnosis Date  . Other chronic pain   . Unspecified essential hypertension   . Unspecified visual loss   . Reflux esophagitis   . Undiagnosed cardiac murmurs   . Prostate cancer ([place])   . Anemia   . Stroke [facility])     Past Surgical History  Procedure Laterality Date  . Back surgery    . Colonoscopy       reports that he has been smoking Cigarettes.  He has a 5 pack-year smoking history. He does not have any smokeless tobacco history on file. He reports that he does not drink alcohol or use illicit drugs.   family history includes Cancer in his sister; Diabetes in his mother; Heart disease in his brother and father; Hypertension in his father and mother.   Current Outpatient Prescriptions  Medication Sig Dispense Refill  . esomeprazole (NEXIUM) 10  MG packet Take 20 mg by mouth daily before breakfast.    . ferrous fumarate (HEMOCYTE - 106 MG FE) 325 (106 FE) MG TABS tablet Take 1 tablet by mouth daily. Reported on 06/24/2015    . hydrochlorothiazide (HYDRODIURIL) 25 MG tablet Take 25 mg by mouth as needed.     [name] HYDROcodone-acetaminophen (NORCO) 10-325 MG tablet Take 0.5 tablets by mouth 2 (two) times daily.    . metoprolol tartrate (LOPRESSOR) 25 MG tablet Take 12.5 mg by mouth daily as needed.     [name] morphine (MS CONTIN) 30 MG 12 hr tablet Limit one tab by mouth every  8 - 12  hours if tolerated 90 tablet 0  . Nutritional Supplements (ESTROVEN NIGHTTIME) TABS Take by mouth daily.    . ciprofloxacin (CIPRO) 250 MG tablet Take 1 tablet (250 mg total) by mouth 2 (two) times daily. (Patient not taking: Reported on 08/24/2015) 14 tablet 0   Current Facility-Administered Medications  Medication Dose Route Frequency Provider Last Rate Last Dose  . bupivacaine (PF) (MARCAINE) 0.25 % injection 30 mL  30 mL Other Once Mohammed Kindle, MD      . ciprofloxacin (CIPRO) IVPB 400 mg  400 mg Intravenous Once Mohammed Kindle, MD      . fentaNYL (SUBLIMAZE) injection 100 mcg  100 mcg Intravenous Once Mohammed Kindle, MD      . lactated ringers infusion 1,000 mL  1,000 mL Intravenous Continuous Mohammed Kindle, MD      . lidocaine (PF) (XYLOCAINE) 1 % injection 10 mL  10 mL Subcutaneous Once Mohammed Kindle, MD      . midazolam (VERSED) 5 MG/5ML injection 5 mg  5 mg Intravenous Once Mohammed Kindle, MD      . orphenadrine (NORFLEX) injection 60 mg  60 mg Intramuscular Once Mohammed Kindle, MD      . triamcinolone acetonide (KENALOG-40) injection 40 mg  40 mg Other Once Mohammed Kindle, MD        Allergies: Amoxicillin and Reglan    PHYSICAL EXAM: VS:  BP 106/54 mmHg  Pulse 81  Ht 5\' 8"  (1.727 m)  Wt 167 lb 12.8 oz (76.114 kg)  BMI 25.52 kg/m2 , Body mass index is 25.52 kg/(m^2). Wt Readings from Last 3 Encounters:  08/24/15 167 lb 12.8 oz (76.114 kg)  07/26/15 158  lb (71.668 kg)  07/22/15 157 lb (71.215 kg)    GENERAL:  well developed, well nourished, not in acute distress HEENT: normocephalic, pink conjunctivae, anicteric sclerae, no xanthelasma, normal dentition, oropharynx clear NECK:  no neck vein engorgement, JVP normal, no hepatojugular reflux, carotid upstroke brisk and symmetric, no bruit, no thyromegaly, no lymphadenopathy LUNGS:  good respiratory effort, clear to auscultation bilaterally CV:  PMI not displaced, no thrills, no lifts, S1 and S2 within normal limits, no palpable S3 or S4, no rubs, no gallops, 3/6 systolic ejection murmur consistent with aortic stenosis, nonradiating, no delay in carotid upstroke ABD:  Soft, nontender, nondistended, normoactive bowel sounds, no abdominal aortic bruit, no hepatomegaly, no splenomegaly MS: nontender back, no kyphosis, no scoliosis, no joint deformities EXT:  2+ DP/PT pulses, no edema, no varicosities, no cyanosis, no clubbing SKIN: warm, nondiaphoretic, normal turgor, no ulcers NEUROPSYCH: alert, oriented to person, place, and time, sensory/motor grossly intact, normal mood, appropriate affect  Recent Labs: No results found for requested labs within last 365 days.   Lipid Panel No results found for: CHOL, TRIG, HDL, CHOLHDL, VLDL, LDLCALC, LDLDIRECT   Other studies Reviewed:  EKG:  The ekg from 08/24/2015 was personally reviewed by me and it revealed sinus rhythm, 81 BPM. T wave inversions. Poor R-wave progression.  Additional studies/ records that were reviewed personally reviewed by me today include: None available   ASSESSMENT AND PLAN:  Aortic stenosis Aortic insufficiency Previous medical records reports mild to moderate aortic stenosis Recommend echocardiogram for evaluation. If his aortic stenosis is stable, recommend to proceed with stress testing for his shortness of breath.  Hypertension BP is well controlled. Continue monitoring BP. Continue current medical therapy and  lifestyle changes. Continue metoprolol 12.5 mg by mouth daily.  Leg swelling Patient admits to dietary indiscretion. We will refill his HCTZ. We will check a BMP today.    Current medicines are reviewed at length with the patient today.  The patient does not have concerns regarding medicines.  Labs/ tests ordered today include:  Orders Placed This Encounter  Procedures  . EKG 12-Lead    I had a lengthy and detailed discussion with the patient regarding diagnoses, prognosis, diagnostic options, treatment options , and side effects of medications.   I counseled the patient on importance of lifestyle modification including heart healthy diet, regular physical activity .   Disposition:   FU with undersignedIn one month Signed, Wende Bushy, MD  08/24/2015 10:08 AM  [facility]

## 2015-08-25 LAB — BASIC METABOLIC PANEL
BUN / CREAT RATIO: 12 (ref 10–24)
BUN: 14 mg/dL (ref 8–27)
CHLORIDE: 99 mmol/L (ref 96–106)
CO2: 23 mmol/L (ref 18–29)
Calcium: 9.2 mg/dL (ref 8.6–10.2)
Creatinine, Ser: 1.2 mg/dL (ref 0.76–1.27)
GFR calc Af Amer: 67 mL/min/{1.73_m2} (ref >59)
GFR calc non Af Amer: 58 mL/min/{1.73_m2} — ABNORMAL LOW (ref >59)
GLUCOSE: 96 mg/dL (ref 65–99)
Potassium: 4.9 mmol/L (ref 3.5–5.2)
SODIUM: 140 mmol/L (ref 134–144)

## 2015-08-26 ENCOUNTER — Encounter: Payer: Self-pay | Admitting: Pain Medicine

## 2015-08-26 ENCOUNTER — Ambulatory Visit: Payer: Medicare Other | Attending: Pain Medicine | Admitting: Pain Medicine

## 2015-08-26 VITALS — BP 133/56 | HR 80 | Temp 98.0°F | Resp 18 | Ht 68.0 in | Wt 160.0 lb

## 2015-08-26 DIAGNOSIS — M542 Cervicalgia: Secondary | ICD-10-CM | POA: Diagnosis present

## 2015-08-26 DIAGNOSIS — M5136 Other intervertebral disc degeneration, lumbar region: Secondary | ICD-10-CM | POA: Diagnosis not present

## 2015-08-26 DIAGNOSIS — M19019 Primary osteoarthritis, unspecified shoulder: Secondary | ICD-10-CM | POA: Diagnosis not present

## 2015-08-26 DIAGNOSIS — M47816 Spondylosis without myelopathy or radiculopathy, lumbar region: Secondary | ICD-10-CM | POA: Insufficient documentation

## 2015-08-26 DIAGNOSIS — M4806 Spinal stenosis, lumbar region: Secondary | ICD-10-CM | POA: Diagnosis not present

## 2015-08-26 DIAGNOSIS — M5030 Other cervical disc degeneration, unspecified cervical region: Secondary | ICD-10-CM | POA: Insufficient documentation

## 2015-08-26 DIAGNOSIS — M2578 Osteophyte, vertebrae: Secondary | ICD-10-CM | POA: Insufficient documentation

## 2015-08-26 DIAGNOSIS — M170 Bilateral primary osteoarthritis of knee: Secondary | ICD-10-CM | POA: Insufficient documentation

## 2015-08-26 DIAGNOSIS — M47817 Spondylosis without myelopathy or radiculopathy, lumbosacral region: Secondary | ICD-10-CM

## 2015-08-26 DIAGNOSIS — M533 Sacrococcygeal disorders, not elsewhere classified: Secondary | ICD-10-CM | POA: Diagnosis not present

## 2015-08-26 DIAGNOSIS — M5134 Other intervertebral disc degeneration, thoracic region: Secondary | ICD-10-CM

## 2015-08-26 DIAGNOSIS — M48062 Spinal stenosis, lumbar region with neurogenic claudication: Secondary | ICD-10-CM

## 2015-08-26 DIAGNOSIS — M545 Low back pain: Secondary | ICD-10-CM | POA: Diagnosis present

## 2015-08-26 MED ORDER — MORPHINE SULFATE ER 30 MG PO TBCR: EXTENDED_RELEASE_TABLET | ORAL | Status: DC

## 2015-08-26 MED ORDER — HYDROCODONE-ACETAMINOPHEN 10-325 MG PO TABS: ORAL_TABLET | ORAL | Status: DC

## 2015-08-26 NOTE — Progress Notes (Signed)
Safety precautions to be maintained throughout the outpatient stay will include: orient to surroundings, keep bed in low position, maintain call bell within reach at all times, provide assistance with transfer out of bed and ambulation.  

## 2015-08-26 NOTE — Patient Instructions (Signed)
PLAN   Continue present medication hydrocodone acetaminophen and MS Contin(morphine sulfate extended release)  F/U PCP Dr.Hande  for evaliation of  BP and general medical  condition  F/U surgical evaluation. May consider pending follow-up evaluations Consider wearing brace on knees as previously discussed  F/U neurological evaluation. May consider PNCV/EMG studies and other studies pending follow-up evaluations  May consider radiofrequency rhizolysis or intraspinal procedures pending response to present treatment and F/U evaluation We will avoid such treatment at this time  Patient to call Pain Management Center should patient have concerns prior to scheduled return appointment

## 2015-08-27 NOTE — Progress Notes (Signed)
Subjective:    Patient ID: Nathan Kramer, male    DOB: Jul 19, 1935, 80 y.o.   MRN: [id number]  HPI  The patient is a 80 year old gentleman who returns to pain management for further evaluation and treatment of pain involving the region of the lower back and lower extremity region with pain of the neck and shoulder of lesser degree. The patient also has had pain involving the knees which is responded well to geniculate nerve blocks of the knee. The patient is undergone interventional treatment of the lumbar region is well with improvement of his pain and has decreased pain of the shoulder following interventional treatment. We discussed patient's condition and patient will continue MS Contin with hydrocodone acetaminophen for breakthrough pain. The patient denies any trauma change in events of daily living the call significant change in symptomatology. The patient is able to perform most activities of daily living without experiencing severe disabling pain. All agreed to suggested treatment plan     Review of Systems     Objective:   Physical Exam  There was mild tenderness of the splenius capitis and occipitalis musculature regions with mild tenderness of the cervical facet cervical paraspinal musculature region and limited range of motion of the cervical spine with flexion extension lateral bending rotation all limited significantly. The patient was with unremarkable Spurling's maneuver and was with tenderness to palpation of the acromioclavicular and glenohumeral joint region a moderate degree with limited range of motion of the left shoulder especially and with mild difficulty performing drop test. Tinel and Phalen's maneuver were without increased pain of significant degree. Palpation over the lower thoracic paraspinal musculature region was attends to palpation of moderate degree with no crepitus of the thoracic region noted. Palpation over the lumbar paraspinal musculature region lumbar  facet region was attends to palpation of moderate degree with lateral bending rotation extension and palpation of the lumbar facets reproducing moderate discomfort. Straight leg raising was tolerated to approximately 20 without a definite increased pain with dorsiflexion noted. The knees were with crepitus of the knees and tenderness to palpation of the knees of mild to moderate degree with negative anterior and posterior drawer signs without ballottement of the patella. EHL strength was decreased. There was negative clonus negative Homans. . Abdomen was nontender with no costovertebral tenderness noted      Assessment & Plan:    Degenerative joint disease of knees  Degenerative joint disease of shoulder  Degenerative disc disease of lumbar spine L2-3 L3-4 L4-5 L5-S1 degenerative changes with lumbar spondylosis with multilevel degenerative disc disease lumbar spine, multifactorial spinal and lateral recess stenosis and foraminal stenosis L3-4 diffuse disc bulging, osteophytic multifactorial spinal stenosis and lateral recess stenosis and foraminal stenosis spinal stenosis L4-L5 and similar changes L5-S1  Sacroiliac joint dysfunction  Degenerative disc disease cervical spine        PLAN   Continue present medication hydrocodone acetaminophen and MS Contin(morphine sulfate extended release)  F/U PCP Dr.Hande  for evaliation of  BP and general medical  condition  F/U surgical evaluation. May consider pending follow-up evaluations Patient admits to wearing brace which has been of some benefit involving decreased pain of the knee as well as interventional treatment decreasing pain of the knee significantly  F/U neurological evaluation. May consider PNCV/EMG studies and other studies pending follow-up evaluations  May consider radiofrequency rhizolysis or intraspinal procedures pending response to present treatment and F/U evaluation We will avoid such treatment at this time  Patient  to call Pain  Management Center should patient have concerns prior to scheduled return appointment

## 2015-09-07 ENCOUNTER — Ambulatory Visit (INDEPENDENT_AMBULATORY_CARE_PROVIDER_SITE_OTHER): Payer: Medicare Other

## 2015-09-07 ENCOUNTER — Other Ambulatory Visit: Payer: Self-pay

## 2015-09-07 DIAGNOSIS — I10 Essential (primary) hypertension: Secondary | ICD-10-CM

## 2015-09-07 DIAGNOSIS — I350 Nonrheumatic aortic (valve) stenosis: Secondary | ICD-10-CM

## 2015-09-07 LAB — ECHOCARDIOGRAM COMPLETE
AOASC: 37 cm
AOPV: 0.32 m/s
AOVTI: 72.2 cm
AV Area VTI index: 0.42 cm2/m2
AV Area mean vel: 0.71 cm2
AV Mean grad: 25 mmHg
AV Peak grad: 39 mmHg
AV VEL mean LVOT/AV: 0.28
AV area mean vel ind: 0.38 cm2/m2
AV pk vel: 313 cm/s
AVAREAVTI: 0.81 cm2
AVPHT: 302 ms
CHL CUP AV PEAK INDEX: 0.44
CHL CUP AV VEL: 0.79
CHL CUP LVOT MV VTI INDEX: 0.99 cm2/m2
DOP CAL AO MEAN VELOCITY: 237 cm/s
E decel time: 116 msec
E/e' ratio: 15
FS: 22 % — AB (ref 28–44)
IV/PV OW: 1.01
LA ID, A-P, ES: 43 mm
LA diam end sys: 43 mm
LA diam index: 2.31 cm/m2
LA vol index: 43.2 mL/m2
LAVOL: 80.4 mL
LAVOLA4C: 55.1 mL
LDCA: 2.54 cm2
LV TDI E'LATERAL: 7.8
LV e' LATERAL: 7.8 cm/s
LVEEAVG: 15
LVEEMED: 15
LVOT MV VTI: 1.84
LVOT VTI: 22.4 cm
LVOT peak vel: 99.2 cm/s
LVOTD: 18 mm
LVOTSV: 57 mL
LVOTVTI: 0.31 cm
MV Dec: 116
MV M vel: 60.9
MV Peak grad: 5 mmHg
MV pk E vel: 117 m/s
MVANNULUSVTI: 30.9 cm
MVPKAVEL: 64.5 m/s
Mean grad: 2 mmHg
PW: 10.6 mm — AB (ref 0.6–1.1)
Reg peak vel: 346 cm/s
TAPSE: 10.6 mm
TDI e' medial: 6.75
TR max vel: 346 cm/s
Valve area index: 0.42
Valve area: 0.79 cm2

## 2015-09-10 ENCOUNTER — Telehealth: Payer: Self-pay | Admitting: Cardiology

## 2015-09-10 NOTE — Telephone Encounter (Signed)
Call for test results

## 2015-09-10 NOTE — Telephone Encounter (Signed)
See result notes. 

## 2015-09-20 ENCOUNTER — Telehealth: Payer: Self-pay | Admitting: Gastroenterology

## 2015-09-20 NOTE — Telephone Encounter (Signed)
No, this is a urology issue. Please have wife contact PCP or his Urologist if he has one. We do not see prostate pain. Thanks!!

## 2015-09-20 NOTE — Telephone Encounter (Signed)
This is a patient of Nathan Kramer. Last saw Nathan Kramer in March 2016. Please call patients wife. Patient had constipation for a couple days and is now having prostate pain from straining. Patients wife thinks he needs to be seen by Nathan Kramer. She states he only had constipation because he did not take his stool softener for a couple days and no longer has constipation. He is just having prostate pain. I'm not sure if that is something Nathan Kramer can see patients for?

## 2015-09-21 ENCOUNTER — Ambulatory Visit (INDEPENDENT_AMBULATORY_CARE_PROVIDER_SITE_OTHER): Payer: Medicare Other | Admitting: Cardiology

## 2015-09-21 ENCOUNTER — Encounter: Payer: Self-pay | Admitting: Cardiology

## 2015-09-21 VITALS — BP 148/70 | HR 82 | Ht 68.0 in | Wt 167.0 lb

## 2015-09-21 DIAGNOSIS — R0602 Shortness of breath: Secondary | ICD-10-CM | POA: Diagnosis not present

## 2015-09-21 DIAGNOSIS — I351 Nonrheumatic aortic (valve) insufficiency: Secondary | ICD-10-CM | POA: Diagnosis not present

## 2015-09-21 DIAGNOSIS — I350 Nonrheumatic aortic (valve) stenosis: Secondary | ICD-10-CM | POA: Diagnosis not present

## 2015-09-21 DIAGNOSIS — I10 Essential (primary) hypertension: Secondary | ICD-10-CM | POA: Diagnosis not present

## 2015-09-21 DIAGNOSIS — R000 Tachycardia, unspecified: Secondary | ICD-10-CM | POA: Diagnosis not present

## 2015-09-21 NOTE — Patient Instructions (Addendum)
Medication Instructions:  Your physician recommends that you continue on your current medications as directed. Please refer to the Current Medication list given to you today.   Labwork: None ordered  Testing/Procedures: Please call when you would like to schedule your stress test.   [place]  Your caregiver has ordered a Stress Test with nuclear imaging. The purpose of this test is to evaluate the blood supply to your heart muscle. This procedure is referred to as a "Non-Invasive Stress Test." This is because other than having an IV started in your vein, nothing is inserted or "invades" your body. Cardiac stress tests are done to find areas of poor blood flow to the heart by determining the extent of coronary artery disease (CAD). Some patients exercise on a treadmill, which naturally increases the blood flow to your heart, while others who are  unable to walk on a treadmill due to physical limitations have a pharmacologic/chemical stress agent called Lexiscan . This medicine will mimic walking on a treadmill by temporarily increasing your coronary blood flow.   Please note: these test may take anywhere between 2-4 hours to complete  PLEASE REPORT TO [place] AT THE FIRST DESK WILL DIRECT YOU WHERE TO GO  Date of Procedure:_____________________________________  Arrival Time for Procedure:______________________________  Instructions regarding medication:   ____ : Hold diabetes medication morning of procedure  ____:  Hold metoprolol the night before procedure and morning of procedure    PLEASE NOTIFY THE OFFICE AT LEAST 24 HOURS IN ADVANCE IF YOU ARE UNABLE TO KEEP YOUR APPOINTMENT.  [phone] AND  PLEASE NOTIFY NUCLEAR MEDICINE AT [facility] AT LEAST 24 HOURS IN ADVANCE IF YOU ARE UNABLE TO KEEP YOUR APPOINTMENT. [phone]  How to prepare for your Myoview test:   Do not eat or drink after midnight  No caffeine for 24 hours prior to  test  No smoking 24 hours prior to test.  Your medication may be taken with water.  If your doctor stopped a medication because of this test, do not take that medication.  Ladies, please do not wear dresses.  Skirts or pants are appropriate. Please wear a short sleeve shirt.  No perfume, cologne or lotion.  Wear comfortable walking shoes. No heels!   Follow-Up:  Your physician recommends that you schedule a follow-up appointment in 2 months with Dr. Yvone Neu.    Date & Time: ______________________________________________________   It was a pleasure seeing you today here in the office. Please do not hesitate to give [place] a call back if you have any further questions. [place], BSN     Any Other Special Instructions Will Be Listed Below (If Applicable).     If you need a refill on your cardiac medications before your next appointment, please call your pharmacy.  Pharmacologic Stress Electrocardiogram A pharmacologic stress electrocardiogram is a heart (cardiac) test that uses nuclear imaging to evaluate the blood supply to your heart. This test may also be called a pharmacologic stress electrocardiography. Pharmacologic means that a medicine is used to increase your heart rate and blood pressure.  This stress test is done to find areas of poor blood flow to the heart by determining the extent of coronary artery disease (CAD). Some people exercise on a treadmill, which naturally increases the blood flow to the heart. For those people unable to exercise on a treadmill, a medicine is used. This medicine stimulates your heart and will cause your heart to beat harder  and more quickly, as if you were exercising.  Pharmacologic stress tests can help determine:  The adequacy of blood flow to your heart during increased levels of activity in order to clear you for discharge home.  The extent of coronary artery blockage caused by CAD.  Your prognosis if you have suffered a  heart attack.  The effectiveness of cardiac procedures done, such as an angioplasty, which can increase the circulation in your coronary arteries.  Causes of chest pain or pressure. LET [facility] CARE PROVIDER KNOW ABOUT:  Any allergies you have.  All medicines you are taking, including vitamins, herbs, eye drops, creams, and over-the-counter medicines.  Previous problems you or members of your family have had with the use of anesthetics.  Any blood disorders you have.  Previous surgeries you have had.  Medical conditions you have.  Possibility of pregnancy, if this applies.  If you are currently breastfeeding. RISKS AND COMPLICATIONS Generally, this is a safe procedure. However, as with any procedure, complications can occur. Possible complications include:  You develop pain or pressure in the following areas:  Chest.  Jaw or neck.  Between your shoulder blades.  Radiating down your left arm.  Headache.  Dizziness or light-headedness.  Shortness of breath.  Increased or irregular heartbeat.  Low blood pressure.  Nausea or vomiting.  Flushing.  Redness going up the arm and slight pain during injection of medicine.  Heart attack (rare). BEFORE THE PROCEDURE   Avoid all forms of caffeine for 24 hours before your test or as directed by your health care provider. This includes coffee, tea (even decaffeinated tea), caffeinated sodas, chocolate, cocoa, and certain pain medicines.  Follow your health care provider's instructions regarding eating and drinking before the test.  Take your medicines as directed at regular times with water unless instructed otherwise. Exceptions may include:  If you have diabetes, ask how you are to take your insulin or pills. It is common to adjust insulin dosing the morning of the test.  If you are taking beta-blocker medicines, it is important to talk to your health care provider about these medicines well before the date of  your test. Taking beta-blocker medicines may interfere with the test. In some cases, these medicines need to be changed or stopped 24 hours or more before the test.  If you wear a nitroglycerin patch, it may need to be removed prior to the test. Ask your health care provider if the patch should be removed before the test.  If you use an inhaler for any breathing condition, bring it with you to the test.  If you are an outpatient, bring a snack so you can eat right after the stress phase of the test.  Do not smoke for 4 hours prior to the test or as directed by your health care provider.  Do not apply lotions, powders, creams, or oils on your chest prior to the test.  Wear comfortable shoes and clothing. Let your health care provider know if you were unable to complete or follow the preparations for your test. PROCEDURE   Multiple patches (electrodes) will be put on your chest. If needed, small areas of your chest may be shaved to get better contact with the electrodes. Once the electrodes are attached to your body, multiple wires will be attached to the electrodes, and your heart rate will be monitored.  An IV access will be started. A nuclear trace (isotope) is given. The isotope may be given intravenously, or it  may be swallowed. Nuclear refers to several types of radioactive isotopes, and the nuclear isotope lights up the arteries so that the nuclear images are clear. The isotope is absorbed by your body. This results in low radiation exposure.  A resting nuclear image is taken to show how your heart functions at rest.  A medicine is given through the IV access.  A second scan is done about 1 hour after the medicine injection and determines how your heart functions under stress.  During this stress phase, you will be connected to an electrocardiogram machine. Your blood pressure and oxygen levels will be monitored. AFTER THE PROCEDURE   Your heart rate and blood pressure will be  monitored after the test.  You may return to your normal schedule, including diet,activities, and medicines, unless your health care provider tells you otherwise.   This information is not intended to replace advice given to you by your health care provider. Make sure you discuss any questions you have with your health care provider.   Document Released: 08/06/2008 Document Revised: 03/25/2013 Document Reviewed: 11/25/2012 Elsevier Interactive Patient Education [employer].

## 2015-09-21 NOTE — Progress Notes (Signed)
Cardiology Office Note   Date:  09/21/2015   ID:  Nathan Kramer, DOB [birth year], MRN [id number]  Referring Doctor:  Tracie Harrier, MD   Cardiologist:   Wende Bushy, MD   Reason for consultation:  Chief Complaint  Patient presents with  . other    1 month f/u c/o weakness with metoprolol. Meds reviewed verbally with pt.      History of Present Illness: Nathan Kramer is a 80 y.o. male who presents for ffup for AS   patient continues to have shortness of breath. Patient is not very forthcoming with his symptoms. According to the wife, he stops multiple times with walking due to many reasons. One is shortness of breath and another reason sometimes is back pain and leg pain brought on by significant spine /back problems.  Patient denies chest pain and loss of consciousness. No fever, cough, colds, abdominal pain. No orthopnea and no PND.   ROS:  Please see the history of present illness. Aside from mentioned under HPI, all other systems are reviewed and negative.     Past Medical History  Diagnosis Date  . Other chronic pain   . Unspecified essential hypertension   . Unspecified visual loss   . Reflux esophagitis   . Undiagnosed cardiac murmurs   . Prostate cancer ([place])   . Anemia   . Stroke [facility])     Past Surgical History  Procedure Laterality Date  . Back surgery    . Colonoscopy       reports that he has been smoking Cigarettes.  He has a 5 pack-year smoking history. He does not have any smokeless tobacco history on file. He reports that he does not drink alcohol or use illicit drugs.   family history includes Cancer in his sister; Diabetes in his mother; Heart disease in his brother and father; Hypertension in his father and mother.   Current Outpatient Prescriptions  Medication Sig Dispense Refill  . esomeprazole (NEXIUM) 10 MG packet Take 20 mg by mouth daily before breakfast.    . ferrous fumarate (HEMOCYTE - 106 MG FE) 325 (106 FE) MG TABS  tablet Take 1 tablet by mouth daily. Reported on 06/24/2015    . hydrochlorothiazide (HYDRODIURIL) 25 MG tablet Take 1 tablet (25 mg total) by mouth daily. 30 tablet 6  . HYDROcodone-acetaminophen (NORCO) 10-325 MG tablet Limit 1 tablet by mouth 2-4 times per day for breakthrough pain while taking MS Contin if tolerated (Patient taking differently: Take 1 tablet by mouth as needed. Limit 1 tablet by mouth 2-4 times per day for breakthrough pain while taking MS Contin if tolerated) 120 tablet 0  . metoprolol tartrate (LOPRESSOR) 25 MG tablet Take 0.5 tablets (12.5 mg total) by mouth daily. (Patient taking differently: Take 25 mg by mouth daily as needed. ) 30 tablet 6  . morphine (MS CONTIN) 30 MG 12 hr tablet Limit one tab by mouth every  8 - 12  hours if tolerated 90 tablet 0  . Nutritional Supplements (ESTROVEN NIGHTTIME) TABS Take by mouth daily.     Current Facility-Administered Medications  Medication Dose Route Frequency Provider Last Rate Last Dose  . bupivacaine (PF) (MARCAINE) 0.25 % injection 30 mL  30 mL Other Once Mohammed Kindle, MD      . ciprofloxacin (CIPRO) IVPB 400 mg  400 mg Intravenous Once Mohammed Kindle, MD      . fentaNYL (SUBLIMAZE) injection 100 mcg  100 mcg Intravenous Once Mohammed Kindle, MD      .  lactated ringers infusion 1,000 mL  1,000 mL Intravenous Continuous Mohammed Kindle, MD      . lidocaine (PF) (XYLOCAINE) 1 % injection 10 mL  10 mL Subcutaneous Once Mohammed Kindle, MD      . midazolam (VERSED) 5 MG/5ML injection 5 mg  5 mg Intravenous Once Mohammed Kindle, MD      . orphenadrine (NORFLEX) injection 60 mg  60 mg Intramuscular Once Mohammed Kindle, MD      . triamcinolone acetonide (KENALOG-40) injection 40 mg  40 mg Other Once Mohammed Kindle, MD        Allergies: Amoxicillin and Reglan    PHYSICAL EXAM: VS:  BP 148/70 mmHg  Pulse 82  Ht 5\' 8"  (1.727 m)  Wt 167 lb (75.751 kg)  BMI 25.40 kg/m2 , Body mass index is 25.4 kg/(m^2). Wt Readings from Last 3 Encounters:    09/21/15 167 lb (75.751 kg)  08/26/15 160 lb (72.576 kg)  08/24/15 167 lb 12.8 oz (76.114 kg)    GENERAL:  well developed, well nourished, not in acute distress HEENT: normocephalic, pink conjunctivae, anicteric sclerae, no xanthelasma, normal dentition, oropharynx clear NECK:  no neck vein engorgement, JVP normal, no hepatojugular reflux, carotid upstroke brisk and symmetric, no bruit, no thyromegaly, no lymphadenopathy LUNGS:  good respiratory effort, clear to auscultation bilaterally CV:  PMI not displaced, no thrills, no lifts, S1 and S2 within normal limits, no palpable S3 or S4, no rubs, no gallops, 3/6 systolic ejection murmur consistent with aortic stenosis, nonradiating, no delay in carotid upstroke ABD:  Soft, nontender, nondistended, normoactive bowel sounds, no abdominal aortic bruit, no hepatomegaly, no splenomegaly MS: nontender back, no kyphosis, no scoliosis, no joint deformities EXT:  2+ DP/PT pulses,  +1 edema, no varicosities, no cyanosis, no clubbing SKIN: warm, nondiaphoretic, normal turgor, no ulcers NEUROPSYCH: alert, oriented to person, place, and time, sensory/motor grossly intact, normal mood, appropriate affect  Recent Labs: 08/24/2015: BUN 14; Creatinine, Ser 1.20; Potassium 4.9; Sodium 140   Lipid Panel No results found for: CHOL, TRIG, HDL, CHOLHDL, VLDL, LDLCALC, LDLDIRECT   Other studies Reviewed:  EKG:  The ekg from 08/24/2015 was personally reviewed by me and it revealed sinus rhythm, 81 BPM. T wave inversions. Poor R-wave progression.  Additional studies/ records that were reviewed personally reviewed by me today include:  Echo 09/07/2015: Left ventricle: The cavity size was normal. There was mild  concentric hypertrophy. Systolic function was normal. The  estimated ejection fraction was in the range of 55% to 60%. Wall  motion was normal; there were no regional wall motion  abnormalities. Features are consistent with a pseudonormal left   ventricular filling pattern, with concomitant abnormal relaxation  and increased filling pressure (grade 2 diastolic dysfunction). - Aortic valve: There was moderate to severe stenosis. There was  moderate regurgitation. Mean gradient (S): 25 mm Hg. Valve area  (VTI): 0.84 cm^2. Valve area (Vmax): 0.81 cm^2. - Aorta: Ascending aortic diameter: 37 mm (S). - Mitral valve: Calcified annulus. Mildly thickened, moderately  calcified leaflets . The findings are consistent with mild  stenosis. Valve area by continuity equation (using LVOT flow):  1.84 cm^2. - Left atrium: The atrium was mildly dilated. - Pulmonary arteries: Systolic pressure was moderately increased.  PA peak pressure: 55 mm Hg (S).  Aortic valve Value Reference Aortic valve peak velocity, S 313 cm/s --------- Aortic valve mean velocity, S 237 cm/s --------- Aortic valve VTI, S 72.2 cm --------- Aortic mean gradient, S 25 mm Hg --------- Aortic peak  gradient, S 39 mm Hg --------- VTI ratio, LVOT/AV 0.31 --------- Aortic valve area, VTI 0.84 cm^2 --------- Aortic valve area/bsa, VTI 0.45 cm^2/m^2 --------- Velocity ratio, peak, LVOT/AV 0.32 --------- Aortic valve area, peak velocity 0.81 cm^2 --------- Aortic valve area/bsa, peak 0.43 cm^2/m^2 --------- velocity Velocity ratio, mean, LVOT/AV 0.28 --------- Aortic valve area, mean velocity 0.71 cm^2 --------- Aortic valve area/bsa, mean 0.38 cm^2/m^2 --------- velocity Aortic regurg pressure half-time 302 ms ---------   ASSESSMENT AND PLAN:  Aortic stenosis Aortic insufficiency Previous medical records  reports mild to moderate aortic stenosis  most recent echocardiogram reveals moderate severe aortic stenosis and moderate aortic insufficiency.  Peak aortic velocity is less than 4 m/s. Mean aortic  Recommend to proceed with  Stress testing to determine if shortness of breath is related to coronary artery disease.  Patient initially said that he will not be able to lie flat on the table for this test. We discussed possibility of proceeding with right left heart catheterization for further evaluation. Patient's wife was in the room for the discussion. At this point, they're both not certain if they would like to proceed with any invasive testing. At one point, patient decided that he can start off with stress testing and therefore we discussed to schedule it.   Hypertension BP is well controlled. Continue monitoring BP. Continue current medical therapy and lifestyle changes.  Leg swelling Patient admits to dietary indiscretion.  Continue HCTZ.   Current medicines are reviewed at length with the patient today.  The patient does not have concerns regarding medicines.  Labs/ tests ordered today include:  Orders Placed This Encounter  Procedures  . NM Myocar Multi W/Spect W/Wall Motion / EF  . EKG 12-Lead    I had a lengthy and detailed discussion with the patient regarding diagnoses, prognosis, diagnostic options, treatment options , and side effects of medications.   I counseled the patient on importance of lifestyle modification including heart healthy diet, regular physical activity .   Disposition:   FU with undersigned  In 2 months  I spent at least 40 minutes with the patient today and more than 50% of the time was spent counseling the patient and coordinating care.        Signed, Wende Bushy, MD  09/21/2015 12:49 PM    [place]

## 2015-09-22 ENCOUNTER — Ambulatory Visit: Payer: Medicare Other | Admitting: Cardiology

## 2015-09-22 ENCOUNTER — Encounter: Payer: Self-pay | Admitting: *Deleted

## 2015-09-22 ENCOUNTER — Telehealth: Payer: Self-pay | Admitting: Cardiology

## 2015-09-22 NOTE — Telephone Encounter (Signed)
Pt wife calling asking stress test we were going to do on patient.  July 10 th pt has cancer screening can't do it that day. Would like another day.  Please call back to schedule.

## 2015-09-22 NOTE — Telephone Encounter (Signed)
Patients wife wanted to move forward and schedule stress test for her husband per release form. Lexiscan scheduled for 10/04/15 at 08:00AM and reviewed all instructions with her at length. She verbalized understanding of all instructions and had no further questions at this time.

## 2015-09-28 ENCOUNTER — Encounter: Payer: Self-pay | Admitting: Pain Medicine

## 2015-09-28 ENCOUNTER — Ambulatory Visit: Payer: Medicare Other | Attending: Pain Medicine | Admitting: Pain Medicine

## 2015-09-28 VITALS — BP 136/76 | HR 68 | Temp 97.5°F | Resp 16 | Ht 68.0 in | Wt 160.0 lb

## 2015-09-28 DIAGNOSIS — M47816 Spondylosis without myelopathy or radiculopathy, lumbar region: Secondary | ICD-10-CM | POA: Diagnosis not present

## 2015-09-28 DIAGNOSIS — M5030 Other cervical disc degeneration, unspecified cervical region: Secondary | ICD-10-CM

## 2015-09-28 DIAGNOSIS — M170 Bilateral primary osteoarthritis of knee: Secondary | ICD-10-CM

## 2015-09-28 DIAGNOSIS — M533 Sacrococcygeal disorders, not elsewhere classified: Secondary | ICD-10-CM

## 2015-09-28 DIAGNOSIS — M2578 Osteophyte, vertebrae: Secondary | ICD-10-CM | POA: Diagnosis not present

## 2015-09-28 DIAGNOSIS — M5136 Other intervertebral disc degeneration, lumbar region: Secondary | ICD-10-CM

## 2015-09-28 DIAGNOSIS — M19019 Primary osteoarthritis, unspecified shoulder: Secondary | ICD-10-CM | POA: Diagnosis not present

## 2015-09-28 DIAGNOSIS — M51369 Other intervertebral disc degeneration, lumbar region without mention of lumbar back pain or lower extremity pain: Secondary | ICD-10-CM

## 2015-09-28 DIAGNOSIS — M25511 Pain in right shoulder: Secondary | ICD-10-CM | POA: Diagnosis present

## 2015-09-28 DIAGNOSIS — M5134 Other intervertebral disc degeneration, thoracic region: Secondary | ICD-10-CM

## 2015-09-28 DIAGNOSIS — M5126 Other intervertebral disc displacement, lumbar region: Secondary | ICD-10-CM | POA: Diagnosis not present

## 2015-09-28 DIAGNOSIS — M62830 Muscle spasm of back: Secondary | ICD-10-CM | POA: Insufficient documentation

## 2015-09-28 DIAGNOSIS — M25512 Pain in left shoulder: Secondary | ICD-10-CM | POA: Diagnosis present

## 2015-09-28 DIAGNOSIS — M4806 Spinal stenosis, lumbar region: Secondary | ICD-10-CM | POA: Insufficient documentation

## 2015-09-28 DIAGNOSIS — M542 Cervicalgia: Secondary | ICD-10-CM | POA: Diagnosis present

## 2015-09-28 DIAGNOSIS — M48062 Spinal stenosis, lumbar region with neurogenic claudication: Secondary | ICD-10-CM

## 2015-09-28 DIAGNOSIS — M47817 Spondylosis without myelopathy or radiculopathy, lumbosacral region: Secondary | ICD-10-CM

## 2015-09-28 MED ORDER — MORPHINE SULFATE ER 30 MG PO TBCR: EXTENDED_RELEASE_TABLET | ORAL | Status: DC

## 2015-09-28 MED ORDER — HYDROCODONE-ACETAMINOPHEN 10-325 MG PO TABS: ORAL_TABLET | ORAL | Status: DC

## 2015-09-28 NOTE — Patient Instructions (Addendum)
PLAN   Continue present medication hydrocodone acetaminophen and MS Contin(morphine sulfate extended release)  F/U PCP Dr.Hande  for evaliation of  BP and general medical  condition  F/U surgical evaluation. May consider pending follow-up evaluations  Wear brace on knees as previously discussed  F/U with cardiologist as planned  F/U with urologist as planned  F/U neurological evaluation. May consider PNCV/EMG studies and other studies pending follow-up evaluations  May consider radiofrequency rhizolysis or intraspinal procedures pending response to present treatment and F/U evaluation We will avoid such treatment at this time  Patient to call [facility] Management Center should patient have concerns prior to scheduled return appointment

## 2015-09-28 NOTE — Progress Notes (Signed)
Safety precautions to be maintained throughout the outpatient stay will include: orient to surroundings, keep bed in low position, maintain call bell within reach at all times, provide assistance with transfer out of bed and ambulation.  

## 2015-09-28 NOTE — Progress Notes (Signed)
Subjective:    Patient ID: Nathan Kramer, male    DOB: 01/08/1936, 80 y.o.   MRN: [id number]  HPI  The patient is a 80 year old gentleman who returns to pain management for further evaluation and treatment of pain involving the neck shoulders and upper extremity regions lower back lower extremity region and region of the knees. At the present time patient is to undergo further evaluation including cardiac evaluation. The patient is to be considered for eval replacement. The patient is with prior history of cardiac surgery. The patient also will undergo follow-up evaluation with urologist. The patient has history of carcinoma the prostate and has had some difficulty voiding. We discussed patient's condition on today's visit the patient is tolerating medications MS Contin and hydrocodone acetaminophen well without undesirable side effects. We will continue presently prescribed medications and we will remain available to consider modification of treatment regimen including interventional treatment pending response to treatment and follow-up evaluation. All agreed to suggested treatment plan    Review of Systems     Objective:   Physical Exam  Tenderness of the paraspinal musculatures and cervical region cervical facet region palpation which reproduces mild discomfort with mild tenderness of the splenius capitis and occipitalis region. Palpation over the thoracic facet thoracic paraspinal musculature region was with moderate tenderness to palpation in the lower thoracic region with severely limited range of motion of the thoracic oh lumbar spine. There was severe muscle spasms of the lower thoracic paraspinal muscles region. The patient appeared to be with decreased range of motion of the shoulders with mild difficulty performing drop test. The patient appeared to be with unremarkable Spurling's maneuver. He appeared to be with bilaterally equal grip strength with Tinel and Phalen's maneuver  reproducing minimal discomfort. Palpation over the lumbar paraspinal musculature region lumbar facet region was with moderate tenderness to palpation to severe tenderness to palpation with severely limited range of motion of the lumbosacral spine. There was moderate tenderness of the PSIS and PII S region. Palpation of the greater trochanteric region iliotibial band region was with mild discomfort. The knees were attends to palpation in crepitus of the knees with negative anterior and posterior drawer signs without ballottement of the patella. No sensory deficit or dermatomal distribution detected. There was negative clonus negative Homans. DTRs were difficult to elicit patient had difficulty relaxing. Abdomen nontender with no costovertebral angle tenderness noted.      Assessment & Plan:    Degenerative joint disease of knees  Degenerative joint disease of shoulder  Degenerative disc disease of lumbar spine L2-3 L3-4 L4-5 L5-S1 degenerative changes with lumbar spondylosis with multilevel degenerative disc disease lumbar spine, multifactorial spinal and lateral recess stenosis and foraminal stenosis L3-4 diffuse disc bulging, osteophytic multifactorial spinal stenosis and lateral recess stenosis and foraminal stenosis spinal stenosis L4-L5 and similar changes L5-S1  Sacroiliac joint dysfunction  Degenerative disc disease cervical spine      PLAN   Continue present medication hydrocodone acetaminophen and MS Contin(morphine sulfate extended release)  F/U PCP Dr.Hande  for evaliation of  BP and general medical  condition  F/U surgical evaluation. May consider pending follow-up evaluations  Wear brace on knees as previously discussed  F/U with cardiologist as planned  F/U with urologist as planned  F/U neurological evaluation. May consider PNCV/EMG studies and other studies pending follow-up evaluations  May consider radiofrequency rhizolysis or intraspinal procedures pending  response to present treatment and F/U evaluation We will avoid such treatment at this time  Patient  to call Pain Management Center should patient have concerns prior to scheduled return appointment

## 2015-10-04 ENCOUNTER — Encounter
Admission: RE | Admit: 2015-10-04 | Discharge: 2015-10-04 | Disposition: A | Discharge status: Home / Self Care | Payer: Medicare Other | Source: Ambulatory Visit | Attending: Cardiology | Admitting: Cardiology

## 2015-10-04 DIAGNOSIS — I350 Nonrheumatic aortic (valve) stenosis: Secondary | ICD-10-CM | POA: Diagnosis not present

## 2015-10-04 DIAGNOSIS — R0602 Shortness of breath: Secondary | ICD-10-CM

## 2015-10-04 LAB — NM MYOCAR MULTI W/SPECT W/WALL MOTION / EF
CHL CUP NUCLEAR SDS: 0
CSEPHR: 53 %
CSEPPHR: 76 {beats}/min
LV dias vol: 127 mL (ref 62–150)
LVSYSVOL: 65 mL
SRS: 2
SSS: 6
TID: 1.01

## 2015-10-04 MED ORDER — REGADENOSON 0.4 MG/5ML IV SOLN
0.4000 mg | Freq: Once | INTRAVENOUS | Status: AC
  Administered 2015-10-04: 0.4 mg via INTRAVENOUS

## 2015-10-04 MED ORDER — TECHNETIUM TC 99M TETROFOSMIN IV KIT
30.0000 | PACK | Freq: Once | INTRAVENOUS | Status: AC | PRN
  Administered 2015-10-04: 32.3 via INTRAVENOUS

## 2015-10-04 MED ORDER — TECHNETIUM TC 99M TETROFOSMIN IV KIT
13.0000 | PACK | Freq: Once | INTRAVENOUS | Status: AC | PRN
  Administered 2015-10-04: 12.846 via INTRAVENOUS

## 2015-10-06 ENCOUNTER — Telehealth: Payer: Self-pay | Admitting: Cardiology

## 2015-10-06 NOTE — Telephone Encounter (Signed)
F/u Message  Pt wife returning RN call to discuss test results. Please call back to discuss

## 2015-10-06 NOTE — Telephone Encounter (Signed)
Follow Up:   Returning call,she did not know who called her.

## 2015-10-06 NOTE — Telephone Encounter (Signed)
Results reviewed. See results note.

## 2015-10-11 ENCOUNTER — Encounter: Payer: Self-pay | Admitting: Urology

## 2015-10-11 ENCOUNTER — Ambulatory Visit (INDEPENDENT_AMBULATORY_CARE_PROVIDER_SITE_OTHER): Payer: Medicare Other | Admitting: Urology

## 2015-10-11 VITALS — BP 123/61 | HR 68 | Ht 68.0 in | Wt 165.3 lb

## 2015-10-11 DIAGNOSIS — Z8546 Personal history of malignant neoplasm of prostate: Secondary | ICD-10-CM | POA: Diagnosis not present

## 2015-10-11 NOTE — Progress Notes (Signed)
80 year old male who presents today for further follow-up of his prostate cancer. The patient had Gleason 8 prostate cancer and was diagnosed in 2011. This was then treated with 3 years of hormone therapy with concomitant external beam duration therapy. The patient was last seen 1 year prior and his PSA was 0.2. Since the patient was last seen he denies any progression in his voiding symptoms. He denies any dysuria or gross hematuria. He has not had a fever or UTI. The patient has not had a PSA prior.  Current Outpatient Prescriptions on File Prior to Visit  Medication Sig Dispense Refill  . esomeprazole (NEXIUM) 10 MG packet Take 20 mg by mouth daily before breakfast.    . ferrous fumarate (HEMOCYTE - 106 MG FE) 325 (106 FE) MG TABS tablet Take 1 tablet by mouth daily. Reported on 06/24/2015    . hydrochlorothiazide (HYDRODIURIL) 25 MG tablet Take 1 tablet (25 mg total) by mouth daily. 30 tablet 6  . HYDROcodone-acetaminophen (NORCO) 10-325 MG tablet Limit 1 tablet by mouth 2-4 times per day for breakthrough pain while taking MS Contin if tolerated 120 tablet 0  . metoprolol tartrate (LOPRESSOR) 25 MG tablet Take 0.5 tablets (12.5 mg total) by mouth daily. (Patient taking differently: Take 25 mg by mouth daily as needed. ) 30 tablet 6  . morphine (MS CONTIN) 30 MG 12 hr tablet Limit one tab by mouth every  8 - 12  hours if tolerated 90 tablet 0  . Nutritional Supplements (ESTROVEN NIGHTTIME) TABS Take by mouth daily. Reported on 10/11/2015     Current Facility-Administered Medications on File Prior to Visit  Medication Dose Route Frequency Jubilee Vivero Last Rate Last Dose  . bupivacaine (PF) (MARCAINE) 0.25 % injection 30 mL  30 mL Other Once Mohammed Kindle, MD      . ciprofloxacin (CIPRO) IVPB 400 mg  400 mg Intravenous Once Mohammed Kindle, MD      . fentaNYL (SUBLIMAZE) injection 100 mcg  100 mcg Intravenous Once Mohammed Kindle, MD      . lactated ringers infusion 1,000 mL  1,000 mL Intravenous Continuous  Mohammed Kindle, MD      . lidocaine (PF) (XYLOCAINE) 1 % injection 10 mL  10 mL Subcutaneous Once Mohammed Kindle, MD      . midazolam (VERSED) 5 MG/5ML injection 5 mg  5 mg Intravenous Once Mohammed Kindle, MD      . orphenadrine (NORFLEX) injection 60 mg  60 mg Intramuscular Once Mohammed Kindle, MD      . triamcinolone acetonide (KENALOG-40) injection 40 mg  40 mg Other Once Mohammed Kindle, MD       Past Medical History  Diagnosis Date  . Other chronic pain   . Unspecified essential hypertension   . Unspecified visual loss   . Reflux esophagitis   . Undiagnosed cardiac murmurs   . Prostate cancer ([place])   . Anemia   . Stroke [facility])    Past Surgical History  Procedure Laterality Date  . Back surgery    . Colonoscopy      PE: NAD DRE: Patient's prostatic fossa was atrophied and his prostate is difficult to palpate as result. There are no appreciable nodules or masses.  Impression/ Recommendation: The patient had high-risk prostate cancer treated with external radiation therapy and at least 3 years of admission to permission therapy. This was treated in 2011 in [place] with [place]. He has had no progression of his voiding symptoms. His rectal exam is reassuring. Our plan  at this point is to have the patient obtain a PSA today and then we'll also plan to follow-up in one year with a PSA prior.

## 2015-10-12 LAB — PSA: Prostate Specific Ag, Serum: 0.4 ng/mL (ref 0.0–4.0)

## 2015-10-19 ENCOUNTER — Telehealth: Payer: Self-pay

## 2015-10-19 NOTE — Telephone Encounter (Signed)
-----   Message from Ardis Hughs, MD sent at 10/17/2015 11:51 AM EDT ----- Please inform patient that his PSA is stable, we will plan to check it again in one year.

## 2015-10-19 NOTE — Telephone Encounter (Signed)
Spoke with pt wife in reference to PSA results. Wife voiced understanding.  

## 2015-10-28 ENCOUNTER — Ambulatory Visit: Payer: Medicare Other | Attending: Pain Medicine | Admitting: Pain Medicine

## 2015-10-28 ENCOUNTER — Encounter: Payer: Self-pay | Admitting: Pain Medicine

## 2015-10-28 ENCOUNTER — Telehealth: Payer: Self-pay | Admitting: Cardiology

## 2015-10-28 VITALS — BP 122/50 | HR 78 | Temp 98.2°F | Resp 18 | Ht 68.0 in | Wt 160.0 lb

## 2015-10-28 DIAGNOSIS — M19019 Primary osteoarthritis, unspecified shoulder: Secondary | ICD-10-CM | POA: Diagnosis not present

## 2015-10-28 DIAGNOSIS — M47816 Spondylosis without myelopathy or radiculopathy, lumbar region: Secondary | ICD-10-CM | POA: Diagnosis not present

## 2015-10-28 DIAGNOSIS — M533 Sacrococcygeal disorders, not elsewhere classified: Secondary | ICD-10-CM

## 2015-10-28 DIAGNOSIS — M5134 Other intervertebral disc degeneration, thoracic region: Secondary | ICD-10-CM

## 2015-10-28 DIAGNOSIS — M5126 Other intervertebral disc displacement, lumbar region: Secondary | ICD-10-CM | POA: Insufficient documentation

## 2015-10-28 DIAGNOSIS — M170 Bilateral primary osteoarthritis of knee: Secondary | ICD-10-CM | POA: Diagnosis not present

## 2015-10-28 DIAGNOSIS — M51369 Other intervertebral disc degeneration, lumbar region without mention of lumbar back pain or lower extremity pain: Secondary | ICD-10-CM

## 2015-10-28 DIAGNOSIS — R600 Localized edema: Secondary | ICD-10-CM | POA: Diagnosis not present

## 2015-10-28 DIAGNOSIS — M4806 Spinal stenosis, lumbar region: Secondary | ICD-10-CM | POA: Insufficient documentation

## 2015-10-28 DIAGNOSIS — Z7901 Long term (current) use of anticoagulants: Secondary | ICD-10-CM | POA: Diagnosis not present

## 2015-10-28 DIAGNOSIS — M2578 Osteophyte, vertebrae: Secondary | ICD-10-CM | POA: Diagnosis not present

## 2015-10-28 DIAGNOSIS — M5030 Other cervical disc degeneration, unspecified cervical region: Secondary | ICD-10-CM | POA: Diagnosis not present

## 2015-10-28 DIAGNOSIS — M546 Pain in thoracic spine: Secondary | ICD-10-CM | POA: Diagnosis present

## 2015-10-28 DIAGNOSIS — M542 Cervicalgia: Secondary | ICD-10-CM | POA: Diagnosis present

## 2015-10-28 DIAGNOSIS — M5136 Other intervertebral disc degeneration, lumbar region: Secondary | ICD-10-CM

## 2015-10-28 DIAGNOSIS — M47817 Spondylosis without myelopathy or radiculopathy, lumbosacral region: Secondary | ICD-10-CM

## 2015-10-28 MED ORDER — MORPHINE SULFATE ER 30 MG PO TBCR: EXTENDED_RELEASE_TABLET | ORAL | 0 refills | Status: DC

## 2015-10-28 NOTE — Progress Notes (Signed)
Safety precautions to be maintained throughout the outpatient stay will include: orient to surroundings, keep bed in low position, maintain call bell within reach at all times, provide assistance with transfer out of bed and ambulation.  

## 2015-10-28 NOTE — Telephone Encounter (Signed)
Left voicemail message for patient to call back.

## 2015-10-28 NOTE — Telephone Encounter (Signed)
Patients wife states that he has some swelling and discoloration to his legs. He is taking his HCTZ 25 mg once daily but did miss a dose yesterday. She states that they saw Dr. Primus Bravo and recommended for him to be seen today. Let her know that she should check with his primary care physician or maybe have it evaluated at an urgent care facility. She verbalized understanding of our conversation, agreement with plan of care, and had no further questions at this time.

## 2015-10-28 NOTE — Progress Notes (Signed)
Patient is a 80 year old gentleman who returns to pain management for further evaluation and treatment of pain involving the neck entire back upper and lower extremity regions with pain involving the shoulders and knees of significant degree in addition to lumbar lower extremity pain. At present time we discussed patient's condition including lower extremity swelling. Patient is a significant lower extremity swelling involving the right lower extremity especially. Revise patient. We will consider Doppler flow ultrasound studies at this time but would prefer that patient follow-up with cardiologist and primary care physician today or by tomorrow for further assessment of his general condition. There has been concern regarding patient's cardiac condition and patient is undergone recent cardiac evaluation. We have discussed diuretic therapy as well as anticoagulant therapy and other treatment which may be appropriate for patient and we will continue present medications including MS Contin with hydrocodone acetaminophen for breakthrough pain and have patient follow up with his primary care physician and cardiologist immediately as well as consider vascular evaluation. All agreed to suggested treatment plan     Physical examination  The patient was with limited range of motion of the cervical spine. There was tenderness to palpation over the splenius capitis and occipitalis region a moderate degree. Palpation of the acromioclavicular and glenohumeral joint regions reproduce severe pain and patient was with significantly limited range of motion of the shoulders. The patient appeared to be with bilaterally equal grip strength with Tinel and Phalen's maneuver reproducing minimal discomfort. There was unremarkable Spurling's maneuver. Palpation over the thoracic region was with moderate tenderness to palpation with crepitus of the thoracic region noted. Palpation over the lumbar paraspinal musculatures and  lumbar facet region was attends to palpation of moderate degree with lateral bending rotation extension and palpation over the lumbar facets all reproducing moderate discomfort. Palpation over the region of the PSIS and PII S region reproduced moderate discomfort and straight leg raising was tolerates approximately 20 without a definite increase of pain with dorsiflexion noted. The knees were tenderness to palpation with negative anterior and posterior drawer signs. There was significant crepitus of the knees with increased pain with range of motion maneuvers of the knees. There is swelling with 2+ lower extremity edema EHL strength decreased there appeared to be negative Homans without increased warmth or erythema of the lower extremity is noted on today's evaluation. No definite sensory deficit of dermatomal distribution detected. Abdomen nontender with no costovertebral tenderness noted.     ASSESSMENT   Degenerative joint disease of knees  Degenerative joint disease of shoulder  Degenerative disc disease of lumbar spine L2-3 L3-4 L4-5 L5-S1 degenerative changes with lumbar spondylosis with multilevel degenerative disc disease lumbar spine, multifactorial spinal and lateral recess stenosis and foraminal stenosis L3-4 diffuse disc bulging, osteophytic multifactorial spinal stenosis and lateral recess stenosis and foraminal stenosis spinal stenosis L4-L5 and similar changes L5-S1  Sacroiliac joint dysfunction  Degenerative disc disease cervical spine  Cervical facet syndrome  Lower extremity edema      PLAN   Continue present medication hydrocodone acetaminophen and MS Contin(morphine sulfate extended release)  F/U PCP Dr.Hande  today for evaluation of  lower extremity swelling as discussed and for evaluation of BP and general medical  Condition  See your  cardiologist today for evaluation of lower extremity swelling you may need to undergo Doppler flow ultrasound studies of the  lower extremity as we discussed in addition to diuretic therapy and other treatment of the significant lower extremity swelling  F/U surgical evaluation.  May consider pending follow-up evaluations  Wear brace on knees as previously discussed  F/U with cardiologist as planned  F/U vascular evaluation  F/U with urologist as planned  F/U neurological evaluation. May consider PNCV/EMG studies and other studies pending follow-up evaluations  May consider radiofrequency rhizolysis or intraspinal procedures pending response to present treatment and F/U evaluation We will avoid such treatment at this time  Patient to call [facility] should patient have concerns prior to scheduled return appointment

## 2015-10-28 NOTE — Patient Instructions (Signed)
PLAN   Continue present medication hydrocodone acetaminophen and MS Contin(morphine sulfate extended release)  F/U PCP Dr.Hande  today for evaluation of  lower extremity swelling as discussed and for evaluation of BP and general medical  Condition  See your  cardiologist today for evaluation of lower extremity swelling you may need to undergo Doppler flow ultrasound studies of the lower extremity as we discussed in addition to diuretic therapy and other treatment of the significant lower extremity swelling  F/U surgical evaluation. May consider pending follow-up evaluations  Wear brace on knees as previously discussed  F/U with cardiologist as planned  F/U with urologist as planned  F/U neurological evaluation. May consider PNCV/EMG studies and other studies pending follow-up evaluations  May consider radiofrequency rhizolysis or intraspinal procedures pending response to present treatment and F/U evaluation We will avoid such treatment at this time  Patient to call [facility] should patient have concerns prior to scheduled return appointment

## 2015-10-28 NOTE — Telephone Encounter (Signed)
Patient returning call from [place] .  Please call back .

## 2015-10-28 NOTE — Telephone Encounter (Signed)
Pt c/o swelling: STAT is pt has developed SOB within 24 hours  1. How long have you been experiencing swelling?   About a week   2. Where is the swelling located? BLE up to knee Right leg is worse  Also has discoloration is redish brownish in color   3.  Are you currently taking a "fluid pill"?  Yes  hasn't taken since Tuesday     4.  Are you currently SOB?   Sometimes yes but hasnt noticed increase with swelling patient gets sob on exertion   5.  Have you traveled recently?  No sits at home with feet up but patient says he has been walking

## 2015-10-29 ENCOUNTER — Other Ambulatory Visit: Payer: Self-pay

## 2015-10-29 ENCOUNTER — Emergency Department: Payer: Medicare Other

## 2015-10-29 ENCOUNTER — Emergency Department
Admission: EM | Admit: 2015-10-29 | Discharge: 2015-10-30 | Disposition: A | Discharge status: Home / Self Care | Payer: Medicare Other | Attending: Emergency Medicine | Admitting: Emergency Medicine

## 2015-10-29 ENCOUNTER — Encounter: Payer: Self-pay | Admitting: Emergency Medicine

## 2015-10-29 DIAGNOSIS — R911 Solitary pulmonary nodule: Secondary | ICD-10-CM | POA: Insufficient documentation

## 2015-10-29 DIAGNOSIS — R0602 Shortness of breath: Secondary | ICD-10-CM | POA: Diagnosis not present

## 2015-10-29 DIAGNOSIS — F17210 Nicotine dependence, cigarettes, uncomplicated: Secondary | ICD-10-CM | POA: Diagnosis not present

## 2015-10-29 DIAGNOSIS — Z8546 Personal history of malignant neoplasm of prostate: Secondary | ICD-10-CM | POA: Insufficient documentation

## 2015-10-29 DIAGNOSIS — R600 Localized edema: Secondary | ICD-10-CM

## 2015-10-29 DIAGNOSIS — I10 Essential (primary) hypertension: Secondary | ICD-10-CM | POA: Insufficient documentation

## 2015-10-29 DIAGNOSIS — R2243 Localized swelling, mass and lump, lower limb, bilateral: Secondary | ICD-10-CM | POA: Diagnosis present

## 2015-10-29 DIAGNOSIS — R9389 Abnormal findings on diagnostic imaging of other specified body structures: Secondary | ICD-10-CM

## 2015-10-29 LAB — BASIC METABOLIC PANEL
ANION GAP: 6 (ref 5–15)
BUN: 21 mg/dL — ABNORMAL HIGH (ref 6–20)
CALCIUM: 9.7 mg/dL (ref 8.9–10.3)
CHLORIDE: 102 mmol/L (ref 101–111)
CO2: 28 mmol/L (ref 22–32)
Creatinine, Ser: 1.64 mg/dL — ABNORMAL HIGH (ref 0.61–1.24)
GFR calc non Af Amer: 38 mL/min — ABNORMAL LOW (ref >60)
GFR, EST AFRICAN AMERICAN: 45 mL/min — AB (ref >60)
Glucose, Bld: 129 mg/dL — ABNORMAL HIGH (ref 65–99)
POTASSIUM: 4.4 mmol/L (ref 3.5–5.1)
Sodium: 136 mmol/L (ref 135–145)

## 2015-10-29 LAB — CBC
HCT: 38.6 % — ABNORMAL LOW (ref 40.0–52.0)
HEMOGLOBIN: 12.6 g/dL — AB (ref 13.0–18.0)
MCH: 21.5 pg — AB (ref 26.0–34.0)
MCHC: 32.6 g/dL (ref 32.0–36.0)
MCV: 66 fL — ABNORMAL LOW (ref 80.0–100.0)
Platelets: 261 10*3/uL (ref 150–440)
RBC: 5.84 MIL/uL (ref 4.40–5.90)
RDW: 20.5 % — ABNORMAL HIGH (ref 11.5–14.5)
WBC: 10.1 10*3/uL (ref 3.8–10.6)

## 2015-10-29 LAB — BRAIN NATRIURETIC PEPTIDE: B NATRIURETIC PEPTIDE 5: 768 pg/mL — AB (ref 0.0–100.0)

## 2015-10-29 LAB — TROPONIN I

## 2015-10-29 MED ORDER — MORPHINE SULFATE ER 30 MG PO TBCR
30.0000 mg | EXTENDED_RELEASE_TABLET | Freq: Two times a day (BID) | ORAL | Status: DC
Start: 2015-10-29 — End: 2015-10-30

## 2015-10-29 NOTE — ED Notes (Signed)
Patient sent to the ED by [facility] for evaluation of leg swelling. Patient states that he chronically has swelling in his legs, right is usually greater than left. He saw his pain doctor recently who was concerned about the swelling. Patient states that his legs are "sore" and that the pain is worse when he is walking.

## 2015-10-29 NOTE — ED Provider Notes (Signed)
[facility] Emergency Department Provider Note  ____________________________________________   I have reviewed the triage vital signs and the nursing notes.   HISTORY  Chief Complaint Leg Swelling    HPI Nathan Kramer is a 80 y.o. male who is a very poor historian. He has a chronic history of lower extremity swelling and he takes Lasix for this. He apparently takes it every day although he told me he takes a "as needed" his wife tells me he takes it every day. Usually he goes home. His legs up and his swelling gets better. He was at his chronic pain doctor the day before yesterday he states and it was noted that there was increased swelling and he was told to come here to rule out a blood clot. Patient does not have any chest pain. He has chronic shortness of breath but he does not have any change in his chronic shortness of breath. He denies any fever or chills. He denies any significant Pain although he does state that he has sometimes noted Pain behind the right. Patient is a very poor historian. He is at his baseline according to his wife who helps him.      Past Medical History:  Diagnosis Date  . Anemia   . Other chronic pain   . Prostate cancer ([place])   . Reflux esophagitis   . Stroke ([place])   . Undiagnosed cardiac murmurs   . Unspecified essential hypertension   . Unspecified visual loss     Patient Active Problem List   Diagnosis Date Noted  . Allergic state 07/05/2015  . Current tobacco use 07/05/2015  . Lumbar canal stenosis 07/05/2015  . Discharge from nose 02/15/2015  . Nutritional anemia 10/14/2014  . DJD (degenerative joint disease) of knee 09/16/2014  . DDD (degenerative disc disease), lumbar 08/31/2014  . DDD (degenerative disc disease), thoracic 08/31/2014  . DDD (degenerative disc disease), cervical 08/31/2014  . Lumbosacral facet joint syndrome 08/31/2014  . Spinal stenosis, lumbar region, with neurogenic claudication  08/31/2014  . Sacroiliac joint dysfunction 08/31/2014  . HTN (hypertension) 08/20/2014  . Cardiac murmur 06/11/2014  . Chronic LBP 06/11/2014  . Gastro-esophageal reflux disease without esophagitis 06/11/2014  . Decreased vision in both eyes 06/11/2014  . H/O malignant neoplasm of prostate 06/11/2014  . Aortic stenosis, moderate 03/11/2013  . Sinus tachycardia ([place]) 03/11/2013    Past Surgical History:  Procedure Laterality Date  . back surgery    . COLONOSCOPY      Current Outpatient Rx  . Order #: [id number] Class: Historical Med  . Order #: [id number] Class: Historical Med  . Order #: [id number] Class: Historical Med  . Order #: [id number] Class: Normal  . Order #: [id number] Class: Print  . Order #: [id number] Class: Normal  . Order #: [id number] Class: Print  . Order #: [id number] Class: Historical Med    Allergies Amoxicillin and Reglan [metoclopramide]  Family History  Problem Relation Age of Onset  . Diabetes Mother   . Hypertension Mother   . Hypertension Father   . Heart disease Father   . Cancer Sister   . Heart disease Brother     Social History Social History  Substance Use Topics  . Smoking status: Current Some Day Smoker    Packs/day: 0.10    Years: 50.00    Types: Cigarettes  . Smokeless tobacco: Not on file  . Alcohol use No    Review of Systems Constitutional: No fever/chills Eyes: No visual changes. ENT: No sore throat. No stiff neck  no neck pain Cardiovascular: Denies chest pain. Respiratory: DeniesAny change in his chronic shortness of breath. Gastrointestinal:   no vomiting.  No diarrhea.  No constipation. Genitourinary: Negative for dysuria. Musculoskeletal: Positive lower extremity swelling Skin: Negative for rash. Neurological: Negative for severe headaches, focal weakness or numbness. 10-point ROS otherwise negative.  ____________________________________________   PHYSICAL EXAM:  VITAL SIGNS: ED Triage Vitals  Enc Vitals Group     BP  10/29/15 1639 (!) 121/44     Pulse Rate 10/29/15 1639 61     Resp 10/29/15 1830 19     Temp 10/29/15 1639 97.6 F (36.4 C)     Temp Source 10/29/15 1639 Oral     SpO2 10/29/15 1639 95 %     Weight 10/29/15 1639 160 lb (72.6 kg)     Height 10/29/15 1639 5\' 8"  (1.727 m)     Head Circumference --      Peak Flow --      Pain Score 10/29/15 1649 8     Pain Loc --      Pain Edu? --      Excl. in [place]? --     Constitutional: Alert and oriented. Well appearing and in no acute distress. Eyes: Conjunctivae are normal. PERRL. EOMI. Head: Atraumatic. Nose: No congestion/rhinnorhea. Mouth/Throat: Mucous membranes are moist.  Oropharynx non-erythematous. Neck: No stridor.   Nontender with no meningismus Cardiovascular: Normal rate, regular rhythm. Grossly normal heart sounds.  Good peripheral circulation. Respiratory: Normal respiratory effort.  No retractions. Occasional possible slight rale noted in his lung bases but no wheezes or rhonchi Abdominal: Soft and nontender. No distention. No guarding no rebound Back:  There is no focal tenderness or step off.  there is no midline tenderness there are no lesions noted. there is no CVA tenderness Musculoskeletal: No lower extremity tenderness, no upper extremity tenderness. No joint effusions, patient does not have any calf tenderness at this time, his right leg is slightly more swollen than the left knee has 2+ pitting edema bilaterally. Strong distal pulses. Neurologic:  Normal speech and language. No gross focal neurologic deficits are appreciated.  Skin:  Skin is warm, dry and intact. No rash noted. Psychiatric: Mood and affect are normal. Speech and behavior are normal.  ____________________________________________   LABS (all labs ordered are listed, but only abnormal results are displayed)  Labs Reviewed  BASIC METABOLIC PANEL - Abnormal; Notable for the following:       Result Value   Glucose, Bld 129 (*)    BUN 21 (*)    Creatinine,  Ser 1.64 (*)    GFR calc non Af Amer 38 (*)    GFR calc Af Amer 45 (*)    All other components within normal limits  CBC - Abnormal; Notable for the following:    Hemoglobin 12.6 (*)    HCT 38.6 (*)    MCV 66.0 (*)    MCH 21.5 (*)    RDW 20.5 (*)    All other components within normal limits  BRAIN NATRIURETIC PEPTIDE - Abnormal; Notable for the following:    B Natriuretic Peptide 768.0 (*)    All other components within normal limits  TROPONIN I   ____________________________________________  EKG  I personally interpreted any EKGs ordered by me or triage  ____________________________________________  RADIOLOGY  I reviewed any imaging ordered by me or triage that were performed during my shift and, if possible, patient and/or family made aware of any abnormal findings. ____________________________________________   PROCEDURES  Procedure(s) performed: None  Procedures  Critical Care performed: None  ____________________________________________   INITIAL IMPRESSION / ASSESSMENT AND PLAN / ED COURSE  Pertinent labs & imaging results that were available during my care of the patient were reviewed by me and considered in my medical decision making (see chart for details).  Patient presents with leg swelling which she describes as chronic for which seem to his pain doctor to seem somewhat more acute than baseline. He apparently also had some calf pain although is not having significant Pain at this time. He sent here for rule out DVT. We will do so. Chest x-ray is a knowledge. Patient does not have clinical symptoms of a pneumonia, nor his atypical symptoms of a PE. We will obtain Doppler ultrasound. There has been a delay in obtaining the ultrasound however because of other more acute patients.  Patient is an see because he has not had his daily morphine does. We have offered to him here but he does not want to "pay for it" and therefore he is waiting for his son to bring him  his home morphine dose and in the meantime he is anxious.     Clinical Course   ____________________________________________   FINAL CLINICAL IMPRESSION(S) / ED DIAGNOSES  Final diagnoses:  None      This chart was dictated using voice recognition software.  Despite best efforts to proofread,  errors can occur which can change meaning.      Schuyler Amor, MD 10/29/15 2127

## 2015-10-29 NOTE — ED Notes (Signed)
Pt and family updated on ultrasound progress. Pt states "i don't have all day to wait." pt's spouse understanding regarding delay and encourages pt to rest.

## 2015-10-29 NOTE — ED Notes (Signed)
Pt up to commode for bowel movement.

## 2015-10-29 NOTE — ED Notes (Signed)
Delay for ultrasound again discussed with pt and family who now verbalize understanding.

## 2015-10-29 NOTE — ED Notes (Signed)
Pt states takes ms contin 30mg  po every 8 hours. Pt states last dose at 5am. Pt requesting morphine at this time. Dr. Burlene Arnt notified.

## 2015-10-29 NOTE — ED Triage Notes (Addendum)
C/o bilateral leg swelling. Denies chest pain. Denies fevers. Wife reports he takes lasix and has had left swelling before and they don't know why. Has had [facility]

## 2015-10-29 NOTE — ED Notes (Signed)
Patient transported to Ultrasound 

## 2015-10-29 NOTE — ED Notes (Addendum)
Patient transported to ct

## 2015-10-29 NOTE — ED Notes (Signed)
Pt returned from ultrasound

## 2015-10-29 NOTE — ED Notes (Signed)
Continue to await ms contin from pharmacy. Pt states he will take his own ms contin. md notified and ok if pt doses self from own meds. Explanation of delay for ultrasound again provided to pt and spouse who continue to voice concern over wait time.

## 2015-10-29 NOTE — ED Notes (Signed)
Via wheelchair from [facility], right calf pain x"a few days" , no acute distress

## 2015-10-29 NOTE — ED Notes (Signed)
Pt eating a cheeseburger and drinking coffee.

## 2015-10-30 NOTE — ED Provider Notes (Signed)
I assumed care of the patient from Dr. Jiles Harold 11:00 PM with ultrasound of the lower extremity venous study pending which revealed: CLINICAL DATA:  Right lower extremity swelling. Right calf pain and swelling for several days. EXAM: Right LOWER EXTREMITY VENOUS DOPPLER ULTRASOUND TECHNIQUE: Gray-scale sonography with graded compression, as well as color Doppler and duplex ultrasound were performed to evaluate the lower extremity deep venous systems from the level of the common femoral vein and including the common femoral, femoral, profunda femoral, popliteal and calf veins including the posterior tibial, peroneal and gastrocnemius veins when visible. The superficial great saphenous vein was also interrogated. Spectral Doppler was utilized to evaluate flow at rest and with distal augmentation maneuvers in the common femoral, femoral and popliteal veins. COMPARISON:  None. FINDINGS: Contralateral Common Femoral Vein: Respiratory phasicity is normal and symmetric with the symptomatic side. No evidence of thrombus. Normal compressibility. Common Femoral Vein: No evidence of thrombus. Normal compressibility, respiratory phasicity and response to augmentation. Saphenofemoral Junction: No evidence of thrombus. Normal compressibility and flow on color Doppler imaging. Profunda Femoral Vein: No evidence of thrombus. Normal compressibility and flow on color Doppler imaging. Femoral Vein: No evidence of thrombus. Normal compressibility, respiratory phasicity and response to augmentation. Popliteal Vein: No evidence of thrombus. Normal compressibility, respiratory phasicity and response to augmentation. Calf Veins: No evidence of thrombus. Normal compressibility and flow on color Doppler imaging. Superficial Great Saphenous Vein: No evidence of thrombus. Normal compressibility and flow on color Doppler imaging. Venous Reflux:  None. Other Findings:  None. IMPRESSION: No evidence of deep  venous thrombosis. Electronically Signed   By: San Morelle M.D.   On: 10/29/2015 23:40I I also informed the patient of the findings on the CT scan which revealed pulmonary nodules and recommended follow-up with his primary care provider   Gregor Hams, MD 10/30/15 [phone]

## 2015-11-23 ENCOUNTER — Ambulatory Visit (INDEPENDENT_AMBULATORY_CARE_PROVIDER_SITE_OTHER): Payer: Medicare Other | Admitting: Cardiology

## 2015-11-23 ENCOUNTER — Encounter: Payer: Self-pay | Admitting: Cardiology

## 2015-11-23 VITALS — BP 102/48 | HR 81 | Ht 68.0 in | Wt 162.2 lb

## 2015-11-23 DIAGNOSIS — R011 Cardiac murmur, unspecified: Secondary | ICD-10-CM | POA: Diagnosis not present

## 2015-11-23 DIAGNOSIS — I5033 Acute on chronic diastolic (congestive) heart failure: Secondary | ICD-10-CM

## 2015-11-23 DIAGNOSIS — I350 Nonrheumatic aortic (valve) stenosis: Secondary | ICD-10-CM

## 2015-11-23 DIAGNOSIS — I10 Essential (primary) hypertension: Secondary | ICD-10-CM | POA: Diagnosis not present

## 2015-11-23 DIAGNOSIS — I351 Nonrheumatic aortic (valve) insufficiency: Secondary | ICD-10-CM | POA: Diagnosis not present

## 2015-11-23 MED ORDER — FUROSEMIDE 20 MG PO TABS: 20.0000 mg | ORAL_TABLET | Freq: Every day | ORAL | 3 refills | Status: DC

## 2015-11-23 NOTE — Patient Instructions (Signed)
Medication Instructions:  Your physician has recommended you make the following change in your medication:  1. STOP Taking metoprolol 2. STOP taking Hydrochlorothiazide HCTZ 3. Start taking Lasix 20 mg Once Daily   Labwork: Your physician recommends that you return for lab work in: 1 week for repeat labs. BMP  Follow-Up: Your physician recommends that you schedule a follow-up appointment in: 1 month with Dr. Yvone Neu.  It was a pleasure seeing you today here in the office. Please do not hesitate to give [place] a call back if you have any further questions. [place], BSN

## 2015-11-23 NOTE — Progress Notes (Signed)
Cardiology Office Note   Date:  11/23/2015   ID:  Nathan Kramer, DOB [birth year], MRN [id number]  Referring Doctor:  Tracie Harrier, MD   Cardiologist:   Wende Bushy, MD   Reason for consultation:  Chief Complaint  Patient presents with  . Other    2 month f/u c/o swelling legs and fatigue with metoprolol/hctz. Meds reviewed verbally with pt.      History of Present Illness: Nathan Kramer is a 80 y.o. male who presents for ffup for AS   patient continues to have shortness of breath. Patient is not very forthcoming with his symptoms. According to the wife, he stops multiple times with walking due to many reasons. One is shortness of breath and another reason sometimes is back pain and leg pain brought on by significant spine /back problems.  Since last visit, he has noticed increased swelling of his lower extremities. He ended up in the ER because of this. A venous ultrasound was done which did not show any DVT. He had no worsening shortness of breath at that time. No chest pain.  Patient denies chest pain and loss of consciousness. No fever, cough, colds, abdominal pain. No orthopnea and no PND.   ROS:  Please see the history of present illness. Aside from mentioned under HPI, all other systems are reviewed and negative.     Past Medical History:  Diagnosis Date  . Anemia   . Other chronic pain   . Prostate cancer ([place])   . Reflux esophagitis   . Stroke ([place])   . Undiagnosed cardiac murmurs   . Unspecified essential hypertension   . Unspecified visual loss     Past Surgical History:  Procedure Laterality Date  . back surgery    . COLONOSCOPY       reports that he has been smoking Cigarettes.  He has a 5.00 pack-year smoking history. He has never used smokeless tobacco. He reports that he does not drink alcohol or use drugs.   family history includes Cancer in his sister; Diabetes in his mother; Heart disease in his brother and father; Hypertension in  his father and mother.   Current Outpatient Prescriptions  Medication Sig Dispense Refill  . diphenhydrAMINE (BENADRYL) 25 MG tablet Take 25 mg by mouth as needed.     [name] esomeprazole (NEXIUM) 10 MG packet Take 20 mg by mouth daily before breakfast.    . ferrous fumarate (HEMOCYTE - 106 MG FE) 325 (106 FE) MG TABS tablet Take 1 tablet by mouth daily. Reported on 06/24/2015    . HYDROcodone-acetaminophen (NORCO) 10-325 MG tablet Limit 1 tablet by mouth 2-4 times per day for breakthrough pain while taking MS Contin if tolerated 120 tablet 0  . morphine (MS CONTIN) 30 MG 12 hr tablet Limit one tab by mouth every  8 - 12  hours if tolerated 90 tablet 0  . Nutritional Supplements (ESTROVEN NIGHTTIME) TABS Take by mouth as needed. Reported on 10/11/2015    . furosemide (LASIX) 20 MG tablet Take 1 tablet (20 mg total) by mouth daily. 30 tablet 3   Current Facility-Administered Medications  Medication Dose Route Frequency Provider Last Rate Last Dose  . bupivacaine (PF) (MARCAINE) 0.25 % injection 30 mL  30 mL Other Once Mohammed Kindle, MD      . ciprofloxacin (CIPRO) IVPB 400 mg  400 mg Intravenous Once Mohammed Kindle, MD      . fentaNYL (SUBLIMAZE) injection 100 mcg  100 mcg Intravenous Once  Mohammed Kindle, MD      . lactated ringers infusion 1,000 mL  1,000 mL Intravenous Continuous Mohammed Kindle, MD      . lidocaine (PF) (XYLOCAINE) 1 % injection 10 mL  10 mL Subcutaneous Once Mohammed Kindle, MD      . midazolam (VERSED) 5 MG/5ML injection 5 mg  5 mg Intravenous Once Mohammed Kindle, MD      . orphenadrine (NORFLEX) injection 60 mg  60 mg Intramuscular Once Mohammed Kindle, MD      . triamcinolone acetonide (KENALOG-40) injection 40 mg  40 mg Other Once Mohammed Kindle, MD        Allergies: Amoxicillin and Reglan [metoclopramide]    PHYSICAL EXAM: VS:  BP (!) 102/48 (BP Location: Left Arm, Patient Position: Sitting, Cuff Size: Normal)   Pulse 81   Ht 5\' 8"  (1.727 m)   Wt 162 lb 4 oz (73.6 kg)   BMI  24.67 kg/m  , Body mass index is 24.67 kg/m. Wt Readings from Last 3 Encounters:  11/23/15 162 lb 4 oz (73.6 kg)  10/29/15 160 lb (72.6 kg)  10/28/15 160 lb (72.6 kg)    GENERAL:  well developed, well nourished, not in acute distress HEENT: normocephalic, pink conjunctivae, anicteric sclerae, no xanthelasma, normal dentition, oropharynx clear NECK:  no neck vein engorgement, JVP normal, no hepatojugular reflux, carotid upstroke brisk and symmetric, no bruit, no thyromegaly, no lymphadenopathy LUNGS:  good respiratory effort, clear to auscultation bilaterally CV:  PMI not displaced, no thrills, no lifts, S1 and S2 within normal limits, no palpable S3 or S4, no rubs, no gallops, 3/6 systolic ejection murmur consistent with aortic stenosis, nonradiating, no delay in carotid upstroke ABD:  Soft, nontender, nondistended, normoactive bowel sounds, no abdominal aortic bruit, no hepatomegaly, no splenomegaly MS: nontender back, no kyphosis, no scoliosis, no joint deformities EXT:  2+ DP/PT pulses,  +2 edema, no varicosities, no cyanosis, no clubbing SKIN: warm, nondiaphoretic, normal turgor, no ulcers NEUROPSYCH: alert, oriented to person, place, and time, sensory/motor grossly intact, normal mood, appropriate affect  Recent Labs: 10/29/2015: B Natriuretic Peptide 768.0; BUN 21; Creatinine, Ser 1.64; Hemoglobin 12.6; Platelets 261; Potassium 4.4; Sodium 136   Lipid Panel No results found for: CHOL, TRIG, HDL, CHOLHDL, VLDL, LDLCALC, LDLDIRECT   Other studies Reviewed:  EKG:  The ekg from 08/24/2015 was personally reviewed by me and it revealed sinus rhythm, 81 BPM. T wave inversions. Poor R-wave progression.  Additional studies/ records that were reviewed personally reviewed by me today include:  Echo 09/07/2015: Left ventricle: The cavity size was normal. There was mild  concentric hypertrophy. Systolic function was normal. The  estimated ejection fraction was in the range of 55% to 60%.  Wall  motion was normal; there were no regional wall motion  abnormalities. Features are consistent with a pseudonormal left  ventricular filling pattern, with concomitant abnormal relaxation  and increased filling pressure (grade 2 diastolic dysfunction). - Aortic valve: There was moderate to severe stenosis. There was  moderate regurgitation. Mean gradient (S): 25 mm Hg. Valve area  (VTI): 0.84 cm^2. Valve area (Vmax): 0.81 cm^2. - Aorta: Ascending aortic diameter: 37 mm (S). - Mitral valve: Calcified annulus. Mildly thickened, moderately  calcified leaflets . The findings are consistent with mild  stenosis. Valve area by continuity equation (using LVOT flow):  1.84 cm^2. - Left atrium: The atrium was mildly dilated. - Pulmonary arteries: Systolic pressure was moderately increased.  PA peak pressure: 55 mm Hg (S).  Aortic valve Value  Reference Aortic valve peak velocity, S 313 cm/s --------- Aortic valve mean velocity, S 237 cm/s --------- Aortic valve VTI, S 72.2 cm --------- Aortic mean gradient, S 25 mm Hg --------- Aortic peak gradient, S 39 mm Hg --------- VTI ratio, LVOT/AV 0.31 --------- Aortic valve area, VTI 0.84 cm^2 --------- Aortic valve area/bsa, VTI 0.45 cm^2/m^2 --------- Velocity ratio, peak, LVOT/AV 0.32 --------- Aortic valve area, peak velocity 0.81 cm^2 --------- Aortic valve area/bsa, peak 0.43 cm^2/m^2 --------- velocity Velocity ratio, mean, LVOT/AV 0.28 --------- Aortic valve area, mean velocity 0.71 cm^2 --------- Aortic valve area/bsa, mean 0.38 cm^2/m^2 --------- velocity Aortic regurg pressure  half-time 302 ms ---------  Nuclear stress is 10/04/2015: Pharmacological myocardial perfusion imaging study with no significant  ischemia GI uptake artifact noted Normal wall motion, EF estimated at 50% (ejection fraction likely reduce secondary to artifact around the inferior wall) No EKG changes concerning for ischemia at peak stress or in recovery. Nonspecific ST abnormality noted at rest Low risk scan  ASSESSMENT AND PLAN:  Aortic stenosis Aortic insufficiency Previous medical records reports mild to moderate aortic stenosis  most recent echocardiogram reveals moderate severe aortic stenosis and moderate aortic insufficiency.  Peak aortic velocity is less than 4 m/s. Mean aortic is 35mmHg, Stress testing did not reveal any evidence of ischemia. Findings discussed with patient. Patient not really leaning towards surgery down the line. He and wife are concerned that his quality of his life is limited mainly by multiple joint issues and back issues. This is mainly what stops him most from doing further physical activity and not necessarily shortness of breath.  Lower extremity swelling Likely congestive heart failure with preserved ejection fraction, acute on chronic Check BMP today. Recommend to discontinue HCTZ. Start Lasix 20 mg by mouth daily. Recheck BMP in 1 week to follow-up electrolytes. May need potassium supplementation.  Hypertension BP is well controlled. Continue monitoring BP. Will hold off on metoprolol for now and advised patient to monitor blood pressure closely.   Current medicines are reviewed at length with the patient today.  The patient does not have concerns regarding medicines.  Labs/ tests ordered today include:  Orders Placed This Encounter  Procedures  . Basic Metabolic Panel (BMET)  . Basic Metabolic Panel (BMET)    I had a lengthy and detailed discussion with the patient regarding diagnoses, prognosis, diagnostic options, treatment  options , and side effects of medications.   I counseled the patient on importance of lifestyle modification including heart healthy diet, regular physical activity .   Disposition:   FU with undersigned  In one month  Signed, Wende Bushy, MD  11/23/2015 1:02 PM    [place]

## 2015-11-24 LAB — BASIC METABOLIC PANEL
BUN / CREAT RATIO: 12 (ref 10–24)
BUN: 17 mg/dL (ref 8–27)
CO2: 21 mmol/L (ref 18–29)
CREATININE: 1.42 mg/dL — AB (ref 0.76–1.27)
Calcium: 10 mg/dL (ref 8.6–10.2)
Chloride: 99 mmol/L (ref 96–106)
GFR calc non Af Amer: 47 mL/min/{1.73_m2} — ABNORMAL LOW (ref >59)
GFR, EST AFRICAN AMERICAN: 54 mL/min/{1.73_m2} — AB (ref >59)
GLUCOSE: 89 mg/dL (ref 65–99)
Potassium: 5.8 mmol/L — ABNORMAL HIGH (ref 3.5–5.2)
Sodium: 139 mmol/L (ref 134–144)

## 2015-11-25 ENCOUNTER — Encounter: Payer: Self-pay | Admitting: Pain Medicine

## 2015-11-25 ENCOUNTER — Ambulatory Visit: Payer: Medicare Other | Attending: Pain Medicine | Admitting: Pain Medicine

## 2015-11-25 VITALS — BP 122/57 | HR 82 | Temp 98.1°F | Resp 16 | Ht 68.0 in | Wt 160.0 lb

## 2015-11-25 DIAGNOSIS — M5136 Other intervertebral disc degeneration, lumbar region: Secondary | ICD-10-CM

## 2015-11-25 DIAGNOSIS — M19019 Primary osteoarthritis, unspecified shoulder: Secondary | ICD-10-CM | POA: Diagnosis not present

## 2015-11-25 DIAGNOSIS — M5030 Other cervical disc degeneration, unspecified cervical region: Secondary | ICD-10-CM | POA: Insufficient documentation

## 2015-11-25 DIAGNOSIS — R600 Localized edema: Secondary | ICD-10-CM | POA: Diagnosis not present

## 2015-11-25 DIAGNOSIS — M47816 Spondylosis without myelopathy or radiculopathy, lumbar region: Secondary | ICD-10-CM | POA: Diagnosis not present

## 2015-11-25 DIAGNOSIS — M47896 Other spondylosis, lumbar region: Secondary | ICD-10-CM | POA: Diagnosis not present

## 2015-11-25 DIAGNOSIS — M79605 Pain in left leg: Secondary | ICD-10-CM | POA: Diagnosis present

## 2015-11-25 DIAGNOSIS — M170 Bilateral primary osteoarthritis of knee: Secondary | ICD-10-CM | POA: Diagnosis not present

## 2015-11-25 DIAGNOSIS — M545 Low back pain: Secondary | ICD-10-CM | POA: Diagnosis present

## 2015-11-25 DIAGNOSIS — M4806 Spinal stenosis, lumbar region: Secondary | ICD-10-CM | POA: Insufficient documentation

## 2015-11-25 DIAGNOSIS — M79604 Pain in right leg: Secondary | ICD-10-CM | POA: Diagnosis present

## 2015-11-25 DIAGNOSIS — M48062 Spinal stenosis, lumbar region with neurogenic claudication: Secondary | ICD-10-CM

## 2015-11-25 DIAGNOSIS — M5126 Other intervertebral disc displacement, lumbar region: Secondary | ICD-10-CM | POA: Diagnosis not present

## 2015-11-25 DIAGNOSIS — M47817 Spondylosis without myelopathy or radiculopathy, lumbosacral region: Secondary | ICD-10-CM

## 2015-11-25 DIAGNOSIS — M62830 Muscle spasm of back: Secondary | ICD-10-CM | POA: Diagnosis not present

## 2015-11-25 DIAGNOSIS — M533 Sacrococcygeal disorders, not elsewhere classified: Secondary | ICD-10-CM | POA: Insufficient documentation

## 2015-11-25 DIAGNOSIS — M5134 Other intervertebral disc degeneration, thoracic region: Secondary | ICD-10-CM

## 2015-11-25 MED ORDER — HYDROCODONE-ACETAMINOPHEN 10-325 MG PO TABS: ORAL_TABLET | ORAL | 0 refills | Status: DC

## 2015-11-25 MED ORDER — MORPHINE SULFATE ER 30 MG PO TBCR: EXTENDED_RELEASE_TABLET | ORAL | 0 refills | Status: DC

## 2015-11-25 NOTE — Patient Instructions (Addendum)
PLAN   Continue present medication hydrocodone acetaminophen and MS Contin(morphine sulfate extended release)  F/U PCP Dr.Hande  today for evaluation of  lower extremity swelling as discussed and for evaluation of BP and general medical condition  F/U cardiologist as discussed and as scheduled for next week  F/U surgical evaluation. May consider pending follow-up evaluations  Wear brace on knees as previously discussed  F/U with cardiologist as planned  F/U with urologist as planned  F/U neurological evaluation. May consider PNCV/EMG studies and other studies pending follow-up evaluations  May consider radiofrequency rhizolysis or intraspinal procedures pending response to present treatment and F/U evaluation We will avoid such treatment at this time  Patient to call Pain Management Center should patient have concerns prior to scheduled return appointment

## 2015-11-25 NOTE — Progress Notes (Signed)
Safety precautions to be maintained throughout the outpatient stay will include: orient to surroundings, keep bed in low position, maintain call bell within reach at all times, provide assistance with transfer out of bed and ambulation. Went to emergency  10-29-15 for swelling of both lower legs ct scan of chest  ,doppler of both legs.  Chest x-ray done. Saw cardiologist twice.

## 2015-11-25 NOTE — Progress Notes (Signed)
The patient is a 80 year old gentleman who returns to pain management for further evaluation and treatment of pain involving the shoulders as well as the lower back and lower extremity regions.. On today's visit we discussed patient's general medical condition. The patient is with significant lower extremity swelling. The patient will undergo follow-up evaluation with cardiologist and primary care physician as planned. The patient is undergone evaluation of the lower extremities without evidence of thrombosis. The patient also is without evidence of cellulitis of the lower extremity. We will continue present medication consisting of MS Contin with hydrocodone acetaminophen for breakthrough pain and we will remain available to consider additional modifications of treatment pending follow-up evaluation. The patient also has history of pain involving the knees as well as shoulders. We will avoid interventional treatment due to patient's general medical condition at this time. All agreed to suggested treatment plan      Physical examination   There was tenderness over the splenius capitis and occipitalis region palpation which be produced mild discomfort. There was mild tenderness over the cervical facet and thoracic facet regions. Palpation of the acromioclavicular and glenohumeral joint regions reproduce moderate discomfort with limited range of motion of the shoulders noted. The patient appeared to be with bilaterally equal grip strength without significant increase of pain with Tinel and Phalen's maneuver. Palpation over the region of the thoracic region was a tennis to palpation of mild to moderate degree with mild to moderate tenderness of the upper and mid thoracic region and moderate tends to palpation of the lower thoracic paraspinal musculature regions. There was unremarkable Spurling's maneuver. Palpation of the thoracic region was with muscle spasms as mentioned and with out crepitus.  Palpation over the lumbar paraspinal musculatures and lumbar facet region was with moderate tenderness to palpation with lateral bending rotation extension and palpation of the lumbar facets reproducing moderate discomfort. Straight leg raising was tolerates approximately 20 without increase of pain with dorsiflexion noted. The knees were tenderness to palpation with negative anterior and posterior drawer signs and without ballottement of the patella. There was no increased warmth and erythema of the knees noted. Crepitus of the knees were noted. Palpation of the lower extremities reveal patient to be with negative clonus negative Homans and there was 2+ lower extremity edema. Abdomen was nontender and no costovertebral tenderness was noted.       Assessment    Degenerative joint disease of knees  Degenerative joint disease of shoulder  Degenerative disc disease of lumbar spine L2-3 L3-4 L4-5 L5-S1 degenerative changes with lumbar spondylosis with multilevel degenerative disc disease lumbar spine, multifactorial spinal and lateral recess stenosis and foraminal stenosis L3-4 diffuse disc bulging, osteophytic multifactorial spinal stenosis and lateral recess stenosis and foraminal stenosis spinal stenosis L4-L5 and similar changes L5-S1  Sacroiliac joint dysfunction  Degenerative disc disease cervical spine  Cervical facet syndrome  Lower extremity edema      PLAN   Continue present medication hydrocodone acetaminophen and MS Contin(morphine sulfate extended release)  F/U PCP Dr.Hande  today for evaluation of lower extremity swelling as discussed and for evaluation of BP and general medical condition  F/U cardiologist as discussed and as scheduled for next week  F/U surgical evaluation. May consider pending follow-up evaluations  Wear brace on knees as previously discussed  F/U with cardiologist as planned  F/U with urologist as planned  F/U neurological evaluation. May  consider PNCV/EMG studies and other studies pending follow-up evaluations  May consider radiofrequency rhizolysis or intraspinal procedures  pending response to present treatment and F/U evaluation We will avoid such treatment at this time  Patient to call Pain Management Center should patient have concerns prior to scheduled return appointment

## 2015-11-30 ENCOUNTER — Other Ambulatory Visit (INDEPENDENT_AMBULATORY_CARE_PROVIDER_SITE_OTHER): Payer: Medicare Other

## 2015-11-30 DIAGNOSIS — I10 Essential (primary) hypertension: Secondary | ICD-10-CM | POA: Diagnosis not present

## 2015-12-01 LAB — BASIC METABOLIC PANEL
BUN / CREAT RATIO: 15 (ref 10–24)
BUN: 23 mg/dL (ref 8–27)
CHLORIDE: 99 mmol/L (ref 96–106)
CO2: 20 mmol/L (ref 18–29)
Calcium: 9.6 mg/dL (ref 8.6–10.2)
Creatinine, Ser: 1.54 mg/dL — ABNORMAL HIGH (ref 0.76–1.27)
GFR calc Af Amer: 49 mL/min/{1.73_m2} — ABNORMAL LOW (ref >59)
GFR calc non Af Amer: 43 mL/min/{1.73_m2} — ABNORMAL LOW (ref >59)
GLUCOSE: 84 mg/dL (ref 65–99)
POTASSIUM: 4.8 mmol/L (ref 3.5–5.2)
SODIUM: 139 mmol/L (ref 134–144)

## 2015-12-02 ENCOUNTER — Telehealth: Payer: Self-pay | Admitting: *Deleted

## 2015-12-02 DIAGNOSIS — I10 Essential (primary) hypertension: Secondary | ICD-10-CM

## 2015-12-02 NOTE — Telephone Encounter (Signed)
Reviewed lab results with patients wife per release form and Dr. Tora Kindred recommendations to have repeat lab work done in 2 weeks. She verbalized understanding of results, recommendations, and had no further questions at this time.

## 2015-12-02 NOTE — Telephone Encounter (Signed)
-----   Message from Wende Bushy, MD sent at 12/01/2015  9:00 AM EDT ----- Numbers similar to last levels (1.64 crea 1 mo ago). K still ok. Rec rpt bmp in 2 weeks.

## 2015-12-09 ENCOUNTER — Telehealth: Payer: Self-pay | Admitting: *Deleted

## 2015-12-16 ENCOUNTER — Other Ambulatory Visit (INDEPENDENT_AMBULATORY_CARE_PROVIDER_SITE_OTHER): Payer: Medicare Other

## 2015-12-16 DIAGNOSIS — I10 Essential (primary) hypertension: Secondary | ICD-10-CM

## 2015-12-17 LAB — BASIC METABOLIC PANEL
BUN/Creatinine Ratio: 15 (ref 10–24)
BUN: 24 mg/dL (ref 8–27)
CALCIUM: 9.5 mg/dL (ref 8.6–10.2)
CHLORIDE: 98 mmol/L (ref 96–106)
CO2: 18 mmol/L (ref 18–29)
Creatinine, Ser: 1.56 mg/dL — ABNORMAL HIGH (ref 0.76–1.27)
GFR calc Af Amer: 48 mL/min/{1.73_m2} — ABNORMAL LOW (ref >59)
GFR calc non Af Amer: 42 mL/min/{1.73_m2} — ABNORMAL LOW (ref >59)
GLUCOSE: 72 mg/dL (ref 65–99)
POTASSIUM: 4.8 mmol/L (ref 3.5–5.2)
SODIUM: 137 mmol/L (ref 134–144)

## 2015-12-21 NOTE — Progress Notes (Signed)
Cardiology Office Note   Date:  12/27/2015   ID:  JOHNOTHAN WIVELL, DOB Jan 26, 1936, MRN [id number]  Referring Doctor:  Tracie Harrier, MD   Cardiologist:   Wende Bushy, MD   Reason for consultation:  Chief Complaint  Patient presents with  . other    1 month follow up. Pt. c/o shortness of breath at times and has LE edema.       History of Present Illness: Nathan Kramer is a 80 y.o. male who presents for ffup for AS   patient continues to have shortness of breath. Patient is not very forthcoming with his symptoms. According to the wife, he stops multiple times with walking due to many reasons. One is shortness of breath and another reason sometimes is back pain and leg pain brought on by significant spine /back problems.  Since last visit, He has persistent lower extremity edema. He thinks that the Lasix is making him pee a lot and after which, he notices the swelling to go down. A few hours afterwards, the swelling recurs. Patient denies chest pain and loss of consciousness. No fever, cough, colds, abdominal pain. No orthopnea and no PND.   ROS:  Please see the history of present illness. Aside from mentioned under HPI, all other systems are reviewed and negative.     Past Medical History:  Diagnosis Date  . Anemia   . Other chronic pain   . Prostate cancer ([place])   . Reflux esophagitis   . Stroke ([place])   . Undiagnosed cardiac murmurs   . Unspecified essential hypertension   . Unspecified visual loss     Past Surgical History:  Procedure Laterality Date  . back surgery    . COLONOSCOPY       reports that he has been smoking Cigarettes.  He has a 50.00 pack-year smoking history. He has never used smokeless tobacco. He reports that he does not drink alcohol or use drugs.   family history includes Cancer in his sister; Diabetes in his mother; Heart disease in his brother and father; Hypertension in his father and mother.   Current Outpatient Prescriptions    Medication Sig Dispense Refill  . diphenhydrAMINE (BENADRYL) 25 MG tablet Take 25 mg by mouth as needed.     [name] esomeprazole (NEXIUM) 10 MG packet Take 20 mg by mouth daily before breakfast.    . ferrous fumarate (HEMOCYTE - 106 MG FE) 325 (106 FE) MG TABS tablet Take 1 tablet by mouth daily. Reported on 06/24/2015    . furosemide (LASIX) 20 MG tablet Take 1 tablet (20 mg total) by mouth daily. 30 tablet 3  . HYDROcodone-acetaminophen (NORCO) 10-325 MG tablet Limit 1 tablet by mouth 2-4 times per day for breakthrough pain while taking MS Contin if tolerated 120 tablet 0  . morphine (MS CONTIN) 30 MG 12 hr tablet Limit one tab by mouth every  8 - 12  hours if tolerated 90 tablet 0  . Nutritional Supplements (ESTROVEN NIGHTTIME) TABS Take by mouth as needed. Reported on 10/11/2015     Current Facility-Administered Medications  Medication Dose Route Frequency Provider Last Rate Last Dose  . bupivacaine (PF) (MARCAINE) 0.25 % injection 30 mL  30 mL Other Once Mohammed Kindle, MD      . ciprofloxacin (CIPRO) IVPB 400 mg  400 mg Intravenous Once Mohammed Kindle, MD      . fentaNYL (SUBLIMAZE) injection 100 mcg  100 mcg Intravenous Once Mohammed Kindle, MD      .  lactated ringers infusion 1,000 mL  1,000 mL Intravenous Continuous Mohammed Kindle, MD      . lidocaine (PF) (XYLOCAINE) 1 % injection 10 mL  10 mL Subcutaneous Once Mohammed Kindle, MD      . midazolam (VERSED) 5 MG/5ML injection 5 mg  5 mg Intravenous Once Mohammed Kindle, MD      . orphenadrine (NORFLEX) injection 60 mg  60 mg Intramuscular Once Mohammed Kindle, MD      . triamcinolone acetonide (KENALOG-40) injection 40 mg  40 mg Other Once Mohammed Kindle, MD        Allergies: Amoxicillin and Reglan [metoclopramide]    PHYSICAL EXAM: VS:  BP (!) 110/50 (BP Location: Left Arm, Patient Position: Sitting, Cuff Size: Normal)   Pulse 80   Ht 5\' 8"  (1.727 m)   Wt 162 lb (73.5 kg)   BMI 24.63 kg/m  , Body mass index is 24.63 kg/m. Wt Readings from  Last 3 Encounters:  12/27/15 162 lb (73.5 kg)  11/25/15 160 lb (72.6 kg)  11/23/15 162 lb 4 oz (73.6 kg)    GENERAL:  well developed, well nourished, not in acute distress HEENT: normocephalic, pink conjunctivae, anicteric sclerae, no xanthelasma, normal dentition, oropharynx clear NECK:  no neck vein engorgement, JVP normal, no hepatojugular reflux, carotid upstroke brisk and symmetric, no bruit, no thyromegaly, no lymphadenopathy LUNGS:  good respiratory effort, clear to auscultation bilaterally CV:  PMI not displaced, no thrills, no lifts, S1 and S2 within normal limits, no palpable S3 or S4, no rubs, no gallops, 3/6 systolic ejection murmur consistent with aortic stenosis, nonradiating, no delay in carotid upstroke ABD:  Soft, nontender, nondistended, normoactive bowel sounds, no abdominal aortic bruit, no hepatomegaly, no splenomegaly MS: nontender back, no kyphosis, no scoliosis, no joint deformities EXT:  2+ DP/PT pulses,  +2 edema, varicosities, no cyanosis, no clubbing SKIN: warm, nondiaphoretic, normal turgor, no ulcers NEUROPSYCH: alert, oriented to person, place, and time, sensory/motor grossly intact, normal mood, appropriate affect  Recent Labs: 10/29/2015: B Natriuretic Peptide 768.0; Hemoglobin 12.6; Platelets 261 12/16/2015: BUN 24; Creatinine, Ser 1.56; Potassium 4.8; Sodium 137   Lipid Panel No results found for: CHOL, TRIG, HDL, CHOLHDL, VLDL, LDLCALC, LDLDIRECT   Other studies Reviewed:  EKG:  The ekg from 08/24/2015 was personally reviewed by me and it revealed sinus rhythm, 81 BPM. T wave inversions. Poor R-wave progression.  EKG from 12/27/2015 was personally reviewed by me and showed normal sinus rhythm, 79 BPM. T wave inversion. QT 400 ms, QTC 433 ms, within normal limits.  Additional studies/ records that were reviewed personally reviewed by me today include:  Echo 09/07/2015: Left ventricle: The cavity size was normal. There was mild  concentric hypertrophy.  Systolic function was normal. The  estimated ejection fraction was in the range of 55% to 60%. Wall  motion was normal; there were no regional wall motion  abnormalities. Features are consistent with a pseudonormal left  ventricular filling pattern, with concomitant abnormal relaxation  and increased filling pressure (grade 2 diastolic dysfunction). - Aortic valve: There was moderate to severe stenosis. There was  moderate regurgitation. Mean gradient (S): 25 mm Hg. Valve area  (VTI): 0.84 cm^2. Valve area (Vmax): 0.81 cm^2. - Aorta: Ascending aortic diameter: 37 mm (S). - Mitral valve: Calcified annulus. Mildly thickened, moderately  calcified leaflets . The findings are consistent with mild  stenosis. Valve area by continuity equation (using LVOT flow):  1.84 cm^2. - Left atrium: The atrium was mildly dilated. - Pulmonary arteries:  Systolic pressure was moderately increased.  PA peak pressure: 55 mm Hg (S).  Aortic valve Value Reference Aortic valve peak velocity, S 313 cm/s --------- Aortic valve mean velocity, S 237 cm/s --------- Aortic valve VTI, S 72.2 cm --------- Aortic mean gradient, S 25 mm Hg --------- Aortic peak gradient, S 39 mm Hg --------- VTI ratio, LVOT/AV 0.31 --------- Aortic valve area, VTI 0.84 cm^2 --------- Aortic valve area/bsa, VTI 0.45 cm^2/m^2 --------- Velocity ratio, peak, LVOT/AV 0.32 --------- Aortic valve area, peak velocity 0.81 cm^2 --------- Aortic valve area/bsa, peak 0.43 cm^2/m^2 --------- velocity Velocity ratio, mean, LVOT/AV 0.28 --------- Aortic valve area, mean velocity 0.71 cm^2 --------- Aortic valve  area/bsa, mean 0.38 cm^2/m^2 --------- velocity Aortic regurg pressure half-time 302 ms ---------  Nuclear stress is 10/04/2015: Pharmacological myocardial perfusion imaging study with no significant  ischemia GI uptake artifact noted Normal wall motion, EF estimated at 50% (ejection fraction likely reduce secondary to artifact around the inferior wall) No EKG changes concerning for ischemia at peak stress or in recovery. Nonspecific ST abnormality noted at rest Low risk scan  ASSESSMENT AND PLAN:  Aortic stenosis Aortic insufficiency Previous medical records reports mild to moderate aortic stenosis  most recent echocardiogram reveals moderate severe aortic stenosis and moderate aortic insufficiency.  Peak aortic velocity is less than 4 m/s. Mean aortic is 69mmHg, Stress testing did not reveal any evidence of ischemia. Findings discussed with patient. Patient not really leaning towards surgery down the line. He and wife are concerned that his quality of his life is limited mainly by multiple joint issues and back issues. This is mainly what stops him most from doing further physical activity and not necessarily shortness of breath.  Lower extremity swelling Likely congestive heart failure with preserved ejection fraction, acute on chronic Per wife, she is unsure if patient is actually taking the Lasix every day. The plan is to make sure that he is taking the Lasix 20 mg by mouth daily. If the patient notices improvement in his leg swelling, we will continue on this dose. However, if there is no significant improvement, they will inform office. At that time, we will likely increase the Lasix to 20 mg twice a day and recheck a CMP after 5-7 days.  Hypertension BP is well controlled. Continue monitoring BP. Will hold off on metoprolol for now and advised patient to monitor blood pressure closely.   Current medicines are reviewed at length with the patient  today.  The patient does not have concerns regarding medicines.  Labs/ tests ordered today include:  Orders Placed This Encounter  Procedures  . EKG 12-Lead    I had a lengthy and detailed discussion with the patient regarding diagnoses, prognosis, diagnostic options, treatment options , and side effects of medications.   I counseled the patient on importance of lifestyle modification including heart healthy diet, regular physical activity .   Disposition:   FU with undersigned  In one month  Signed, Wende Bushy, MD  12/27/2015 1:15 PM    [facility]

## 2015-12-26 ENCOUNTER — Other Ambulatory Visit: Payer: Self-pay | Admitting: Pain Medicine

## 2015-12-27 ENCOUNTER — Encounter: Payer: Self-pay | Admitting: Cardiology

## 2015-12-27 ENCOUNTER — Ambulatory Visit (INDEPENDENT_AMBULATORY_CARE_PROVIDER_SITE_OTHER): Payer: Medicare Other | Admitting: Cardiology

## 2015-12-27 VITALS — BP 110/50 | HR 80 | Ht 68.0 in | Wt 162.0 lb

## 2015-12-27 DIAGNOSIS — I350 Nonrheumatic aortic (valve) stenosis: Secondary | ICD-10-CM

## 2015-12-27 DIAGNOSIS — I351 Nonrheumatic aortic (valve) insufficiency: Secondary | ICD-10-CM | POA: Diagnosis not present

## 2015-12-27 DIAGNOSIS — I5033 Acute on chronic diastolic (congestive) heart failure: Secondary | ICD-10-CM

## 2015-12-27 DIAGNOSIS — R0602 Shortness of breath: Secondary | ICD-10-CM

## 2015-12-27 DIAGNOSIS — I10 Essential (primary) hypertension: Secondary | ICD-10-CM

## 2015-12-27 NOTE — Patient Instructions (Signed)
Medication Instructions:  If swelling does not improve please give [place] a call.    Follow-Up: Your physician recommends that you schedule a follow-up appointment in: 1 month with Dr. Yvone Neu.   It was a pleasure seeing you today here in the office. Please do not hesitate to give [place] a call back if you have any further questions. [place], BSN

## 2015-12-28 ENCOUNTER — Ambulatory Visit: Payer: Medicare Other | Admitting: Pain Medicine

## 2016-01-02 DIAGNOSIS — I5032 Chronic diastolic (congestive) heart failure: Secondary | ICD-10-CM

## 2016-01-02 HISTORY — DX: Chronic diastolic (congestive) heart failure: I50.32

## 2016-01-11 ENCOUNTER — Encounter: Payer: Self-pay | Admitting: Emergency Medicine

## 2016-01-11 ENCOUNTER — Inpatient Hospital Stay: Payer: Medicare Other

## 2016-01-11 ENCOUNTER — Inpatient Hospital Stay
Admission: EM | Admit: 2016-01-11 | Discharge: 2016-01-16 | DRG: 291 | Disposition: A | Discharge status: Home / Self Care | Payer: Medicare Other | Attending: Internal Medicine | Admitting: Internal Medicine

## 2016-01-11 ENCOUNTER — Emergency Department: Payer: Medicare Other

## 2016-01-11 DIAGNOSIS — Z79891 Long term (current) use of opiate analgesic: Secondary | ICD-10-CM

## 2016-01-11 DIAGNOSIS — Z880 Allergy status to penicillin: Secondary | ICD-10-CM

## 2016-01-11 DIAGNOSIS — Z8249 Family history of ischemic heart disease and other diseases of the circulatory system: Secondary | ICD-10-CM | POA: Diagnosis not present

## 2016-01-11 DIAGNOSIS — M5136 Other intervertebral disc degeneration, lumbar region: Secondary | ICD-10-CM | POA: Diagnosis present

## 2016-01-11 DIAGNOSIS — K219 Gastro-esophageal reflux disease without esophagitis: Secondary | ICD-10-CM | POA: Diagnosis present

## 2016-01-11 DIAGNOSIS — R0609 Other forms of dyspnea: Secondary | ICD-10-CM

## 2016-01-11 DIAGNOSIS — Z8546 Personal history of malignant neoplasm of prostate: Secondary | ICD-10-CM | POA: Diagnosis not present

## 2016-01-11 DIAGNOSIS — H547 Unspecified visual loss: Secondary | ICD-10-CM | POA: Diagnosis present

## 2016-01-11 DIAGNOSIS — Z833 Family history of diabetes mellitus: Secondary | ICD-10-CM | POA: Diagnosis not present

## 2016-01-11 DIAGNOSIS — G894 Chronic pain syndrome: Secondary | ICD-10-CM | POA: Diagnosis present

## 2016-01-11 DIAGNOSIS — Z8673 Personal history of transient ischemic attack (TIA), and cerebral infarction without residual deficits: Secondary | ICD-10-CM

## 2016-01-11 DIAGNOSIS — R600 Localized edema: Secondary | ICD-10-CM

## 2016-01-11 DIAGNOSIS — M48062 Spinal stenosis, lumbar region with neurogenic claudication: Secondary | ICD-10-CM | POA: Diagnosis present

## 2016-01-11 DIAGNOSIS — N189 Chronic kidney disease, unspecified: Secondary | ICD-10-CM

## 2016-01-11 DIAGNOSIS — R911 Solitary pulmonary nodule: Secondary | ICD-10-CM | POA: Diagnosis present

## 2016-01-11 DIAGNOSIS — Z923 Personal history of irradiation: Secondary | ICD-10-CM

## 2016-01-11 DIAGNOSIS — M6281 Muscle weakness (generalized): Secondary | ICD-10-CM

## 2016-01-11 DIAGNOSIS — M170 Bilateral primary osteoarthritis of knee: Secondary | ICD-10-CM | POA: Diagnosis present

## 2016-01-11 DIAGNOSIS — F17210 Nicotine dependence, cigarettes, uncomplicated: Secondary | ICD-10-CM | POA: Diagnosis present

## 2016-01-11 DIAGNOSIS — N179 Acute kidney failure, unspecified: Secondary | ICD-10-CM | POA: Diagnosis present

## 2016-01-11 DIAGNOSIS — I10 Essential (primary) hypertension: Secondary | ICD-10-CM | POA: Diagnosis present

## 2016-01-11 DIAGNOSIS — M5030 Other cervical disc degeneration, unspecified cervical region: Secondary | ICD-10-CM | POA: Diagnosis present

## 2016-01-11 DIAGNOSIS — N183 Chronic kidney disease, stage 3 (moderate): Secondary | ICD-10-CM | POA: Diagnosis present

## 2016-01-11 DIAGNOSIS — Z79899 Other long term (current) drug therapy: Secondary | ICD-10-CM | POA: Diagnosis not present

## 2016-01-11 DIAGNOSIS — I130 Hypertensive heart and chronic kidney disease with heart failure and stage 1 through stage 4 chronic kidney disease, or unspecified chronic kidney disease: Secondary | ICD-10-CM | POA: Diagnosis not present

## 2016-01-11 DIAGNOSIS — Z66 Do not resuscitate: Secondary | ICD-10-CM | POA: Diagnosis present

## 2016-01-11 DIAGNOSIS — I352 Nonrheumatic aortic (valve) stenosis with insufficiency: Secondary | ICD-10-CM | POA: Diagnosis present

## 2016-01-11 DIAGNOSIS — M47817 Spondylosis without myelopathy or radiculopathy, lumbosacral region: Secondary | ICD-10-CM

## 2016-01-11 DIAGNOSIS — M5134 Other intervertebral disc degeneration, thoracic region: Secondary | ICD-10-CM

## 2016-01-11 DIAGNOSIS — R601 Generalized edema: Secondary | ICD-10-CM | POA: Diagnosis not present

## 2016-01-11 DIAGNOSIS — I509 Heart failure, unspecified: Secondary | ICD-10-CM

## 2016-01-11 DIAGNOSIS — I5033 Acute on chronic diastolic (congestive) heart failure: Secondary | ICD-10-CM | POA: Diagnosis not present

## 2016-01-11 DIAGNOSIS — M533 Sacrococcygeal disorders, not elsewhere classified: Secondary | ICD-10-CM

## 2016-01-11 DIAGNOSIS — I350 Nonrheumatic aortic (valve) stenosis: Secondary | ICD-10-CM | POA: Diagnosis not present

## 2016-01-11 DIAGNOSIS — R188 Other ascites: Secondary | ICD-10-CM

## 2016-01-11 LAB — CBC
HCT: 36.4 % — ABNORMAL LOW (ref 40.0–52.0)
Hemoglobin: 11.4 g/dL — ABNORMAL LOW (ref 13.0–18.0)
MCH: 19 pg — ABNORMAL LOW (ref 26.0–34.0)
MCHC: 31.4 g/dL — ABNORMAL LOW (ref 32.0–36.0)
MCV: 60.7 fL — AB (ref 80.0–100.0)
PLATELETS: 248 10*3/uL (ref 150–440)
RBC: 6 MIL/uL — ABNORMAL HIGH (ref 4.40–5.90)
RDW: 21.4 % — AB (ref 11.5–14.5)
WBC: 12.9 10*3/uL — AB (ref 3.8–10.6)

## 2016-01-11 LAB — BASIC METABOLIC PANEL
Anion gap: 8 (ref 5–15)
BUN: 32 mg/dL — AB (ref 6–20)
CALCIUM: 9.2 mg/dL (ref 8.9–10.3)
CO2: 27 mmol/L (ref 22–32)
CREATININE: 1.7 mg/dL — AB (ref 0.61–1.24)
Chloride: 100 mmol/L — ABNORMAL LOW (ref 101–111)
GFR calc Af Amer: 43 mL/min — ABNORMAL LOW (ref >60)
GFR, EST NON AFRICAN AMERICAN: 37 mL/min — AB (ref >60)
Glucose, Bld: 98 mg/dL (ref 65–99)
Potassium: 4.2 mmol/L (ref 3.5–5.1)
SODIUM: 135 mmol/L (ref 135–145)

## 2016-01-11 LAB — HEPATIC FUNCTION PANEL
ALK PHOS: 87 U/L (ref 38–126)
ALT: 9 U/L — ABNORMAL LOW (ref 17–63)
AST: 31 U/L (ref 15–41)
Albumin: 4.1 g/dL (ref 3.5–5.0)
BILIRUBIN INDIRECT: 1.1 mg/dL — AB (ref 0.3–0.9)
Bilirubin, Direct: 0.4 mg/dL (ref 0.1–0.5)
TOTAL PROTEIN: 7.1 g/dL (ref 6.5–8.1)
Total Bilirubin: 1.5 mg/dL — ABNORMAL HIGH (ref 0.3–1.2)

## 2016-01-11 LAB — TROPONIN I

## 2016-01-11 LAB — BRAIN NATRIURETIC PEPTIDE: B NATRIURETIC PEPTIDE 5: 1166 pg/mL — AB (ref 0.0–100.0)

## 2016-01-11 MED ORDER — SODIUM CHLORIDE 0.9 % IV SOLN: 250.0000 mL | INTRAVENOUS | Status: DC | PRN

## 2016-01-11 MED ORDER — ONDANSETRON HCL 4 MG/2ML IJ SOLN: 4.0000 mg | Freq: Four times a day (QID) | INTRAMUSCULAR | Status: DC | PRN

## 2016-01-11 MED ORDER — SALINE SPRAY 0.65 % NA SOLN
1.0000 | NASAL | Status: DC | PRN
  Administered 2016-01-12: 1 via NASAL
  Filled 2016-01-11: qty 44

## 2016-01-11 MED ORDER — MORPHINE SULFATE ER 30 MG PO TBCR: 30.0000 mg | EXTENDED_RELEASE_TABLET | Freq: Two times a day (BID) | ORAL | Status: DC

## 2016-01-11 MED ORDER — ACETAMINOPHEN 325 MG PO TABS: 650.0000 mg | ORAL_TABLET | ORAL | Status: DC | PRN

## 2016-01-11 MED ORDER — HYDROCODONE-ACETAMINOPHEN 10-325 MG PO TABS
1.0000 | ORAL_TABLET | ORAL | Status: DC | PRN
  Administered 2016-01-11 – 2016-01-16 (×12): 1 via ORAL
  Filled 2016-01-11 (×12): qty 1

## 2016-01-11 MED ORDER — MORPHINE SULFATE ER 30 MG PO TBCR
30.0000 mg | EXTENDED_RELEASE_TABLET | Freq: Three times a day (TID) | ORAL | Status: DC
  Administered 2016-01-11 – 2016-01-16 (×14): 30 mg via ORAL
  Filled 2016-01-11 (×14): qty 1

## 2016-01-11 MED ORDER — POTASSIUM CHLORIDE CRYS ER 20 MEQ PO TBCR
20.0000 meq | EXTENDED_RELEASE_TABLET | Freq: Two times a day (BID) | ORAL | Status: DC
  Administered 2016-01-11 – 2016-01-15 (×9): 20 meq via ORAL
  Filled 2016-01-11 (×9): qty 1

## 2016-01-11 MED ORDER — SODIUM CHLORIDE 0.9% FLUSH: 3.0000 mL | INTRAVENOUS | Status: DC | PRN

## 2016-01-11 MED ORDER — ENOXAPARIN SODIUM 40 MG/0.4ML ~~LOC~~ SOLN
40.0000 mg | SUBCUTANEOUS | Status: DC
  Administered 2016-01-11 – 2016-01-15 (×5): 40 mg via SUBCUTANEOUS
  Filled 2016-01-11 (×5): qty 0.4

## 2016-01-11 MED ORDER — FAMOTIDINE 20 MG PO TABS
20.0000 mg | ORAL_TABLET | Freq: Every day | ORAL | Status: DC
  Administered 2016-01-12 – 2016-01-16 (×5): 20 mg via ORAL
  Filled 2016-01-11 (×5): qty 1

## 2016-01-11 MED ORDER — FUROSEMIDE 10 MG/ML IJ SOLN
20.0000 mg | Freq: Two times a day (BID) | INTRAMUSCULAR | Status: DC
  Administered 2016-01-11 – 2016-01-12 (×2): 20 mg via INTRAVENOUS
  Filled 2016-01-11 (×3): qty 2

## 2016-01-11 MED ORDER — SODIUM CHLORIDE 0.9% FLUSH
3.0000 mL | Freq: Two times a day (BID) | INTRAVENOUS | Status: DC
  Administered 2016-01-11 – 2016-01-15 (×10): 3 mL via INTRAVENOUS

## 2016-01-11 MED ORDER — FUROSEMIDE 10 MG/ML IJ SOLN
40.0000 mg | Freq: Once | INTRAMUSCULAR | Status: AC
  Administered 2016-01-11: 40 mg via INTRAVENOUS
  Filled 2016-01-11: qty 4

## 2016-01-11 MED ORDER — PANTOPRAZOLE SODIUM 40 MG PO PACK
40.0000 mg | PACK | Freq: Every day | ORAL | Status: DC
  Administered 2016-01-12 – 2016-01-16 (×5): 40 mg via ORAL
  Filled 2016-01-11 (×7): qty 20

## 2016-01-11 NOTE — Consult Note (Signed)
Cardiology Consultation Note  Patient ID: Nathan Kramer, MRN: [id number], DOB/AGE: [birth year] 78 y.o. Admit date: 01/11/2016   Date of Consult: 01/11/2016 Primary Physician: Tracie Harrier, MD Primary Cardiologist: Dr. Yvone Neu, MD Requesting Physician: Dr. Benjie Karvonen, MD  Chief Complaint: SOB, dizziness, LE swelling Reason for Consult: Same  HPI: 80 y.o. male with h/o chronic diastolic CHF, prior stroke 09/2012, moderate to severe aortic stenosis, moderate AI, chronic LE edema, chronic back pain, and prostate cancer s/p radiation who presented to [facility] with increaed SOB and LE swelling.   He has been followed by Dr. Yvone Neu for his aortic stenosis as well as his chronic diastolic CHF/LE edema. Back in June 2017 he was noting increased SOB. He initially underwent echo on 09/07/15 that showed an EF of 55-60%, no RWMA, GR2DD, moderate to severe aortic stenosis with a valve area of 0.84, meand gradient of 25 mmHg, moderate AI, mildly dilated ascending aorta at 37 mm, mild MS, LA mildly dilated, and elevated PASP at 55 mmHg. This was followed by a Lexiscan Myoview on 7/3 that showed no significant ischemia, normal wall motion, EF 55%, no EKG changes, low risk scan overall. He was seen in the ED in late July for increae in LE swelling and concern for possible DVT. Lower extremity ultransound was negative for DVT. In multiple follow up visits with Dr. Yvone Neu he continued to note continued SOB. he was diuresed and continued to decline invasive testing or procedures regarding his symptoms and known AS. He was most recently seen on9/25 with continud SOB and LE swelling that improved with Lasix. There was some question if the patient was taking his Lasix daily or not.   He presented to [facility] ED on 10/10 with a greater than one month history LE swelling and abdominal distension as well as increased SOB. He reports for the past 1 month he has had to setop and take breaks while walking to and from his mailbox. He also notes  some occasional dizziness. Never with chest pain, nausea, vomiting, diaphoresis, presyncope, or syncope. He denies any early satiety. He has slept in a recliner for 6-8 years secondary to chronic back pain.   Upon his arrival he was noted to have a BNP of 1166, troponin negative x 1 at time of cardiology consult, SCr 1.70 (baseline 1.4-1.5), BUN 32, K+ 4.2, WBC 12.9, hgb 11.4. CXR was negative for acute cardiopulmonary process. EKG NSR, 89 bpm, possible anterior infarct, lateral TWI (old). He was given IV Lasix 40 mg in the ED good UOP to date at approximately 500 mL this afternoon. Currently on room air with O2 saturations of 100%.   Past Medical History:  Diagnosis Date  . Anemia   . Other chronic pain   . Prostate cancer ([place])   . Reflux esophagitis   . Stroke ([place])   . Undiagnosed cardiac murmurs   . Unspecified essential hypertension   . Unspecified visual loss       Most Recent Cardiac Studies: As above   Surgical History:  Past Surgical History:  Procedure Laterality Date  . back surgery    . COLONOSCOPY       Home Meds: Prior to Admission medications   Medication Sig Start Date End Date Taking? Authorizing Provider  diphenhydrAMINE (BENADRYL) 25 MG tablet Take 25 mg by mouth as needed.    Yes Historical Provider, MD  esomeprazole (NEXIUM) 10 MG packet Take 20 mg by mouth daily before breakfast.   Yes Historical Provider, MD  furosemide (LASIX)  20 MG tablet Take 1 tablet (20 mg total) by mouth daily. 11/23/15  Yes Wende Bushy, MD  HYDROcodone-acetaminophen (NORCO) 10-325 MG tablet Limit 1 tablet by mouth 2-4 times per day for breakthrough pain while taking MS Contin if tolerated 11/25/15  Yes Mohammed Kindle, MD  morphine (MS CONTIN) 30 MG 12 hr tablet Limit one tab by mouth every  8 - 12  hours if tolerated 11/25/15  Yes Mohammed Kindle, MD  ranitidine (ZANTAC) 150 MG tablet Take 150 mg by mouth daily.   Yes Historical Provider, MD    Inpatient Medications:  . enoxaparin  (LOVENOX) injection  40 mg Subcutaneous Q24H  . [START ON 01/12/2016] famotidine  20 mg Oral Daily  . furosemide  20 mg Intravenous Q12H  . morphine  30 mg Oral Q12H  . [START ON 01/12/2016] pantoprazole sodium  40 mg Oral QAC breakfast  . potassium chloride  20 mEq Oral BID  . sodium chloride flush  3 mL Intravenous Q12H      Allergies:  Allergies  Allergen Reactions  . Amoxicillin     Other reaction(s): Localized superficial swelling of skin Face swelling   . Reglan [Metoclopramide]     Involuntary movements body    Social History   Social History  . Marital status: Married    Spouse name: N/A  . Number of children: N/A  . Years of education: N/A   Occupational History  . Not on file.   Social History Main Topics  . Smoking status: Current Some Day Smoker    Packs/day: 1.00    Years: 50.00    Types: Cigarettes  . Smokeless tobacco: Never Used  . Alcohol use No  . Drug use: No  . Sexual activity: Not on file   Other Topics Concern  . Not on file   Social History Narrative  . No narrative on file     Family History  Problem Relation Age of Onset  . Diabetes Mother   . Hypertension Mother   . Hypertension Father   . Heart disease Father   . Cancer Sister   . Heart disease Brother      Review of Systems: Review of Systems  Constitutional: Positive for malaise/fatigue. Negative for chills, diaphoresis, fever and weight loss.  HENT: Negative for congestion.   Eyes: Negative for discharge and redness.  Respiratory: Positive for shortness of breath. Negative for cough, hemoptysis, sputum production and wheezing.   Cardiovascular: Positive for orthopnea and leg swelling. Negative for chest pain, palpitations, claudication and PND.  Gastrointestinal: Negative for abdominal pain, blood in stool, heartburn, melena, nausea and vomiting.  Genitourinary: Negative for hematuria.  Musculoskeletal: Negative for falls and myalgias.  Skin: Negative for rash.    Neurological: Positive for dizziness and weakness. Negative for tingling, tremors, sensory change, speech change, focal weakness and loss of consciousness.  Endo/Heme/Allergies: Does not bruise/bleed easily.  Psychiatric/Behavioral: Negative for substance abuse. The patient is not nervous/anxious.   All other systems reviewed and are negative.   Labs:  Recent Labs  01/11/16 1055  TROPONINI <0.03   Lab Results  Component Value Date   WBC 12.9 (H) 01/11/2016   HGB 11.4 (L) 01/11/2016   HCT 36.4 (L) 01/11/2016   MCV 60.7 (L) 01/11/2016   PLT 248 01/11/2016    Recent Labs Lab 01/11/16 1055  NA 135  K 4.2  CL 100*  CO2 27  BUN 32*  CREATININE 1.70*  CALCIUM 9.2  PROT 7.1  BILITOT 1.5*  ALKPHOS 87  ALT 9*  AST 31  GLUCOSE 98   No results found for: CHOL, HDL, LDLCALC, TRIG No results found for: DDIMER  Radiology/Studies:  Ct Abdomen Pelvis Wo Contrast  Result Date: 01/11/2016 CLINICAL DATA:  80 year old male with a history of lower extremity edema. EXAM: CT ABDOMEN AND PELVIS WITHOUT CONTRAST TECHNIQUE: Multidetector CT imaging of the abdomen and pelvis was performed following the standard protocol without IV contrast. Patient is position on his right side for comfort COMPARISON:  MR lumbar spine 04/24/2013, CT chest 10/29/2015 FINDINGS: Lower chest: Calcifications/stents within coronary arteries. Trace pericardial fluid/thickening, unchanged from comparison CT. Calcifications of the aortic valve. Sub solid nodule of the left lower lobe again demonstrated, not significantly changed in size or configuration from the comparison. Right middle lobe sub solid nodule also visualized. Similar appearance of linear scarring/atelectasis at the lung bases. Early bronchiectasis. Trace pleural fluid bilaterally. Hepatobiliary: Unremarkable appearance of liver. Unremarkable appearance of the gallbladder. No intrahepatic or extrahepatic biliary ductal dilatation. Pancreas: Atrophic  pancreatic parenchyma. No significant calcifications. Spleen: Greatest diameter of the spleen measures over 17 cm. Adrenals/Urinary Tract: Unremarkable appearance of the adrenal glands. Right: Nonobstructive stone within the right collecting system measuring 7 mm. Stone or a vascular calcification at the inferior right renal cortex measuring 10 mm. No right-sided hydronephrosis. Multiple rounded hypodense lesions of the right kidney cortex, some which are too small to characterize. Left: Small nonobstructive stones versus vascular calcifications at the inferior left collecting system measuring 3 mm - 4 mm. No left hydronephrosis. Multiple rounded low-density cystic lesions on the left. Largest on the anterior lateral cortex measures 5 cm. The smallest are too small to characterize by CT. Stomach/Bowel: Unremarkable appearance of stomach. Inflammatory changes and adjacent to the proximal duodenum. Probable duodenum diverticulum. No abnormally distended small bowel. Normal appendix. No evidence of colonic obstruction. Colonic diverticular disease. No associated inflammatory changes. Mesenteric: Free fluid layered over the dome of the liver, extending into the hilum of the liver and into the right pericolic gutter. Note that the patient is position on is right for this CT. Fluid layered dependently within the anatomic pelvis. Vascular/Lymphatic: Hazy infiltration of the small bowel mesenteric. Small lymph nodes present. No significantly enlarged lymph nodes. Scattered calcifications of the abdominal aorta.  No aneurysm. Reproductive: Calcifications of the prostate. Other: Small fact containing umbilical hernia. Musculoskeletal: No displaced fracture. Multilevel degenerative changes of the thoracolumbar spine. Bilateral facet disease of the lower lumbar levels. No significant degenerative changes of the hips. IMPRESSION: Compared to CT of the chest 10/21/2015, there has been interval development of small volume free  fluid within the upper abdomen and infiltration of the mesentery. The etiology of the free fluid is uncertain, and can be related to generalized edema/ anasarca and a positive fluid balance, although reactive process within the abdomen could also contribute to development of free fluid. If there is concern for acute intra-abdominal process, contrast-enhanced CT may be useful. Cardiomegaly and small bilateral pleural effusions. Splenomegaly. Aortic atherosclerosis. Coronary artery disease. Aortic valve calcifications. Bilateral renal cysts, which are most likely benign though not fully characterize on this noncontrast study. Bilateral nonobstructive nephrolithiasis. Diverticular disease without evidence of acute diverticulitis. Sub solid nodules of the left lower lobe and right middle lobe. As was noted on prior chest CT, a follow-up chest CT between 3 and 6 months (after 10/29/2015) is recommended. Signed, Dulcy Fanny. Earleen Newport, DO [facility] Electronically Signed   By: Corrie Mckusick D.O.  On: 01/11/2016 14:20   Dg Chest 2 View  Result Date: 01/11/2016 CLINICAL DATA:  Shortness of breath EXAM: CHEST  2 VIEW COMPARISON:  10/29/2015 FINDINGS: Volume loss and pleural thickening on the right. There is scarring in the bases as noted previously. There is no edema, consolidation, effusion, or pneumothorax. Chronic cardiomegaly. Aortic tortuosity. IMPRESSION: 1. No acute finding. 2. Nodular opacity in the left lower lobe by July 2017 chest CT has not been visible by radiography. 3. Chronic right pleural thickening and volume loss. Mild basilar scarring. Electronically Signed   By: Monte Fantasia M.D.   On: 01/11/2016 11:29    EKG: Interpreted by me showed: NSR, 89 bpm, possible anterior infarct, lateral TWI (old) Telemetry: Interpreted by me showed: NSR, 70's bpm  Weights: Filed Weights   01/11/16 1051 01/11/16 1342  Weight: 160 lb (72.6 kg) 162 lb 3.2 oz  (73.6 kg)     Physical Exam: Blood pressure (!) 130/58, pulse 78, temperature 97.4 F (36.3 C), temperature source Oral, resp. rate 14, height 5\' 8"  (1.727 m), weight 162 lb 3.2 oz (73.6 kg), SpO2 100 %. Body mass index is 24.66 kg/m. General: Well developed, well nourished, in no acute distress. Head: Normocephalic, atraumatic, sclera non-icteric, no xanthomas, nares are without discharge.  Neck: Negative for carotid bruits. JVD elevated. Lungs: Bilateral crackles along the bases. Breathing is unlabored. Heart: RRR with S1 S2. No murmurs, rubs, or gallops appreciated. Abdomen: Soft, non-tender, distended with normoactive bowel sounds. No hepatomegaly. No rebound/guarding. No obvious abdominal masses. Msk:  Strength and tone appear normal for age. Extremities: No clubbing or cyanosis. 2+ pitting edema to the bilateral thighs. Distal pedal pulses are 2+ and equal bilaterally. Neuro: Alert and oriented X 3. No facial asymmetry. No focal deficit. Moves all extremities spontaneously. Psych:  Responds to questions appropriately with a normal affect.    Assessment and Plan:  Principal Problem:   Acute on chronic diastolic CHF (congestive heart failure), NYHA class 2 (HCC) Active Problems:   Aortic stenosis, moderate   Acute kidney injury superimposed on CKD (HCC)   HTN (hypertension)    1. Acute on chronic diastolic CHF/LE swelling/anasarca:  -Weight upon admission 160 pounds which is stable compared to most recent clinic weight of 162 pounds on 9/25  -Agree with gentle diuresis with KCl repletion  -Has had good UOP to date -No need for repeat echo given recent study  -Recommend R/LHC as below s/p diuresis  -Cmet and abdominal CT pending to evaluate for outside etiologies of his symptoms  -Daily weights and I&O   2. Acute on CKD stage II:  -Monitor closely with diuresis  -Renal function may improve with diuresis   3. Moderate to severe aortic stenosis:  -Family has previously  mentioned numerous times they do not want any invasive testing or procedures  -Upon talking with the patient today he would be ok with a R/LHC s/p diuresis to further evaluate his symptoms and AS, though refuses any "cutting." He reports should any studies indicate he needs an AVR he refuses any open procedure and would refuse possible cardiac bypass at this time -Recommend continued diuresis at this time with possible R/LHC in a couple of days s/p diuresis and improved renal function. He is agreeable to this -Monitor fluid status closely given his valvular heart disease  4. HTN: -Controlled -Continue current medications   Signed, Marcille Blanco [facility] Pager: [phone] 01/11/2016, 3:31 PM

## 2016-01-11 NOTE — ED Provider Notes (Signed)
[facility] Emergency Department Provider Note  ____________________________________________   I have reviewed the triage vital signs and the nursing notes.   HISTORY  Chief Complaint Shortness of Breath and Leg Swelling    HPI Nathan Kramer is a 80 y.o. male who states that he has been on HCTZ and had some mild fluid issues in the past over the last month he's had greatly increased fluid retention. He was therefore started on Lasix recently for this. He states that is gotten to the point where he cannot walk across the room without getting too winded to move. His legs are swelling up bilaterally. He is not having any chest pain. Does have orthopnea.      Past Medical History:  Diagnosis Date  . Anemia   . Other chronic pain   . Prostate cancer ([place])   . Reflux esophagitis   . Stroke ([place])   . Undiagnosed cardiac murmurs   . Unspecified essential hypertension   . Unspecified visual loss     Patient Active Problem List   Diagnosis Date Noted  . Allergic state 07/05/2015  . Current tobacco use 07/05/2015  . Lumbar canal stenosis 07/05/2015  . Discharge from nose 02/15/2015  . Nutritional anemia 10/14/2014  . DJD (degenerative joint disease) of knee 09/16/2014  . DDD (degenerative disc disease), lumbar 08/31/2014  . DDD (degenerative disc disease), thoracic 08/31/2014  . DDD (degenerative disc disease), cervical 08/31/2014  . Lumbosacral facet joint syndrome 08/31/2014  . Spinal stenosis, lumbar region, with neurogenic claudication 08/31/2014  . Sacroiliac joint dysfunction 08/31/2014  . HTN (hypertension) 08/20/2014  . Cardiac murmur 06/11/2014  . Chronic LBP 06/11/2014  . Gastro-esophageal reflux disease without esophagitis 06/11/2014  . Decreased vision in both eyes 06/11/2014  . H/O malignant neoplasm of prostate 06/11/2014  . Aortic stenosis, moderate 03/11/2013  . Sinus tachycardia 03/11/2013    Past Surgical History:  Procedure  Laterality Date  . back surgery    . COLONOSCOPY      Prior to Admission medications   Medication Sig Start Date End Date Taking? Authorizing Provider  diphenhydrAMINE (BENADRYL) 25 MG tablet Take 25 mg by mouth as needed.     Historical Provider, MD  esomeprazole (NEXIUM) 10 MG packet Take 20 mg by mouth daily before breakfast.    Historical Provider, MD  ferrous fumarate (HEMOCYTE - 106 MG FE) 325 (106 FE) MG TABS tablet Take 1 tablet by mouth daily. Reported on 06/24/2015    Historical Provider, MD  furosemide (LASIX) 20 MG tablet Take 1 tablet (20 mg total) by mouth daily. 11/23/15   Wende Bushy, MD  HYDROcodone-acetaminophen (NORCO) 10-325 MG tablet Limit 1 tablet by mouth 2-4 times per day for breakthrough pain while taking MS Contin if tolerated 11/25/15   Mohammed Kindle, MD  morphine (MS CONTIN) 30 MG 12 hr tablet Limit one tab by mouth every  8 - 12  hours if tolerated 11/25/15   Mohammed Kindle, MD  Nutritional Supplements (ESTROVEN NIGHTTIME) TABS Take by mouth as needed. Reported on 10/11/2015    Historical Provider, MD    Allergies Amoxicillin and Reglan [metoclopramide]  Family History  Problem Relation Age of Onset  . Diabetes Mother   . Hypertension Mother   . Hypertension Father   . Heart disease Father   . Cancer Sister   . Heart disease Brother     Social History Social History  Substance Use Topics  . Smoking status: Current Some Day Smoker  Packs/day: 1.00    Years: 50.00    Types: Cigarettes  . Smokeless tobacco: Never Used  . Alcohol use No    Review of Systems Constitutional: No fever/chills Eyes: No visual changes. ENT: No sore throat. No stiff neck no neck pain Cardiovascular: Denies chest pain. Respiratory: Denies shortness of breath. Gastrointestinal:   no vomiting.  No diarrhea.  No constipation. Genitourinary: Negative for dysuria. Musculoskeletal: Negative lower extremity swelling Skin: Negative for rash. Neurological: Negative for severe  headaches, focal weakness or numbness. 10-point ROS otherwise negative.  ____________________________________________   PHYSICAL EXAM:  VITAL SIGNS: ED Triage Vitals  Enc Vitals Group     BP 01/11/16 1051 (!) 122/96     Pulse Rate 01/11/16 1051 88     Resp 01/11/16 1051 18     Temp 01/11/16 1051 98.2 F (36.8 C)     Temp Source 01/11/16 1051 Oral     SpO2 01/11/16 1051 97 %     Weight 01/11/16 1051 160 lb (72.6 kg)     Height 01/11/16 1051 5\' 8"  (1.727 m)     Head Circumference --      Peak Flow --      Pain Score 01/11/16 1151 10     Pain Loc --      Pain Edu? --      Excl. in [place]? --     Constitutional: Alert and oriented. Well appearing and in no acute distress. Eyes: Conjunctivae are normal. PERRL. EOMI. Head: Atraumatic. Nose: No congestion/rhinnorhea. Mouth/Throat: Mucous membranes are moist.  Oropharynx non-erythematous. Neck: No stridor.   Nontender with no meningismus Cardiovascular: Normal rate, regular rhythm. Grossly normal heart sounds.  Good peripheral circulation. Respiratory: Normal respiratory effort.  No retractions. Lungs Occasional bibasilar rails. Abdominal: Soft and nontender. No distention. No guarding no rebound Back:  There is no focal tenderness or step off.  there is no midline tenderness there are no lesions noted. there is no CVA tenderness Musculoskeletal: No lower extremity tenderness, no upper extremity tenderness. No joint effusions, no DVT signs strong distal pulses 3+ bilateral pitting edema Neurologic:  Normal speech and language. No gross focal neurologic deficits are appreciated.  Skin:  Skin is warm, dry and intact. No rash noted. Psychiatric: Mood and affect are normal. Speech and behavior are normal.  ____________________________________________   LABS (all labs ordered are listed, but only abnormal results are displayed)  Labs Reviewed  BASIC METABOLIC PANEL - Abnormal; Notable for the following:       Result Value   Chloride  100 (*)    BUN 32 (*)    Creatinine, Ser 1.70 (*)    GFR calc non Af Amer 37 (*)    GFR calc Af Amer 43 (*)    All other components within normal limits  CBC - Abnormal; Notable for the following:    WBC 12.9 (*)    RBC 6.00 (*)    Hemoglobin 11.4 (*)    HCT 36.4 (*)    MCV 60.7 (*)    MCH 19.0 (*)    MCHC 31.4 (*)    RDW 21.4 (*)    All other components within normal limits  BRAIN NATRIURETIC PEPTIDE - Abnormal; Notable for the following:    B Natriuretic Peptide 1,166.0 (*)    All other components within normal limits  TROPONIN I  HEPATIC FUNCTION PANEL   ____________________________________________  EKG  I personally interpreted any EKGs ordered by me or triage Sinus rhythm rate 89 bpm,  flipped T waves inferiorly, no acute ST elevation or depression ____________________________________________  RADIOLOGY  I reviewed any imaging ordered by me or triage that were performed during my shift and, if possible, patient and/or family made aware of any abnormal findings. ____________________________________________   PROCEDURES  Procedure(s) performed: None  Procedures  Critical Care performed: None  ____________________________________________   INITIAL IMPRESSION / ASSESSMENT AND PLAN / ED COURSE  Pertinent labs & imaging results that were available during my care of the patient were reviewed by me and considered in my medical decision making (see chart for details).  Patient here with orthopnea and lower extremity swelling, has clear evidence of CHF. Has already been tried on home Lasix with minimal success. Has having increasing dyspnea on exertion. BNP is significant only elevated from prior check, fortunately, this time there is no evidence of respiratory distress. His BUN/creatinine are somewhat elevated possibly secondary to Lasix use. We will unfortunately have to give him a diuretic here I feel we will have to admit him given his age and progressive decline of  cardia pulmonary status. Thing at this time to suggest PE or dissection.  Clinical Course   ____________________________________________   FINAL CLINICAL IMPRESSION(S) / ED DIAGNOSES  Final diagnoses:  None      This chart was dictated using voice recognition software.  Despite best efforts to proofread,  errors can occur which can change meaning.      Schuyler Amor, MD 01/11/16 [phone]

## 2016-01-11 NOTE — Progress Notes (Signed)
Patient admitted to 2A from ED. Alert and oriented, able to make needs known. Tele box verified with Roselyn Reef NT. Skin assessed and verified with Tammy RN. Patient noted with bilateral lower extremities. Patient oriented to room. Family at bedside with patient.  Patient stable at this time, no acute distress noted. Will continue to monitor.

## 2016-01-11 NOTE — ED Triage Notes (Signed)
Patient presents with wife with shortness of breath worse over last 2 days. Wife reports that he has some swelling around his waist and legs. Patient reports his legs feel tight. Patient has seen cardiology placed on lasix.

## 2016-01-11 NOTE — H&P (Signed)
[facility] NAME: Nathan Kramer    MR#:  [id number]  DATE OF BIRTH:  1936-03-06  DATE OF ADMISSION:  01/11/2016  PRIMARY CARE PHYSICIAN: Tracie Harrier, MD   REQUESTING/REFERRING PHYSICIAN: dr Burlene Arnt  CHIEF COMPLAINT:   Shortness of breath and lower extremity edema over the past month HISTORY OF PRESENT ILLNESS:  Nathan Kramer  is a 80 y.o. male with a known history of Stage II diastolic heart failure with preserved ejection fraction, moderate to severe stenosis who presents above complaint. Patient reports over the past month he has had increasing lower extremity edema as well as increasing abdominal girth. He is complaining shortness of breath, dyspnea exertion and orthopnea. He is now sitting up to sleep and cannot lay flat.    PAST MEDICAL HISTORY:   Past Medical History:  Diagnosis Date  . Anemia   . Other chronic pain   . Prostate cancer ([place])   . Reflux esophagitis   . Stroke ([place])   . Undiagnosed cardiac murmurs   . Unspecified essential hypertension   . Unspecified visual loss     PAST SURGICAL HISTORY:   Past Surgical History:  Procedure Laterality Date  . back surgery    . COLONOSCOPY      SOCIAL HISTORY:   Social History  Substance Use Topics  . Smoking status: Current Some Day Smoker    Packs/day: 1.00    Years: 50.00    Types: Cigarettes  . Smokeless tobacco: Never Used  . Alcohol use No    FAMILY HISTORY:   Family History  Problem Relation Age of Onset  . Diabetes Mother   . Hypertension Mother   . Hypertension Father   . Heart disease Father   . Cancer Sister   . Heart disease Brother     DRUG ALLERGIES:   Allergies  Allergen Reactions  . Amoxicillin     Other reaction(s): Localized superficial swelling of skin Face swelling   . Reglan [Metoclopramide]     Involuntary movements body    REVIEW OF SYSTEMS:   Review of Systems  Constitutional: Negative for chills,  fever and malaise/fatigue.  HENT: Negative.  Negative for ear discharge, ear pain, hearing loss, nosebleeds and sore throat.   Eyes: Negative.  Negative for blurred vision and pain.  Respiratory: Positive for shortness of breath. Negative for cough, hemoptysis and wheezing.   Cardiovascular: Positive for leg swelling and PND. Negative for chest pain and palpitations.  Gastrointestinal: Negative.  Negative for abdominal pain, blood in stool, diarrhea, nausea and vomiting.  Genitourinary: Negative.  Negative for dysuria.  Musculoskeletal: Negative.  Negative for back pain.  Skin: Negative.   Neurological: Positive for weakness. Negative for dizziness, tremors, speech change, focal weakness, seizures and headaches.  Endo/Heme/Allergies: Negative.  Does not bruise/bleed easily.  Psychiatric/Behavioral: Negative.  Negative for depression, hallucinations and suicidal ideas.    MEDICATIONS AT HOME:   Prior to Admission medications   Medication Sig Start Date End Date Taking? Authorizing Provider  diphenhydrAMINE (BENADRYL) 25 MG tablet Take 25 mg by mouth as needed.     Historical Provider, MD  esomeprazole (NEXIUM) 10 MG packet Take 20 mg by mouth daily before breakfast.    Historical Provider, MD  ferrous fumarate (HEMOCYTE - 106 MG FE) 325 (106 FE) MG TABS tablet Take 1 tablet by mouth daily. Reported on 06/24/2015    Historical Provider, MD  furosemide (LASIX) 20 MG tablet Take 1 tablet (20 mg total)  by mouth daily. 11/23/15   Wende Bushy, MD  HYDROcodone-acetaminophen (NORCO) 10-325 MG tablet Limit 1 tablet by mouth 2-4 times per day for breakthrough pain while taking MS Contin if tolerated 11/25/15   Mohammed Kindle, MD  morphine (MS CONTIN) 30 MG 12 hr tablet Limit one tab by mouth every  8 - 12  hours if tolerated 11/25/15   Mohammed Kindle, MD  Nutritional Supplements (ESTROVEN NIGHTTIME) TABS Take by mouth as needed. Reported on 10/11/2015    Historical Provider, MD      VITAL SIGNS:  Blood  pressure (!) 122/96, pulse 88, temperature 98.2 F (36.8 C), temperature source Oral, resp. rate 18, height 5\' 8"  (1.727 m), weight 72.6 kg (160 lb), SpO2 97 %.  PHYSICAL EXAMINATION:   Physical Exam  Constitutional: He is oriented to person, place, and time and well-developed, well-nourished, and in no distress. No distress.  HENT:  Head: Normocephalic.  JVD  Eyes: No scleral icterus.  Neck: Normal range of motion. Neck supple. No JVD present. No tracheal deviation present.  Cardiovascular: Normal rate and regular rhythm.  Exam reveals no gallop and no friction rub.   Murmur heard. Pulmonary/Chest: Effort normal. No respiratory distress. He has no wheezes. He has rales. He exhibits no tenderness.  Bilateral crackles  Abdominal: Soft. Bowel sounds are normal. He exhibits distension. He exhibits no mass. There is no tenderness. There is no rebound and no guarding.  Musculoskeletal: Normal range of motion. He exhibits edema.  Neurological: He is alert and oriented to person, place, and time.  Skin: Skin is warm. No rash noted. No erythema.  Psychiatric: Affect and judgment normal.      LABORATORY PANEL:   CBC  Recent Labs Lab 01/11/16 1055  WBC 12.9*  HGB 11.4*  HCT 36.4*  PLT 248   ------------------------------------------------------------------------------------------------------------------  Chemistries   Recent Labs Lab 01/11/16 1055  NA 135  K 4.2  CL 100*  CO2 27  GLUCOSE 98  BUN 32*  CREATININE 1.70*  CALCIUM 9.2   ------------------------------------------------------------------------------------------------------------------  Cardiac Enzymes  Recent Labs Lab 01/11/16 1055  TROPONINI <0.03   ------------------------------------------------------------------------------------------------------------------  RADIOLOGY:  Dg Chest 2 View  Result Date: 01/11/2016 CLINICAL DATA:  Shortness of breath EXAM: CHEST  2 VIEW COMPARISON:  10/29/2015  FINDINGS: Volume loss and pleural thickening on the right. There is scarring in the bases as noted previously. There is no edema, consolidation, effusion, or pneumothorax. Chronic cardiomegaly. Aortic tortuosity. IMPRESSION: 1. No acute finding. 2. Nodular opacity in the left lower lobe by July 2017 chest CT has not been visible by radiography. 3. Chronic right pleural thickening and volume loss. Mild basilar scarring. Electronically Signed   By: Monte Fantasia M.D.   On: 01/11/2016 11:29    EKG:   Normal Sinus rhythm with Q waves in the anterior leads  IMPRESSION AND PLAN:   80 year old male with diastolic heart failure stage II and preserved ejection fraction, moderate to severe stenosis and chronic pain who presents with anasarca.  1. Acute on chronic combined systolic and diastolic heart failure with preserved ejection fraction and moderate to severe stenosis: Last echocardiogram June 2017 Gentle IV diuresis with moderate to severe stenosis. Monitor creatinine. Cardiology consult. Monitor I/O and daily weight.  2. Anasarca: Likely due to problem #1. Order CT the abdomen noncontrast to evaluate for mechanical obstruction causing profound ascites and lower extremity edema.  3. Moderate to severe aortic stenosis which is contributing to #1. Cardiology consult. As per wife he was not  deemed a surgical candidate. Avoid afterload reducing agents  I.E ACEI/ARB, CCB   4. Chronic pain syndrome/spinal stenosis: Continue outpatient regimen. 5. Acute kidney injury: This is due to cardiorenal syndrome. Suspect is we diurese patient creatinine will improve.  6. GERD: Continue PPI.  All the records are reviewed and case discussed with ED provider. Management plans discussed with the patient and he in agreement  CODE STATUS: DNR  TOTAL TIME TAKING CARE OF THIS PATIENT: 55 minutes.    Branton Einstein M.D on 01/11/2016 at 12:12 PM  Between 7am to 6pm - Pager - [phone]  After 6pm go to  [email or web address] - password [email or web address] [facility]  Office  [phone]  CC: Primary care physician; Tracie Harrier, MD

## 2016-01-12 ENCOUNTER — Inpatient Hospital Stay: Payer: Medicare Other

## 2016-01-12 DIAGNOSIS — M48062 Spinal stenosis, lumbar region with neurogenic claudication: Secondary | ICD-10-CM

## 2016-01-12 LAB — BASIC METABOLIC PANEL
Anion gap: 9 (ref 5–15)
BUN: 33 mg/dL — AB (ref 6–20)
CO2: 29 mmol/L (ref 22–32)
Calcium: 9.3 mg/dL (ref 8.9–10.3)
Chloride: 98 mmol/L — ABNORMAL LOW (ref 101–111)
Creatinine, Ser: 1.64 mg/dL — ABNORMAL HIGH (ref 0.61–1.24)
GFR calc Af Amer: 45 mL/min — ABNORMAL LOW (ref >60)
GFR, EST NON AFRICAN AMERICAN: 38 mL/min — AB (ref >60)
GLUCOSE: 85 mg/dL (ref 65–99)
POTASSIUM: 4.2 mmol/L (ref 3.5–5.1)
Sodium: 136 mmol/L (ref 135–145)

## 2016-01-12 MED ORDER — FUROSEMIDE 10 MG/ML IJ SOLN
20.0000 mg | Freq: Three times a day (TID) | INTRAMUSCULAR | Status: DC
  Administered 2016-01-12 – 2016-01-13 (×3): 20 mg via INTRAVENOUS
  Filled 2016-01-12 (×3): qty 2

## 2016-01-12 NOTE — Progress Notes (Signed)
Patient: Nathan Kramer / Admit Date: 01/11/2016 / Date of Encounter: 01/12/2016, 7:34 AM   Subjective: Breathing better this morning. Up standing and walking in his room this morning drinking his coffee. Minus 2.4 L for the admission. Renal function improved from 1.7-->1.64 this morning.   Review of Systems: Review of Systems  Constitutional: Positive for malaise/fatigue. Negative for chills, diaphoresis, fever and weight loss.  HENT: Negative for congestion.   Eyes: Negative for discharge and redness.  Respiratory: Positive for cough and shortness of breath. Negative for hemoptysis, sputum production and wheezing.   Cardiovascular: Positive for orthopnea and leg swelling. Negative for chest pain, palpitations, claudication and PND.  Gastrointestinal: Negative for abdominal pain, heartburn, nausea and vomiting.  Musculoskeletal: Negative for falls and myalgias.  Skin: Negative for rash.  Neurological: Negative for dizziness, tingling, tremors, sensory change, speech change, focal weakness, loss of consciousness and weakness.  Endo/Heme/Allergies: Does not bruise/bleed easily.  Psychiatric/Behavioral: Negative for substance abuse. The patient is not nervous/anxious.   All other systems reviewed and are negative.   Objective: Telemetry: NSR< 70's bpm Physical Exam: Blood pressure 112/63, pulse 69, temperature 97.8 F (36.6 C), resp. rate 15, height 5\' 8"  (1.727 m), weight 161 lb 2.5 oz (73.1 kg), SpO2 98 %. Body mass index is 24.5 kg/m. General: Well developed, well nourished, in no acute distress. Head: Normocephalic, atraumatic, sclera non-icteric, no xanthomas, nares are without discharge. Neck: Negative for carotid bruits. JVP elevated. Lungs: Bilateral crackles along the bases. Breathing is unlabored. Heart: RRR S1 S2, III/VI systolic murmur, no rubs, or gallops.  Abdomen: Soft, non-tender, distended with normoactive bowel sounds. No rebound/guarding. Extremities: No  clubbing or cyanosis. 2+ pitting edema to the thighs bilaterally. Distal pedal pulses are 2+ and equal bilaterally. Neuro: Alert and oriented X 3. Moves all extremities spontaneously. Psych:  Responds to questions appropriately with a normal affect.   Intake/Output Summary (Last 24 hours) at 01/12/16 0734 Last data filed at 01/12/16 0645  Gross per 24 hour  Intake              240 ml  Output             2600 ml  Net            -2360 ml    Inpatient Medications:  . enoxaparin (LOVENOX) injection  40 mg Subcutaneous Q24H  . famotidine  20 mg Oral Daily  . furosemide  20 mg Intravenous Q12H  . morphine  30 mg Oral Q8H  . pantoprazole sodium  40 mg Oral QAC breakfast  . potassium chloride  20 mEq Oral BID  . sodium chloride flush  3 mL Intravenous Q12H   Infusions:    Labs:  Recent Labs  01/11/16 1055 01/12/16 0518  NA 135 136  K 4.2 4.2  CL 100* 98*  CO2 27 29  GLUCOSE 98 85  BUN 32* 33*  CREATININE 1.70* 1.64*  CALCIUM 9.2 9.3    Recent Labs  01/11/16 1055  AST 31  ALT 9*  ALKPHOS 87  BILITOT 1.5*  PROT 7.1  ALBUMIN 4.1    Recent Labs  01/11/16 1055  WBC 12.9*  HGB 11.4*  HCT 36.4*  MCV 60.7*  PLT 248    Recent Labs  01/11/16 1055  TROPONINI <0.03   Invalid input(s): POCBNP No results for input(s): HGBA1C in the last 72 hours.   Weights: Filed Weights   01/11/16 1051 01/11/16 1342 01/12/16 0421  Weight: 160 lb (  72.6 kg) 162 lb 3.2 oz (73.6 kg) 161 lb 2.5 oz (73.1 kg)     Radiology/Studies:  Ct Abdomen Pelvis Wo Contrast  Result Date: 01/11/2016 CLINICAL DATA:  80 year old male with a history of lower extremity edema. EXAM: CT ABDOMEN AND PELVIS WITHOUT CONTRAST TECHNIQUE: Multidetector CT imaging of the abdomen and pelvis was performed following the standard protocol without IV contrast. Patient is position on his right side for comfort COMPARISON:  MR lumbar spine 04/24/2013, CT chest 10/29/2015 FINDINGS: Lower chest:  Calcifications/stents within coronary arteries. Trace pericardial fluid/thickening, unchanged from comparison CT. Calcifications of the aortic valve. Sub solid nodule of the left lower lobe again demonstrated, not significantly changed in size or configuration from the comparison. Right middle lobe sub solid nodule also visualized. Similar appearance of linear scarring/atelectasis at the lung bases. Early bronchiectasis. Trace pleural fluid bilaterally. Hepatobiliary: Unremarkable appearance of liver. Unremarkable appearance of the gallbladder. No intrahepatic or extrahepatic biliary ductal dilatation. Pancreas: Atrophic pancreatic parenchyma. No significant calcifications. Spleen: Greatest diameter of the spleen measures over 17 cm. Adrenals/Urinary Tract: Unremarkable appearance of the adrenal glands. Right: Nonobstructive stone within the right collecting system measuring 7 mm. Stone or a vascular calcification at the inferior right renal cortex measuring 10 mm. No right-sided hydronephrosis. Multiple rounded hypodense lesions of the right kidney cortex, some which are too small to characterize. Left: Small nonobstructive stones versus vascular calcifications at the inferior left collecting system measuring 3 mm - 4 mm. No left hydronephrosis. Multiple rounded low-density cystic lesions on the left. Largest on the anterior lateral cortex measures 5 cm. The smallest are too small to characterize by CT. Stomach/Bowel: Unremarkable appearance of stomach. Inflammatory changes and adjacent to the proximal duodenum. Probable duodenum diverticulum. No abnormally distended small bowel. Normal appendix. No evidence of colonic obstruction. Colonic diverticular disease. No associated inflammatory changes. Mesenteric: Free fluid layered over the dome of the liver, extending into the hilum of the liver and into the right pericolic gutter. Note that the patient is position on is right for this CT. Fluid layered dependently  within the anatomic pelvis. Vascular/Lymphatic: Hazy infiltration of the small bowel mesenteric. Small lymph nodes present. No significantly enlarged lymph nodes. Scattered calcifications of the abdominal aorta.  No aneurysm. Reproductive: Calcifications of the prostate. Other: Small fact containing umbilical hernia. Musculoskeletal: No displaced fracture. Multilevel degenerative changes of the thoracolumbar spine. Bilateral facet disease of the lower lumbar levels. No significant degenerative changes of the hips. IMPRESSION: Compared to CT of the chest 10/21/2015, there has been interval development of small volume free fluid within the upper abdomen and infiltration of the mesentery. The etiology of the free fluid is uncertain, and can be related to generalized edema/ anasarca and a positive fluid balance, although reactive process within the abdomen could also contribute to development of free fluid. If there is concern for acute intra-abdominal process, contrast-enhanced CT may be useful. Cardiomegaly and small bilateral pleural effusions. Splenomegaly. Aortic atherosclerosis. Coronary artery disease. Aortic valve calcifications. Bilateral renal cysts, which are most likely benign though not fully characterize on this noncontrast study. Bilateral nonobstructive nephrolithiasis. Diverticular disease without evidence of acute diverticulitis. Sub solid nodules of the left lower lobe and right middle lobe. As was noted on prior chest CT, a follow-up chest CT between 3 and 6 months (after 10/29/2015) is recommended. Signed, Dulcy Fanny. Earleen Newport, DO [facility] Electronically Signed   By: Corrie Mckusick D.O.   On: 01/11/2016 14:20   Dg Chest  2 View  Result Date: 01/11/2016 CLINICAL DATA:  Shortness of breath EXAM: CHEST  2 VIEW COMPARISON:  10/29/2015 FINDINGS: Volume loss and pleural thickening on the right. There is scarring in the bases as noted previously.  There is no edema, consolidation, effusion, or pneumothorax. Chronic cardiomegaly. Aortic tortuosity. IMPRESSION: 1. No acute finding. 2. Nodular opacity in the left lower lobe by July 2017 chest CT has not been visible by radiography. 3. Chronic right pleural thickening and volume loss. Mild basilar scarring. Electronically Signed   By: Monte Fantasia M.D.   On: 01/11/2016 11:29     Assessment and Plan  Principal Problem:   Acute on chronic diastolic CHF (congestive heart failure), NYHA class 2 (HCC) Active Problems:   Aortic stenosis, moderate   Acute kidney injury superimposed on CKD (HCC)   HTN (hypertension)   Cardiorenal syndrome   CHF exacerbation ([facility])   Dyspnea on exertion   Bilateral leg edema    1. Acute on chronic diastolic CHF/LE swelling/anasarca:  -Improving  -Weight upon admission 160 pounds which is stable compared to most recent clinic weight of 162 pounds on 9/25  -Agree with gentle diuresis with IV Lasix 20 mg bid at this time with KCl repletion  -Has had good UOP to date -No need for repeat echo given recent study  -Consider R/LHC as below s/p diuresis, could be done as an outpatient  -Abdominal CT non-acute -Daily weights and I&O   2. Acute on CKD stage II:  -Stable -Monitor closely with diuresis  -Renal function may improve with diuresis   3. Moderate aortic stenosis:  -Family has previously not wanted any invasive testing -Now open to [facility] as above -Outpatient follow up -No intervention needed at this time  4. HTN: -Controlled -Continue current medications  Signed, Marcille Blanco [facility] Pager: [phone] 01/12/2016, 7:34 AM

## 2016-01-12 NOTE — Progress Notes (Signed)
[facility] Failure Clinic appointment on January 20, 2016 at 11:00am with Darylene Price, [place]. Please call [phone] to reschedule.

## 2016-01-12 NOTE — Progress Notes (Signed)
[facility] NAME: Nathan Kramer    MR#:  [id number]  DATE OF BIRTH:  1935-12-03  SUBJECTIVE:  CHIEF COMPLAINT:   Chief Complaint  Patient presents with  . Shortness of Breath  . Leg Swelling   SOB and swelling better. Has chronic orthopnea  On high-dose narcotics for his chronic arthritis pain. Pain is to same.  REVIEW OF SYSTEMS:    Review of Systems  Constitutional: Positive for malaise/fatigue. Negative for chills and fever.  HENT: Negative for sore throat.   Eyes: Negative for blurred vision, double vision and pain.  Respiratory: Positive for shortness of breath. Negative for cough, hemoptysis and wheezing.   Cardiovascular: Positive for orthopnea and leg swelling. Negative for chest pain and palpitations.  Gastrointestinal: Negative for abdominal pain, constipation, diarrhea, heartburn, nausea and vomiting.  Genitourinary: Negative for dysuria and hematuria.  Musculoskeletal: Positive for joint pain. Negative for back pain.  Skin: Negative for rash.  Neurological: Positive for weakness. Negative for sensory change, speech change, focal weakness and headaches.  Endo/Heme/Allergies: Does not bruise/bleed easily.  Psychiatric/Behavioral: Negative for depression. The patient is not nervous/anxious.     DRUG ALLERGIES:   Allergies  Allergen Reactions  . Amoxicillin     Other reaction(s): Localized superficial swelling of skin Face swelling   . Reglan [Metoclopramide]     Involuntary movements body    VITALS:  Blood pressure (!) 115/50, pulse 73, temperature 97.8 F (36.6 C), temperature source Oral, resp. rate 18, height 5\' 8"  (1.727 m), weight 73.1 kg (161 lb 2.5 oz), SpO2 100 %.  PHYSICAL EXAMINATION:   Physical Exam  GENERAL:  80 y.o.-year-old patient lying in the bed with no acute distress.  EYES: Pupils equal, round, reactive to light and accommodation. No scleral icterus. Extraocular muscles  intact.  HEENT: Head atraumatic, normocephalic. Oropharynx and nasopharynx clear.  NECK:  Supple, no jugular venous distention. No thyroid enlargement, no tenderness.  LUNGS: , no wheezing, rhonchi. No use of accessory muscles of respiration. Crackles b/l CARDIOVASCULAR: S1, S2 normal. No murmurs, rubs, or gallops.  ABDOMEN: Soft, nontender, nondistended. Bowel sounds present. No organomegaly or mass.  EXTREMITIES: No cyanosis, clubbing. 2+ LE edema    NEUROLOGIC: Cranial nerves II through XII are intact. No focal Motor or sensory deficits b/l.   PSYCHIATRIC: The patient is alert and oriented x 3.  SKIN: No obvious rash, lesion, or ulcer.   LABORATORY PANEL:   CBC  Recent Labs Lab 01/11/16 1055  WBC 12.9*  HGB 11.4*  HCT 36.4*  PLT 248   ------------------------------------------------------------------------------------------------------------------ Chemistries   Recent Labs Lab 01/11/16 1055 01/12/16 0518  NA 135 136  K 4.2 4.2  CL 100* 98*  CO2 27 29  GLUCOSE 98 85  BUN 32* 33*  CREATININE 1.70* 1.64*  CALCIUM 9.2 9.3  AST 31  --   ALT 9*  --   ALKPHOS 87  --   BILITOT 1.5*  --    ------------------------------------------------------------------------------------------------------------------  Cardiac Enzymes  Recent Labs Lab 01/11/16 1055  TROPONINI <0.03   ------------------------------------------------------------------------------------------------------------------  RADIOLOGY:  Ct Abdomen Pelvis Wo Contrast  Result Date: 01/11/2016 CLINICAL DATA:  80 year old male with a history of lower extremity edema. EXAM: CT ABDOMEN AND PELVIS WITHOUT CONTRAST TECHNIQUE: Multidetector CT imaging of the abdomen and pelvis was performed following the standard protocol without IV contrast. Patient is position on his right side for comfort COMPARISON:  MR lumbar spine 04/24/2013, CT chest 10/29/2015 FINDINGS: Lower  chest: Calcifications/stents within coronary  arteries. Trace pericardial fluid/thickening, unchanged from comparison CT. Calcifications of the aortic valve. Sub solid nodule of the left lower lobe again demonstrated, not significantly changed in size or configuration from the comparison. Right middle lobe sub solid nodule also visualized. Similar appearance of linear scarring/atelectasis at the lung bases. Early bronchiectasis. Trace pleural fluid bilaterally. Hepatobiliary: Unremarkable appearance of liver. Unremarkable appearance of the gallbladder. No intrahepatic or extrahepatic biliary ductal dilatation. Pancreas: Atrophic pancreatic parenchyma. No significant calcifications. Spleen: Greatest diameter of the spleen measures over 17 cm. Adrenals/Urinary Tract: Unremarkable appearance of the adrenal glands. Right: Nonobstructive stone within the right collecting system measuring 7 mm. Stone or a vascular calcification at the inferior right renal cortex measuring 10 mm. No right-sided hydronephrosis. Multiple rounded hypodense lesions of the right kidney cortex, some which are too small to characterize. Left: Small nonobstructive stones versus vascular calcifications at the inferior left collecting system measuring 3 mm - 4 mm. No left hydronephrosis. Multiple rounded low-density cystic lesions on the left. Largest on the anterior lateral cortex measures 5 cm. The smallest are too small to characterize by CT. Stomach/Bowel: Unremarkable appearance of stomach. Inflammatory changes and adjacent to the proximal duodenum. Probable duodenum diverticulum. No abnormally distended small bowel. Normal appendix. No evidence of colonic obstruction. Colonic diverticular disease. No associated inflammatory changes. Mesenteric: Free fluid layered over the dome of the liver, extending into the hilum of the liver and into the right pericolic gutter. Note that the patient is position on is right for this CT. Fluid layered dependently within the anatomic pelvis.  Vascular/Lymphatic: Hazy infiltration of the small bowel mesenteric. Small lymph nodes present. No significantly enlarged lymph nodes. Scattered calcifications of the abdominal aorta.  No aneurysm. Reproductive: Calcifications of the prostate. Other: Small fact containing umbilical hernia. Musculoskeletal: No displaced fracture. Multilevel degenerative changes of the thoracolumbar spine. Bilateral facet disease of the lower lumbar levels. No significant degenerative changes of the hips. IMPRESSION: Compared to CT of the chest 10/21/2015, there has been interval development of small volume free fluid within the upper abdomen and infiltration of the mesentery. The etiology of the free fluid is uncertain, and can be related to generalized edema/ anasarca and a positive fluid balance, although reactive process within the abdomen could also contribute to development of free fluid. If there is concern for acute intra-abdominal process, contrast-enhanced CT may be useful. Cardiomegaly and small bilateral pleural effusions. Splenomegaly. Aortic atherosclerosis. Coronary artery disease. Aortic valve calcifications. Bilateral renal cysts, which are most likely benign though not fully characterize on this noncontrast study. Bilateral nonobstructive nephrolithiasis. Diverticular disease without evidence of acute diverticulitis. Sub solid nodules of the left lower lobe and right middle lobe. As was noted on prior chest CT, a follow-up chest CT between 3 and 6 months (after 10/29/2015) is recommended. Signed, Dulcy Fanny. Earleen Newport, DO [facility] Electronically Signed   By: Corrie Mckusick D.O.   On: 01/11/2016 14:20   Dg Chest 2 View  Result Date: 01/12/2016 CLINICAL DATA:  Acute CHF, anasarca, CKD, moderate aortic stenosis, HTN, SOB, hx reflux esophagitis, stroke, cardiac murmurs, smoker, no prev surg EXAM: CHEST  2 VIEW COMPARISON:  01/11/2016 FINDINGS: Cardiac silhouette  is mildly enlarged. There is a small hiatal hernia. No mediastinal or hilar masses or convincing adenopathy. There is elevation right hemidiaphragm. Linear opacities noted at the right lung base consistent with scarring or atelectasis. Minor scar or atelectasis is noted at the left lateral lung  base. There is a focal area of pleural thickening along the right lateral mid hemi thorax. Remainder of the lungs is clear. No convincing pleural effusion. No pneumothorax. Skeletal structures are demineralized but grossly intact. IMPRESSION: No acute cardiopulmonary disease. No change from the prior radiographs. Electronically Signed   By: Lajean Manes M.D.   On: 01/12/2016 09:28   Dg Chest 2 View  Result Date: 01/11/2016 CLINICAL DATA:  Shortness of breath EXAM: CHEST  2 VIEW COMPARISON:  10/29/2015 FINDINGS: Volume loss and pleural thickening on the right. There is scarring in the bases as noted previously. There is no edema, consolidation, effusion, or pneumothorax. Chronic cardiomegaly. Aortic tortuosity. IMPRESSION: 1. No acute finding. 2. Nodular opacity in the left lower lobe by July 2017 chest CT has not been visible by radiography. 3. Chronic right pleural thickening and volume loss. Mild basilar scarring. Electronically Signed   By: Monte Fantasia M.D.   On: 01/11/2016 11:29     ASSESSMENT AND PLAN:   80 year old male with diastolic heart failure stage II and preserved ejection fraction, moderate to severe stenosis and chronic pain who presents with anasarca.  1. Acute on chronic combined systolic and diastolic heart failure with preserved ejection fraction and moderate to severe stenosis Last echocardiogram June 2017 Continue Lasix 40mg  IV BID Monitor creatinine. Appreciate cardiology input Monitor I/O and daily weight. Anasarca improving CT abd checked and showed some ascitis  3. Moderate aortic stenosis F/u as OP   4. Chronic pain syndrome/spinal stenosis: Continue outpatient  regimen.  5. CKD3 Monitor  6. GERD: Continue PPI.  All the records are reviewed and case discussed with Care Management/Social Workerr. Management plans discussed with the patient, family and they are in agreement.  CODE STATUS: FULL CODE  DVT Prophylaxis: SCDs  TOTAL TIME TAKING CARE OF THIS PATIENT: 35 minutes.   POSSIBLE D/C IN 1-2 DAYS, DEPENDING ON CLINICAL CONDITION.  Hillary Bow R M.D on 01/12/2016 at 1:03 PM  Between 7am to 6pm - Pager - [phone]  After 6pm go to [email or web address] - password [email or web address] [facility]  Office  [phone]  CC: Primary care physician; Tracie Harrier, MD  Note: This dictation was prepared with Dragon dictation along with smaller phrase technology. Any transcriptional errors that result from this process are unintentional.

## 2016-01-12 NOTE — Discharge Instructions (Signed)
Heart Failure Clinic appointment on January 20, 2016 at 11:00am with Darylene Price, [place]. Please call [phone] to reschedule.

## 2016-01-12 NOTE — Evaluation (Signed)
Physical Therapy Evaluation Patient Details Name: Nathan Kramer MRN: [id number] DOB: 04-Jul-1935 Today's Date: 01/12/2016   History of Present Illness  Pt admitted for acute on chronic CHF. Pt with history of anemia, prostate cancer, and CVA. Pt with complaints of SOB and B LE edema.   Clinical Impression  Pt is a pleasant 80 year old male who was admitted for acute on chronic CHF. Pt performs transfers and ambulation with supervision and no AD. Pt is currently at baseline level. Occasional pain in L toes with walking. Therapist demonstrated stretching to relieve pain. Pt does not require any further PT needs at this time. Pt will be dc in house and does not require follow up. RN aware. Will dc current orders.      Follow Up Recommendations No PT follow up    Equipment Recommendations       Recommendations for Other Services       Precautions / Restrictions Precautions Precautions: Fall Restrictions Weight Bearing Restrictions: No      Mobility  Bed Mobility               General bed mobility comments: not performed as pt received in recliner  Transfers Overall transfer level: Needs assistance Equipment used: None Transfers: Sit to/from Stand Sit to Stand: Supervision         General transfer comment: safe technique performed pushing from seated surface. Once standing, able to stand with independence  Ambulation/Gait Ambulation/Gait assistance: Supervision Ambulation Distance (Feet): 50 Feet Assistive device: None Gait Pattern/deviations: Step-through pattern     General Gait Details: ambulated using no AD with reciprocal gait pattern. Pt able to demonstrate upright posture.   Stairs            Wheelchair Mobility    Modified Rankin (Stroke Patients Only)       Balance Overall balance assessment: Modified Independent                               Standardized Balance Assessment Standardized Balance Assessment : Dynamic  Gait Index   Dynamic Gait Index Level Surface: Normal Change in Gait Speed: Normal Gait with Horizontal Head Turns: Normal Gait with Vertical Head Turns: Normal       Pertinent Vitals/Pain Pain Assessment: 0-10 Pain Score: 2  Pain Location: R Toe Pain Descriptors / Indicators: Shooting;Sore Pain Intervention(s): Limited activity within patient's tolerance    Home Living Family/patient expects to be discharged to:: Private residence Living Arrangements: Spouse/significant other Available Help at Discharge: Available 24 hours/day Type of Home: House Home Access: Stairs to enter Entrance Stairs-Rails: Can reach both Entrance Stairs-Number of Steps: 3 Home Layout: One level        Prior Function Level of Independence: Independent               Hand Dominance        Extremity/Trunk Assessment   Upper Extremity Assessment: Overall WFL for tasks assessed           Lower Extremity Assessment: Overall WFL for tasks assessed         Communication   Communication: No difficulties  Cognition Arousal/Alertness: Awake/alert Behavior During Therapy: WFL for tasks assessed/performed Overall Cognitive Status: Within Functional Limits for tasks assessed                      General Comments      Exercises Other Exercises Other Exercises: Pt  ambulated in hallway with no AD and supervision. Pt able to complete modified DGI with safe technique. No LOB noted and good gait speed noted. Pt ambulated an additional 160'. Reciprocal gait pattern performed.   Assessment/Plan    PT Assessment Patent does not need any further PT services  PT Problem List            PT Treatment Interventions      PT Goals (Current goals can be found in the Care Plan section)  Acute Rehab PT Goals Patient Stated Goal: to get back to normal PT Goal Formulation: All assessment and education complete, DC therapy Time For Goal Achievement: 02-01-16 Potential to Achieve Goals:  Good    Frequency     Barriers to discharge        Co-evaluation               End of Session Equipment Utilized During Treatment: Gait belt Activity Tolerance: Patient tolerated treatment well Patient left: in chair Nurse Communication: Mobility status         Time: [id number] PT Time Calculation (min) (ACUTE ONLY): 14 min   Charges:   PT Evaluation $PT Eval Low Complexity: 1 Procedure PT Treatments $Gait Training: 8-22 mins   PT G Codes:        Deyna Carbon 01-Feb-2016, 3:53 PM  Greggory Stallion, PT, DPT [phone]

## 2016-01-12 NOTE — Plan of Care (Signed)
Problem: Food- and Nutrition-Related Knowledge Deficit (NB-1.1) Goal: Nutrition education Formal process to instruct or train a patient/client in a skill or to impart knowledge to help patients/clients voluntarily manage or modify food choices and eating behavior to maintain or improve health. Outcome: Completed/Met Date Met: 01/12/16 Nutrition Education Note  RD consulted for nutrition education regarding new onset CHF.  RD provided "Heart Failure Nutrition Therapy" handout from the [employer]. Wife at bedside, wife primarily does grocery shopping and cooking. Reviewed patient's dietary recall. Provided examples on ways to decrease sodium intake in diet. Discouraged intake of processed foods and use of salt shaker. Encouraged fresh fruits and vegetables as well as whole grain sources of carbohydrates to maximize fiber intake.   RD discussed why it is important for patient to adhere to diet recommendations, and emphasized the role of fluids, foods to avoid, and importance of weighing self daily. Teach back method used.  Expect good compliance.  Body mass index is 24.5 kg/m.   Current diet order is Heart Healthy, patient is consuming approximately 100% of meals at this time, appetite very good. Labs and medications reviewed. No further nutrition interventions warranted at this time. RD contact information provided. If additional nutrition issues arise, please re-consult RD.   Kerman Passey [place], [place], LDN [phone] Pager  [phone] Weekend/On-Call Pager

## 2016-01-12 NOTE — Care Management (Signed)
Patient presents from home with shortness of breath.  Admitted with acute on chronic heart failure.  Also has significant aortic stenosis.  Has access to scales so can weigh daily and family member is a Marine scientist  for close monitoring.  Patient will be receiving heart failure education booklet.  He is independent with all his adls, no issues accessing medical care or paying for meds.  Referral has been made to heart failure clinic.  Patient does not feel he meets homebound criteria in order to receive home health services.  Patient is currently ambulating around the room without any issues or appearance of dyspnea

## 2016-01-13 LAB — BASIC METABOLIC PANEL
Anion gap: 7 (ref 5–15)
BUN: 34 mg/dL — ABNORMAL HIGH (ref 6–20)
CHLORIDE: 98 mmol/L — AB (ref 101–111)
CO2: 31 mmol/L (ref 22–32)
CREATININE: 1.73 mg/dL — AB (ref 0.61–1.24)
Calcium: 9.1 mg/dL (ref 8.9–10.3)
GFR, EST AFRICAN AMERICAN: 42 mL/min — AB (ref >60)
GFR, EST NON AFRICAN AMERICAN: 36 mL/min — AB (ref >60)
Glucose, Bld: 95 mg/dL (ref 65–99)
POTASSIUM: 4.2 mmol/L (ref 3.5–5.1)
SODIUM: 136 mmol/L (ref 135–145)

## 2016-01-13 MED ORDER — FUROSEMIDE 10 MG/ML IJ SOLN: 20.0000 mg | Freq: Two times a day (BID) | INTRAMUSCULAR | Status: DC

## 2016-01-13 MED ORDER — FUROSEMIDE 10 MG/ML IJ SOLN
40.0000 mg | Freq: Two times a day (BID) | INTRAMUSCULAR | Status: DC
  Administered 2016-01-13 – 2016-01-14 (×2): 40 mg via INTRAVENOUS
  Filled 2016-01-13 (×2): qty 4

## 2016-01-13 NOTE — Progress Notes (Signed)
Patient: SHAHID AMBURN / Admit Date: 01/11/2016 / Date of Encounter: 01/13/2016, 8:41 AM   Subjective: Breathing better. Minus 3.4 L for the admission and 1.7 L for the past 24 hours. Renal function slightly bumped today. Continued LE swelling.   Review of Systems: Review of Systems  Constitutional: Positive for malaise/fatigue. Negative for chills, diaphoresis, fever and weight loss.  HENT: Negative for congestion.   Eyes: Negative for discharge and redness.  Respiratory: Positive for cough and shortness of breath. Negative for sputum production and wheezing.   Cardiovascular: Positive for orthopnea and leg swelling. Negative for chest pain, palpitations, claudication and PND.  Gastrointestinal: Negative for abdominal pain, heartburn, nausea and vomiting.  Musculoskeletal: Negative for falls and myalgias.  Skin: Negative for rash.  Neurological: Positive for weakness. Negative for dizziness, tingling, tremors, sensory change, speech change, focal weakness and loss of consciousness.  Endo/Heme/Allergies: Does not bruise/bleed easily.  Psychiatric/Behavioral: Negative for substance abuse. The patient is not nervous/anxious.   All other systems reviewed and are negative.   Objective: Telemetry: NSE, 80's bpm Physical Exam: Blood pressure 120/62, pulse 81, temperature 98.2 F (36.8 C), temperature source Oral, resp. rate 18, height 5\' 8"  (1.727 m), weight 159 lb 12.8 oz (72.5 kg), SpO2 96 %. Body mass index is 24.3 kg/m. General: Well developed, well nourished, in no acute distress. Head: Normocephalic, atraumatic, sclera non-icteric, no xanthomas, nares are without discharge. Neck: Negative for carotid bruits. JVP mildly elevated. Lungs: Bilateral crackles along the bases. Breathing is unlabored. Heart: RRR S1 S2, III/VI systolic murmur, no rubs, or gallops.  Abdomen: Soft, non-tender, non-distended with normoactive bowel sounds. No rebound/guarding. Extremities: No clubbing  or cyanosis. 2+ edema. Distal pedal pulses are 2+ and equal bilaterally. Neuro: Alert and oriented X 3. Moves all extremities spontaneously. Psych:  Responds to questions appropriately with a normal affect.   Intake/Output Summary (Last 24 hours) at 01/13/16 0841 Last data filed at 01/13/16 0609  Gross per 24 hour  Intake              603 ml  Output             1700 ml  Net            -1097 ml    Inpatient Medications:  . enoxaparin (LOVENOX) injection  40 mg Subcutaneous Q24H  . famotidine  20 mg Oral Daily  . furosemide  20 mg Intravenous Q8H  . morphine  30 mg Oral Q8H  . pantoprazole sodium  40 mg Oral QAC breakfast  . potassium chloride  20 mEq Oral BID  . sodium chloride flush  3 mL Intravenous Q12H   Infusions:    Labs:  Recent Labs  01/12/16 0518 01/13/16 0501  NA 136 136  K 4.2 4.2  CL 98* 98*  CO2 29 31  GLUCOSE 85 95  BUN 33* 34*  CREATININE 1.64* 1.73*  CALCIUM 9.3 9.1    Recent Labs  01/11/16 1055  AST 31  ALT 9*  ALKPHOS 87  BILITOT 1.5*  PROT 7.1  ALBUMIN 4.1    Recent Labs  01/11/16 1055  WBC 12.9*  HGB 11.4*  HCT 36.4*  MCV 60.7*  PLT 248    Recent Labs  01/11/16 1055  TROPONINI <0.03   Invalid input(s): POCBNP No results for input(s): HGBA1C in the last 72 hours.   Weights: Filed Weights   01/11/16 1342 01/12/16 0421 01/13/16 0356  Weight: 162 lb 3.2 oz (73.6 kg) 161  lb 2.5 oz (73.1 kg) 159 lb 12.8 oz (72.5 kg)     Radiology/Studies:  Ct Abdomen Pelvis Wo Contrast  Result Date: 01/11/2016 CLINICAL DATA:  80 year old male with a history of lower extremity edema. EXAM: CT ABDOMEN AND PELVIS WITHOUT CONTRAST TECHNIQUE: Multidetector CT imaging of the abdomen and pelvis was performed following the standard protocol without IV contrast. Patient is position on his right side for comfort COMPARISON:  MR lumbar spine 04/24/2013, CT chest 10/29/2015 FINDINGS: Lower chest: Calcifications/stents within coronary arteries. Trace  pericardial fluid/thickening, unchanged from comparison CT. Calcifications of the aortic valve. Sub solid nodule of the left lower lobe again demonstrated, not significantly changed in size or configuration from the comparison. Right middle lobe sub solid nodule also visualized. Similar appearance of linear scarring/atelectasis at the lung bases. Early bronchiectasis. Trace pleural fluid bilaterally. Hepatobiliary: Unremarkable appearance of liver. Unremarkable appearance of the gallbladder. No intrahepatic or extrahepatic biliary ductal dilatation. Pancreas: Atrophic pancreatic parenchyma. No significant calcifications. Spleen: Greatest diameter of the spleen measures over 17 cm. Adrenals/Urinary Tract: Unremarkable appearance of the adrenal glands. Right: Nonobstructive stone within the right collecting system measuring 7 mm. Stone or a vascular calcification at the inferior right renal cortex measuring 10 mm. No right-sided hydronephrosis. Multiple rounded hypodense lesions of the right kidney cortex, some which are too small to characterize. Left: Small nonobstructive stones versus vascular calcifications at the inferior left collecting system measuring 3 mm - 4 mm. No left hydronephrosis. Multiple rounded low-density cystic lesions on the left. Largest on the anterior lateral cortex measures 5 cm. The smallest are too small to characterize by CT. Stomach/Bowel: Unremarkable appearance of stomach. Inflammatory changes and adjacent to the proximal duodenum. Probable duodenum diverticulum. No abnormally distended small bowel. Normal appendix. No evidence of colonic obstruction. Colonic diverticular disease. No associated inflammatory changes. Mesenteric: Free fluid layered over the dome of the liver, extending into the hilum of the liver and into the right pericolic gutter. Note that the patient is position on is right for this CT. Fluid layered dependently within the anatomic pelvis. Vascular/Lymphatic: Hazy  infiltration of the small bowel mesenteric. Small lymph nodes present. No significantly enlarged lymph nodes. Scattered calcifications of the abdominal aorta.  No aneurysm. Reproductive: Calcifications of the prostate. Other: Small fact containing umbilical hernia. Musculoskeletal: No displaced fracture. Multilevel degenerative changes of the thoracolumbar spine. Bilateral facet disease of the lower lumbar levels. No significant degenerative changes of the hips. IMPRESSION: Compared to CT of the chest 10/21/2015, there has been interval development of small volume free fluid within the upper abdomen and infiltration of the mesentery. The etiology of the free fluid is uncertain, and can be related to generalized edema/ anasarca and a positive fluid balance, although reactive process within the abdomen could also contribute to development of free fluid. If there is concern for acute intra-abdominal process, contrast-enhanced CT may be useful. Cardiomegaly and small bilateral pleural effusions. Splenomegaly. Aortic atherosclerosis. Coronary artery disease. Aortic valve calcifications. Bilateral renal cysts, which are most likely benign though not fully characterize on this noncontrast study. Bilateral nonobstructive nephrolithiasis. Diverticular disease without evidence of acute diverticulitis. Sub solid nodules of the left lower lobe and right middle lobe. As was noted on prior chest CT, a follow-up chest CT between 3 and 6 months (after 10/29/2015) is recommended. Signed, Dulcy Fanny. Earleen Newport, DO [facility] Electronically Signed   By: Corrie Mckusick D.O.   On: 01/11/2016 14:20   Dg Chest 2 View  Result Date: 01/12/2016 CLINICAL DATA:  Acute CHF, anasarca, CKD, moderate aortic stenosis, HTN, SOB, hx reflux esophagitis, stroke, cardiac murmurs, smoker, no prev surg EXAM: CHEST  2 VIEW COMPARISON:  01/11/2016 FINDINGS: Cardiac silhouette is mildly enlarged.  There is a small hiatal hernia. No mediastinal or hilar masses or convincing adenopathy. There is elevation right hemidiaphragm. Linear opacities noted at the right lung base consistent with scarring or atelectasis. Minor scar or atelectasis is noted at the left lateral lung base. There is a focal area of pleural thickening along the right lateral mid hemi thorax. Remainder of the lungs is clear. No convincing pleural effusion. No pneumothorax. Skeletal structures are demineralized but grossly intact. IMPRESSION: No acute cardiopulmonary disease. No change from the prior radiographs. Electronically Signed   By: Lajean Manes M.D.   On: 01/12/2016 09:28   Dg Chest 2 View  Result Date: 01/11/2016 CLINICAL DATA:  Shortness of breath EXAM: CHEST  2 VIEW COMPARISON:  10/29/2015 FINDINGS: Volume loss and pleural thickening on the right. There is scarring in the bases as noted previously. There is no edema, consolidation, effusion, or pneumothorax. Chronic cardiomegaly. Aortic tortuosity. IMPRESSION: 1. No acute finding. 2. Nodular opacity in the left lower lobe by July 2017 chest CT has not been visible by radiography. 3. Chronic right pleural thickening and volume loss. Mild basilar scarring. Electronically Signed   By: Monte Fantasia M.D.   On: 01/11/2016 11:29     Assessment and Plan  Principal Problem:   Acute on chronic diastolic CHF (congestive heart failure), NYHA class 2 (HCC) Active Problems:   Aortic stenosis, moderate   Acute kidney injury superimposed on CKD (HCC)   HTN (hypertension)   Cardiorenal syndrome   CHF exacerbation (HCC)   Dyspnea on exertion   Bilateral leg edema    1. Acute on chronic diastolic CHF/LE swelling/anasarca:  -Improving  -Weight upon admission 160 pounds which is stable compared to most recent clinic weight of 162 pounds on 9/25  -Remains volume overloaded -Discussed with Dr. Rockey Situ, MD. Will increase Lasix to 40 mg bid in an effort to increase UOP -Has had  good UOP to date -No need for repeat echo given recent study  -Consider R/LHC as below s/p diuresis, could be done as an outpatient  -Abdominal CT non-acute -Daily weights and I&O   2. Acute on CKD stage II:  -Stable -Monitor closely with diuresis  -Renal function may improve with diuresis   3. Moderate aortic stenosis:  -Family has previously not wanted any invasive testing -Now open to [facility] as above -Outpatient follow up -No intervention needed at this time  4. HTN: -Controlled -Continue current medications  Signed, Marcille Blanco [facility] Pager: [phone] 01/13/2016, 8:41 AM

## 2016-01-13 NOTE — Progress Notes (Addendum)
[facility] [place] NAME: Nathan Kramer    MR#:  [id number]  DATE OF BIRTH:  [birth year]  SUBJECTIVE:   Patient is a very good mood this morning. He is diuresing fairly well. He feels his shortness of breath is improved.  REVIEW OF SYSTEMS:    Review of Systems  Constitutional: Negative.  Negative for chills, fever and malaise/fatigue.  HENT: Negative.  Negative for ear discharge, ear pain, hearing loss, nosebleeds and sore throat.   Eyes: Negative.  Negative for blurred vision and pain.  Respiratory: Negative.  Negative for cough, hemoptysis, shortness of breath and wheezing.   Cardiovascular: Positive for leg swelling. Negative for chest pain and palpitations.  Gastrointestinal: Negative.  Negative for abdominal pain, blood in stool, diarrhea, nausea and vomiting.  Genitourinary: Negative.  Negative for dysuria.  Musculoskeletal: Negative.  Negative for back pain.  Skin: Negative.   Neurological: Negative for dizziness, tremors, speech change, focal weakness, seizures and headaches.  Endo/Heme/Allergies: Negative.  Does not bruise/bleed easily.  Psychiatric/Behavioral: Negative.  Negative for depression, hallucinations and suicidal ideas.    Tolerating Diet: yes      DRUG ALLERGIES:   Allergies  Allergen Reactions  . Amoxicillin     Other reaction(s): Localized superficial swelling of skin Face swelling   . Reglan [Metoclopramide]     Involuntary movements body    VITALS:  Blood pressure 118/61, pulse 73, temperature 97.6 F (36.4 C), temperature source Oral, resp. rate 18, height 5\' 8"  (1.727 m), weight 72.5 kg (159 lb 12.8 oz), SpO2 98 %.  PHYSICAL EXAMINATION:   Physical Exam  Constitutional: He is oriented to person, place, and time and well-developed, well-nourished, and in no distress. No distress.  HENT:  Head: Normocephalic.  Eyes: No scleral icterus.  Neck: Normal range of motion. Neck supple. No JVD present.  No tracheal deviation present.  Cardiovascular: Normal rate and regular rhythm.  Exam reveals no gallop and no friction rub.   Murmur heard. Pulmonary/Chest: Effort normal and breath sounds normal. No respiratory distress. He has no wheezes. He has no rales. He exhibits no tenderness.  Abdominal: Soft. Bowel sounds are normal. He exhibits no distension and no mass. There is no tenderness. There is no rebound and no guarding.  Musculoskeletal: Normal range of motion. He exhibits edema.  Neurological: He is alert and oriented to person, place, and time.  Skin: Skin is warm. No rash noted. No erythema.  Psychiatric: Affect and judgment normal.      LABORATORY PANEL:   CBC  Recent Labs Lab 01/11/16 1055  WBC 12.9*  HGB 11.4*  HCT 36.4*  PLT 248   ------------------------------------------------------------------------------------------------------------------  Chemistries   Recent Labs Lab 01/11/16 1055  01/13/16 0501  NA 135  < > 136  K 4.2  < > 4.2  CL 100*  < > 98*  CO2 27  < > 31  GLUCOSE 98  < > 95  BUN 32*  < > 34*  CREATININE 1.70*  < > 1.73*  CALCIUM 9.2  < > 9.1  AST 31  --   --   ALT 9*  --   --   ALKPHOS 87  --   --   BILITOT 1.5*  --   --   < > = values in this interval not displayed. ------------------------------------------------------------------------------------------------------------------  Cardiac Enzymes  Recent Labs Lab 01/11/16 1055  TROPONINI <0.03   ------------------------------------------------------------------------------------------------------------------  RADIOLOGY:  Ct Abdomen Pelvis Wo Contrast  Result  Date: 01/11/2016 CLINICAL DATA:  80 year old male with a history of lower extremity edema. EXAM: CT ABDOMEN AND PELVIS WITHOUT CONTRAST TECHNIQUE: Multidetector CT imaging of the abdomen and pelvis was performed following the standard protocol without IV contrast. Patient is position on his right side for comfort COMPARISON:   MR lumbar spine 04/24/2013, CT chest 10/29/2015 FINDINGS: Lower chest: Calcifications/stents within coronary arteries. Trace pericardial fluid/thickening, unchanged from comparison CT. Calcifications of the aortic valve. Sub solid nodule of the left lower lobe again demonstrated, not significantly changed in size or configuration from the comparison. Right middle lobe sub solid nodule also visualized. Similar appearance of linear scarring/atelectasis at the lung bases. Early bronchiectasis. Trace pleural fluid bilaterally. Hepatobiliary: Unremarkable appearance of liver. Unremarkable appearance of the gallbladder. No intrahepatic or extrahepatic biliary ductal dilatation. Pancreas: Atrophic pancreatic parenchyma. No significant calcifications. Spleen: Greatest diameter of the spleen measures over 17 cm. Adrenals/Urinary Tract: Unremarkable appearance of the adrenal glands. Right: Nonobstructive stone within the right collecting system measuring 7 mm. Stone or a vascular calcification at the inferior right renal cortex measuring 10 mm. No right-sided hydronephrosis. Multiple rounded hypodense lesions of the right kidney cortex, some which are too small to characterize. Left: Small nonobstructive stones versus vascular calcifications at the inferior left collecting system measuring 3 mm - 4 mm. No left hydronephrosis. Multiple rounded low-density cystic lesions on the left. Largest on the anterior lateral cortex measures 5 cm. The smallest are too small to characterize by CT. Stomach/Bowel: Unremarkable appearance of stomach. Inflammatory changes and adjacent to the proximal duodenum. Probable duodenum diverticulum. No abnormally distended small bowel. Normal appendix. No evidence of colonic obstruction. Colonic diverticular disease. No associated inflammatory changes. Mesenteric: Free fluid layered over the dome of the liver, extending into the hilum of the liver and into the right pericolic gutter. Note that the  patient is position on is right for this CT. Fluid layered dependently within the anatomic pelvis. Vascular/Lymphatic: Hazy infiltration of the small bowel mesenteric. Small lymph nodes present. No significantly enlarged lymph nodes. Scattered calcifications of the abdominal aorta.  No aneurysm. Reproductive: Calcifications of the prostate. Other: Small fact containing umbilical hernia. Musculoskeletal: No displaced fracture. Multilevel degenerative changes of the thoracolumbar spine. Bilateral facet disease of the lower lumbar levels. No significant degenerative changes of the hips. IMPRESSION: Compared to CT of the chest 10/21/2015, there has been interval development of small volume free fluid within the upper abdomen and infiltration of the mesentery. The etiology of the free fluid is uncertain, and can be related to generalized edema/ anasarca and a positive fluid balance, although reactive process within the abdomen could also contribute to development of free fluid. If there is concern for acute intra-abdominal process, contrast-enhanced CT may be useful. Cardiomegaly and small bilateral pleural effusions. Splenomegaly. Aortic atherosclerosis. Coronary artery disease. Aortic valve calcifications. Bilateral renal cysts, which are most likely benign though not fully characterize on this noncontrast study. Bilateral nonobstructive nephrolithiasis. Diverticular disease without evidence of acute diverticulitis. Sub solid nodules of the left lower lobe and right middle lobe. As was noted on prior chest CT, a follow-up chest CT between 3 and 6 months (after 10/29/2015) is recommended. Signed, Dulcy Fanny. Earleen Newport, DO [facility] Radiology Electronically Signed   By: Corrie Mckusick D.O.   On: 01/11/2016 14:20   Dg Chest 2 View  Result Date: 01/12/2016 CLINICAL DATA:  Acute CHF, anasarca, CKD, moderate aortic stenosis, HTN, SOB, hx reflux esophagitis, stroke, cardiac  murmurs, smoker, no  prev surg EXAM: CHEST  2 VIEW COMPARISON:  01/11/2016 FINDINGS: Cardiac silhouette is mildly enlarged. There is a small hiatal hernia. No mediastinal or hilar masses or convincing adenopathy. There is elevation right hemidiaphragm. Linear opacities noted at the right lung base consistent with scarring or atelectasis. Minor scar or atelectasis is noted at the left lateral lung base. There is a focal area of pleural thickening along the right lateral mid hemi thorax. Remainder of the lungs is clear. No convincing pleural effusion. No pneumothorax. Skeletal structures are demineralized but grossly intact. IMPRESSION: No acute cardiopulmonary disease. No change from the prior radiographs. Electronically Signed   By: Lajean Manes M.D.   On: 01/12/2016 09:28     ASSESSMENT AND PLAN:   80 year old male with a history of diastolic heart failure stage II and preserved ejection fraction with moderate aortic stenosis who presents with anasarca due to acute on chronic diastolic heart failure.  1. Acute on chronic diastolic heart failure: Patient symptoms have improved as well as his edema. He does remain fluid overloaded. Continue IV Lasix. Daily weights and I and O. Appreciate cardiology consult  2. Acute on chronic kidney disease stage II: Creatinine has remained relatively stable.  3. Moderate aortic stenosis: Patient will follow-up with cardiology as a outpatient  4. Chronic pain syndrome/spinal status: Continue outpatient regimen.  5. GERD: Continue PPI    Rounded with nursing  Management plans discussed with the patient and he is in agreement.  CODE STATUS: dnr  TOTAL TIME TAKING CARE OF THIS PATIENT: 29 minutes.     POSSIBLE D/C 2 days, DEPENDING ON CLINICAL CONDITION.   Emalyn Schou M.D on 01/13/2016 at 12:16 PM  Between 7am to 6pm - Pager - [phone] After 6pm go to [email or web address] - password [email or web address] [facility]  Office   [phone]  CC: Primary care physician; Tracie Harrier, MD  Note: This dictation was prepared with Dragon dictation along with smaller phrase technology. Any transcriptional errors that result from this process are unintentional.

## 2016-01-14 DIAGNOSIS — R601 Generalized edema: Secondary | ICD-10-CM

## 2016-01-14 LAB — BASIC METABOLIC PANEL
Anion gap: 9 (ref 5–15)
BUN: 32 mg/dL — AB (ref 6–20)
CHLORIDE: 99 mmol/L — AB (ref 101–111)
CO2: 30 mmol/L (ref 22–32)
CREATININE: 1.71 mg/dL — AB (ref 0.61–1.24)
Calcium: 9.5 mg/dL (ref 8.9–10.3)
GFR calc Af Amer: 42 mL/min — ABNORMAL LOW (ref >60)
GFR calc non Af Amer: 37 mL/min — ABNORMAL LOW (ref >60)
Glucose, Bld: 102 mg/dL — ABNORMAL HIGH (ref 65–99)
POTASSIUM: 4.2 mmol/L (ref 3.5–5.1)
Sodium: 138 mmol/L (ref 135–145)

## 2016-01-14 MED ORDER — ALUM & MAG HYDROXIDE-SIMETH 200-200-20 MG/5ML PO SUSP
30.0000 mL | Freq: Four times a day (QID) | ORAL | Status: DC | PRN
  Administered 2016-01-14: 30 mL via ORAL
  Filled 2016-01-14: qty 30

## 2016-01-14 MED ORDER — FUROSEMIDE 10 MG/ML IJ SOLN
40.0000 mg | Freq: Three times a day (TID) | INTRAMUSCULAR | Status: DC
  Administered 2016-01-14 – 2016-01-16 (×6): 40 mg via INTRAVENOUS
  Filled 2016-01-14 (×6): qty 4

## 2016-01-14 NOTE — Progress Notes (Signed)
[facility] [place] NAME: Nathan Kramer    MR#:  [id number]  DATE OF BIRTH:  [birth year]  SUBJECTIVE:   Patient was getting antsy this morning due to now receiving narcotics. He is more stable now. Patient continues to have lower extremity edema. He denies shortness of breath or chest pain.  REVIEW OF SYSTEMS:    Review of Systems  Constitutional: Negative.  Negative for chills, fever and malaise/fatigue.  HENT: Negative.  Negative for ear discharge, ear pain, hearing loss, nosebleeds and sore throat.   Eyes: Negative.  Negative for blurred vision and pain.  Respiratory: Negative.  Negative for cough, hemoptysis, shortness of breath and wheezing.   Cardiovascular: Positive for leg swelling. Negative for chest pain and palpitations.  Gastrointestinal: Negative.  Negative for abdominal pain, blood in stool, diarrhea, nausea and vomiting.  Genitourinary: Negative.  Negative for dysuria.  Musculoskeletal: Negative.  Negative for back pain.  Skin: Negative.   Neurological: Negative for dizziness, tremors, speech change, focal weakness, seizures and headaches.  Endo/Heme/Allergies: Negative.  Does not bruise/bleed easily.  Psychiatric/Behavioral: Negative.  Negative for depression, hallucinations and suicidal ideas.    Tolerating Diet: yes      DRUG ALLERGIES:   Allergies  Allergen Reactions  . Amoxicillin     Other reaction(s): Localized superficial swelling of skin Face swelling   . Reglan [Metoclopramide]     Involuntary movements body    VITALS:  Blood pressure (!) 127/56, pulse 84, temperature 98.7 F (37.1 C), temperature source Oral, resp. rate 18, height 5\' 8"  (1.727 m), weight 70.5 kg (155 lb 6.4 oz), SpO2 98 %.  PHYSICAL EXAMINATION:   Physical Exam  Constitutional: He is oriented to person, place, and time and well-developed, well-nourished, and in no distress. No distress.  HENT:  Head: Normocephalic.  Eyes: No  scleral icterus.  Neck: Normal range of motion. Neck supple. No JVD present. No tracheal deviation present.  Cardiovascular: Normal rate and regular rhythm.  Exam reveals no gallop and no friction rub.   Murmur heard. Pulmonary/Chest: Effort normal and breath sounds normal. No respiratory distress. He has no wheezes. He has no rales. He exhibits no tenderness.  Abdominal: Soft. Bowel sounds are normal. He exhibits distension. He exhibits no mass. There is no tenderness. There is no rebound and no guarding.  Musculoskeletal: Normal range of motion. He exhibits edema.  Neurological: He is alert and oriented to person, place, and time.  Skin: Skin is warm. No rash noted. No erythema.  Psychiatric: Affect and judgment normal.      LABORATORY PANEL:   CBC  Recent Labs Lab 01/11/16 1055  WBC 12.9*  HGB 11.4*  HCT 36.4*  PLT 248   ------------------------------------------------------------------------------------------------------------------  Chemistries   Recent Labs Lab 01/11/16 1055  01/14/16 0435  NA 135  < > 138  K 4.2  < > 4.2  CL 100*  < > 99*  CO2 27  < > 30  GLUCOSE 98  < > 102*  BUN 32*  < > 32*  CREATININE 1.70*  < > 1.71*  CALCIUM 9.2  < > 9.5  AST 31  --   --   ALT 9*  --   --   ALKPHOS 87  --   --   BILITOT 1.5*  --   --   < > = values in this interval not displayed. ------------------------------------------------------------------------------------------------------------------  Cardiac Enzymes  Recent Labs Lab 01/11/16 1055  TROPONINI <0.03   ------------------------------------------------------------------------------------------------------------------  RADIOLOGY:  No results found.   ASSESSMENT AND PLAN:   80 year old male with a history of diastolic heart failure stage II and preserved ejection fraction with moderate aortic stenosis who presents with anasarca due to acute on chronic diastolic heart failure.  1. Acute on chronic  diastolic heart failure: Symptoms have improved although patient remains fluid overloaded.  Continue Lasix 40 IV every 8. Daily weights and I and O. Appreciate cardiology consult  2. Acute on chronic kidney disease stage II: Creatinine has remained stable. 3. Moderate aortic stenosis: Patient will follow-up with cardiology as a outpatient  4. Chronic pain syndrome/spinal status: Continue outpatient regimen.  5. GERD: Continue PPI    Rounded with nursing Discussed with cardiology  Management plans discussed with the patient and he is in agreement.  CODE STATUS: dnr  TOTAL TIME TAKING CARE OF THIS PATIENT: 24 minutes.     POSSIBLE D/C 2 days, DEPENDING ON CLINICAL CONDITION.   Amit Meloy M.D on 01/14/2016 at 10:43 AM  Between 7am to 6pm - Pager - [phone] After 6pm go to [email or web address] - password [email or web address] [facility]  Office  [phone]  CC: Primary care physician; Tracie Harrier, MD  Note: This dictation was prepared with Dragon dictation along with smaller phrase technology. Any transcriptional errors that result from this process are unintentional.

## 2016-01-14 NOTE — Care Management Important Message (Signed)
Important Message  Patient Details  Name: BRIYON MENSAH MRN: [id number] Date of Birth: 05/21/1935   Medicare Important Message Given:  Yes    Katrina Stack, RN 01/14/2016, 1:44 PM

## 2016-01-14 NOTE — Care Management (Signed)
Continues to diurese on IV lasix.  Patient ambulatory around the nursing unit without any shortness of breath.   Patient is receiving education on heart failure.  It does not appear that patient will meet homebound status for home health services.

## 2016-01-14 NOTE — Progress Notes (Signed)
Patient: Nathan Kramer / Admit Date: 01/11/2016 / Date of Encounter: 01/14/2016, 9:06 AM   Subjective: Agitated this morning, feels that something is wrong, coming out of his skin Wife reports this typically happens when he does not get his pain medication Otherwise reports he feels well, breathing much better, leg edema improving, Legs feel less tight  Review of Systems: Review of Systems  Constitutional: Negative.   Respiratory: Negative.   Cardiovascular: Positive for leg swelling.  Gastrointestinal: Negative.   Musculoskeletal: Positive for back pain.  Neurological: Negative.   Psychiatric/Behavioral: Negative.   All other systems reviewed and are negative.   Objective: Telemetry:  Physical Exam: Blood pressure (!) 127/56, pulse 84, temperature 98.7 F (37.1 C), temperature source Oral, resp. rate 18, height 5\' 8"  (1.727 m), weight 155 lb 6.4 oz (70.5 kg), SpO2 98 %. Body mass index is 23.63 kg/m. General: Well developed, well nourished, in no acute distress. Head: Normocephalic, atraumatic, sclera non-icteric, no xanthomas, nares are without discharge. Neck: Negative for carotid bruits. JVP mildly elevated, 12+ Lungs: Bilateral crackles along the bases. Breathing is unlabored. Heart: RRR S1 S2, III/VI systolic murmur, no rubs, or gallops. 2+ lower extremity edema to below the knees, 1+ edema in the thighs Abdomen: Soft, non-tender, non-distended with normoactive bowel sounds. No rebound/guarding. Extremities: No clubbing or cyanosis. 2+ edema. Distal pedal pulses are 2+ and equal bilaterally. Neuro: Alert and oriented X 3. Moves all extremities spontaneously. Psych:  Responds to questions appropriately with a normal affect.   Intake/Output Summary (Last 24 hours) at 01/14/16 0906 Last data filed at 01/14/16 0426  Gross per 24 hour  Intake              720 ml  Output             2450 ml  Net            -1730 ml    Inpatient Medications:  . enoxaparin  (LOVENOX) injection  40 mg Subcutaneous Q24H  . famotidine  20 mg Oral Daily  . furosemide  40 mg Intravenous Q12H  . morphine  30 mg Oral Q8H  . pantoprazole sodium  40 mg Oral QAC breakfast  . potassium chloride  20 mEq Oral BID  . sodium chloride flush  3 mL Intravenous Q12H   Infusions:    Labs:  Recent Labs  01/13/16 0501 01/14/16 0435  NA 136 138  K 4.2 4.2  CL 98* 99*  CO2 31 30  GLUCOSE 95 102*  BUN 34* 32*  CREATININE 1.73* 1.71*  CALCIUM 9.1 9.5    Recent Labs  01/11/16 1055  AST 31  ALT 9*  ALKPHOS 87  BILITOT 1.5*  PROT 7.1  ALBUMIN 4.1    Recent Labs  01/11/16 1055  WBC 12.9*  HGB 11.4*  HCT 36.4*  MCV 60.7*  PLT 248    Recent Labs  01/11/16 1055  TROPONINI <0.03   Invalid input(s): POCBNP No results for input(s): HGBA1C in the last 72 hours.   Weights: Filed Weights   01/12/16 0421 01/13/16 0356 01/14/16 0420  Weight: 161 lb 2.5 oz (73.1 kg) 159 lb 12.8 oz (72.5 kg) 155 lb 6.4 oz (70.5 kg)     Radiology/Studies:  Ct Abdomen Pelvis Wo Contrast  Result Date: 01/11/2016 CLINICAL DATA:  80 year old male with a history of lower extremity edema. EXAM: CT ABDOMEN AND PELVIS WITHOUT CONTRAST TECHNIQUE: Multidetector CT imaging of the abdomen and pelvis was performed following the  standard protocol without IV contrast. Patient is position on his right side for comfort COMPARISON:  MR lumbar spine 04/24/2013, CT chest 10/29/2015 FINDINGS: Lower chest: Calcifications/stents within coronary arteries. Trace pericardial fluid/thickening, unchanged from comparison CT. Calcifications of the aortic valve. Sub solid nodule of the left lower lobe again demonstrated, not significantly changed in size or configuration from the comparison. Right middle lobe sub solid nodule also visualized. Similar appearance of linear scarring/atelectasis at the lung bases. Early bronchiectasis. Trace pleural fluid bilaterally. Hepatobiliary: Unremarkable appearance of  liver. Unremarkable appearance of the gallbladder. No intrahepatic or extrahepatic biliary ductal dilatation. Pancreas: Atrophic pancreatic parenchyma. No significant calcifications. Spleen: Greatest diameter of the spleen measures over 17 cm. Adrenals/Urinary Tract: Unremarkable appearance of the adrenal glands. Right: Nonobstructive stone within the right collecting system measuring 7 mm. Stone or a vascular calcification at the inferior right renal cortex measuring 10 mm. No right-sided hydronephrosis. Multiple rounded hypodense lesions of the right kidney cortex, some which are too small to characterize. Left: Small nonobstructive stones versus vascular calcifications at the inferior left collecting system measuring 3 mm - 4 mm. No left hydronephrosis. Multiple rounded low-density cystic lesions on the left. Largest on the anterior lateral cortex measures 5 cm. The smallest are too small to characterize by CT. Stomach/Bowel: Unremarkable appearance of stomach. Inflammatory changes and adjacent to the proximal duodenum. Probable duodenum diverticulum. No abnormally distended small bowel. Normal appendix. No evidence of colonic obstruction. Colonic diverticular disease. No associated inflammatory changes. Mesenteric: Free fluid layered over the dome of the liver, extending into the hilum of the liver and into the right pericolic gutter. Note that the patient is position on is right for this CT. Fluid layered dependently within the anatomic pelvis. Vascular/Lymphatic: Hazy infiltration of the small bowel mesenteric. Small lymph nodes present. No significantly enlarged lymph nodes. Scattered calcifications of the abdominal aorta.  No aneurysm. Reproductive: Calcifications of the prostate. Other: Small fact containing umbilical hernia. Musculoskeletal: No displaced fracture. Multilevel degenerative changes of the thoracolumbar spine. Bilateral facet disease of the lower lumbar levels. No significant degenerative  changes of the hips. IMPRESSION: Compared to CT of the chest 10/21/2015, there has been interval development of small volume free fluid within the upper abdomen and infiltration of the mesentery. The etiology of the free fluid is uncertain, and can be related to generalized edema/ anasarca and a positive fluid balance, although reactive process within the abdomen could also contribute to development of free fluid. If there is concern for acute intra-abdominal process, contrast-enhanced CT may be useful. Cardiomegaly and small bilateral pleural effusions. Splenomegaly. Aortic atherosclerosis. Coronary artery disease. Aortic valve calcifications. Bilateral renal cysts, which are most likely benign though not fully characterize on this noncontrast study. Bilateral nonobstructive nephrolithiasis. Diverticular disease without evidence of acute diverticulitis. Sub solid nodules of the left lower lobe and right middle lobe. As was noted on prior chest CT, a follow-up chest CT between 3 and 6 months (after 10/29/2015) is recommended. Signed, Dulcy Fanny. Earleen Newport, DO [facility] Electronically Signed   By: Corrie Mckusick D.O.   On: 01/11/2016 14:20       Assessment and Plan  80 y.o. male  Presenting with worsening shortness of abdominal swelling consistent with acute on chronic diastolic CHF  ----Acute on chronic diastolicCHF responding to IV Lasix 40 twice a day Likely will need additional 2 days of IV Lasix, could consider increasing up to every 8 hours as creatinine stable  -----Moderate aortic valve  stenosis Mean and peak gradient, velocity consistent with moderate disease, No indication for intervention at this time  -----Chronic renal insufficiency Stable creatinine 1.7,with aggressive diuresis  monitor daily  -----Chronic pain He takes morphine 30 mg extended release 3 times per day, Percocet twice a day Chronic back pain, knee  pain Agitation this morning, pain medication had to be given early this morning Wife reports he has withdrawal sym  -----Hypertension Improved with diuresis After diuresis complete, if needed could start low-dose beta blocker/Coreg 3.125 mill grams twice a day At a later date, could add low-dose ARB   Signed, Esmond Plants, MD, Ph.D. [facility] 01/14/2016, 9:06 AM

## 2016-01-15 LAB — BASIC METABOLIC PANEL
ANION GAP: 9 (ref 5–15)
BUN: 35 mg/dL — ABNORMAL HIGH (ref 6–20)
CHLORIDE: 96 mmol/L — AB (ref 101–111)
CO2: 33 mmol/L — AB (ref 22–32)
Calcium: 9.7 mg/dL (ref 8.9–10.3)
Creatinine, Ser: 1.84 mg/dL — ABNORMAL HIGH (ref 0.61–1.24)
GFR calc non Af Amer: 33 mL/min — ABNORMAL LOW (ref >60)
GFR, EST AFRICAN AMERICAN: 39 mL/min — AB (ref >60)
Glucose, Bld: 110 mg/dL — ABNORMAL HIGH (ref 65–99)
POTASSIUM: 5.1 mmol/L (ref 3.5–5.1)
Sodium: 138 mmol/L (ref 135–145)

## 2016-01-15 MED ORDER — POTASSIUM CHLORIDE CRYS ER 20 MEQ PO TBCR
20.0000 meq | EXTENDED_RELEASE_TABLET | Freq: Every day | ORAL | Status: DC
  Administered 2016-01-16: 20 meq via ORAL
  Filled 2016-01-15: qty 1

## 2016-01-15 NOTE — Progress Notes (Signed)
    Subjective:  Denies CP or dyspnea; complains of knee pain   Objective:  Vitals:   01/14/16 1216 01/14/16 1941 01/15/16 0413 01/15/16 0900  BP: (!) 127/56 (!) 127/57 (!) 136/51 (!) 105/44  Pulse: 81 79 78 79  Resp: 18 16 18 18   Temp: 98 F (36.7 C) 98 F (36.7 C) 98.2 F (36.8 C) 97.5 F (36.4 C)  TempSrc: Oral Oral Oral Oral  SpO2: 97% 97% 99% 98%  Weight:   151 lb 6.4 oz (68.7 kg)   Height:        Intake/Output from previous day:  Intake/Output Summary (Last 24 hours) at 01/15/16 1050 Last data filed at 01/15/16 0900  Gross per 24 hour  Intake              603 ml  Output             2400 ml  Net            -1797 ml    Physical Exam: Physical exam: Well-developed well-nourished in no acute distress.  Skin is warm and dry.  HEENT is normal.  Neck is supple.  Chest is clear to auscultation with normal expansion. 2/6 systolic murmur LSB Cardiovascular exam is regular rate and rhythm.  Abdominal exam nontender or distended. No masses palpated. Extremities show 2+ edema. neuro grossly intact    Lab Results: Basic Metabolic Panel:  Recent Labs  01/14/16 0435 01/15/16 0538  NA 138 138  K 4.2 5.1  CL 99* 96*  CO2 30 33*  GLUCOSE 102* 110*  BUN 32* 35*  CREATININE 1.71* 1.84*  CALCIUM 9.5 9.7     Assessment/Plan:  80 year old male with acute on chronic diastolic congestive heart failure  1 acute on chronic diastolic congestive heart failure-I/O-1497; Wt 151; he is improving and renal function stable. I will continue present dose of Lasix. Recheck potassium and renal function tomorrow morning. Possible DC in AM if stable.  2 History of moderate aortic stenosis and aortic insufficiency-patient will follow up with Dr. Yvone Neu following DC.   3 chronic stage III kidney disease-follow renal function closely with diuresis.   4 hypertension-blood pressure controlled. Continue present medications.   Kirk Ruths 01/15/2016, 10:50 AM

## 2016-01-15 NOTE — Progress Notes (Signed)
[facility] at [place] NAME: Nathan Kramer    MR#:  [id number]  DATE OF BIRTH:  24-Oct-1935  SUBJECTIVE:   Symptoms have improved. Lower extremity edema is improving. Abdominal girth has improved. Patient is breathing better.   REVIEW OF SYSTEMS:    Review of Systems  Constitutional: Negative.  Negative for chills, fever and malaise/fatigue.  HENT: Negative.  Negative for ear discharge, ear pain, hearing loss, nosebleeds and sore throat.   Eyes: Negative.  Negative for blurred vision and pain.  Respiratory: Negative.  Negative for cough, hemoptysis, shortness of breath and wheezing.   Cardiovascular: Positive for leg swelling. Negative for chest pain and palpitations.  Gastrointestinal: Negative.  Negative for abdominal pain, blood in stool, diarrhea, nausea and vomiting.  Genitourinary: Negative.  Negative for dysuria.  Musculoskeletal: Negative.  Negative for back pain.  Skin: Negative.   Neurological: Negative for dizziness, tremors, speech change, focal weakness, seizures and headaches.  Endo/Heme/Allergies: Negative.  Does not bruise/bleed easily.  Psychiatric/Behavioral: Negative.  Negative for depression, hallucinations and suicidal ideas.    Tolerating Diet: yes      DRUG ALLERGIES:   Allergies  Allergen Reactions  . Amoxicillin     Other reaction(s): Localized superficial swelling of skin Face swelling   . Reglan [Metoclopramide]     Involuntary movements body    VITALS:  Blood pressure (!) 105/44, pulse 79, temperature 97.5 F (36.4 C), temperature source Oral, resp. rate 18, height 5\' 8"  (1.727 m), weight 68.7 kg (151 lb 6.4 oz), SpO2 98 %.  PHYSICAL EXAMINATION:   Physical Exam  Constitutional: He is oriented to person, place, and time and well-developed, well-nourished, and in no distress. No distress.  HENT:  Head: Normocephalic.  Eyes: No scleral icterus.  Neck: Normal range of motion. Neck supple. No JVD  present. No tracheal deviation present.  Cardiovascular: Normal rate and regular rhythm.  Exam reveals no gallop and no friction rub.   Murmur heard. Pulmonary/Chest: Effort normal and breath sounds normal. No respiratory distress. He has no wheezes. He has no rales. He exhibits no tenderness.  Abdominal: Soft. Bowel sounds are normal. He exhibits no distension and no mass. There is no tenderness. There is no rebound and no guarding.  Musculoskeletal: Normal range of motion. He exhibits edema.  Neurological: He is alert and oriented to person, place, and time.  Skin: Skin is warm. No rash noted. No erythema.  Psychiatric: Affect and judgment normal.      LABORATORY PANEL:   CBC  Recent Labs Lab 01/11/16 1055  WBC 12.9*  HGB 11.4*  HCT 36.4*  PLT 248   ------------------------------------------------------------------------------------------------------------------  Chemistries   Recent Labs Lab 01/11/16 1055  01/15/16 0538  NA 135  < > 138  K 4.2  < > 5.1  CL 100*  < > 96*  CO2 27  < > 33*  GLUCOSE 98  < > 110*  BUN 32*  < > 35*  CREATININE 1.70*  < > 1.84*  CALCIUM 9.2  < > 9.7  AST 31  --   --   ALT 9*  --   --   ALKPHOS 87  --   --   BILITOT 1.5*  --   --   < > = values in this interval not displayed. ------------------------------------------------------------------------------------------------------------------  Cardiac Enzymes  Recent Labs Lab 01/11/16 1055  TROPONINI <0.03   ------------------------------------------------------------------------------------------------------------------  RADIOLOGY:  No results found.   ASSESSMENT AND PLAN:  80 year old male with a history of diastolic heart failure stage II and preserved ejection fraction with moderate aortic stenosis who presents with anasarca due to acute on chronic diastolic heart failure.  1. Acute on chronic diastolic heart failure: Patient with slow improvement, remains fluid  overloaded Continue IV Lasix. Daily weights and I and O. Appreciate cardiology consult Creatinine increased slightly. Repeat BMP in a.m.   2. Acute on chronic kidney disease stage II: repeat creatinine in a.m. If still elevated then may need to decrease dose of Lasix. .  3. Moderate aortic stenosis: Patient will follow-up with cardiology as a outpatient  4. Chronic pain syndrome/spinal status: Continue outpatient regimen.  Patient with long history of narcotic use.  5. GERD: Continue PPI    Rounded with nursing  Management plans discussed with the patient and he is in agreement.  CODE STATUS: dnr  TOTAL TIME TAKING CARE OF THIS PATIENT: 23 minutes.     POSSIBLE D/C 1-2 days, DEPENDING ON CLINICAL CONDITION.   Nathan Kramer M.D on 01/15/2016 at 10:40 AM  Between 7am to 6pm - Pager - [phone] After 6pm go to [email or web address] - password [email or web address] [facility]  Office  [phone]  CC: Primary care physician; Nathan Harrier, MD  Note: This dictation was prepared with Dragon dictation along with smaller phrase technology. Any transcriptional errors that result from this process are unintentional.

## 2016-01-16 LAB — BASIC METABOLIC PANEL
Anion gap: 9 (ref 5–15)
BUN: 36 mg/dL — AB (ref 6–20)
CALCIUM: 9.8 mg/dL (ref 8.9–10.3)
CHLORIDE: 95 mmol/L — AB (ref 101–111)
CO2: 32 mmol/L (ref 22–32)
CREATININE: 1.8 mg/dL — AB (ref 0.61–1.24)
GFR calc non Af Amer: 34 mL/min — ABNORMAL LOW (ref >60)
GFR, EST AFRICAN AMERICAN: 40 mL/min — AB (ref >60)
GLUCOSE: 118 mg/dL — AB (ref 65–99)
Potassium: 4.2 mmol/L (ref 3.5–5.1)
Sodium: 136 mmol/L (ref 135–145)

## 2016-01-16 LAB — CBC
HCT: 41.4 % (ref 40.0–52.0)
Hemoglobin: 12.2 g/dL — ABNORMAL LOW (ref 13.0–18.0)
MCH: 18.3 pg — AB (ref 26.0–34.0)
MCHC: 29.5 g/dL — AB (ref 32.0–36.0)
MCV: 62 fL — AB (ref 80.0–100.0)
PLATELETS: 266 10*3/uL (ref 150–440)
RBC: 6.68 MIL/uL — AB (ref 4.40–5.90)
RDW: 22.1 % — AB (ref 11.5–14.5)
WBC: 17.8 10*3/uL — ABNORMAL HIGH (ref 3.8–10.6)

## 2016-01-16 MED ORDER — POTASSIUM CHLORIDE CRYS ER 20 MEQ PO TBCR: 20.0000 meq | EXTENDED_RELEASE_TABLET | Freq: Every day | ORAL | 0 refills | Status: DC

## 2016-01-16 MED ORDER — FUROSEMIDE 20 MG PO TABS: 60.0000 mg | ORAL_TABLET | Freq: Two times a day (BID) | ORAL | 0 refills | Status: DC

## 2016-01-16 NOTE — Progress Notes (Signed)
Pt to be discharged today. Iv and tele removed. disch instructions given to pt and wife to their understanding. disch via w.c. accompanied

## 2016-01-16 NOTE — Discharge Summary (Signed)
[facility] NAME: Nathan Kramer    MR#:  [id number]  DATE OF BIRTH:  1935/08/27  DATE OF ADMISSION:  01/11/2016 ADMITTING PHYSICIAN: Bettey Costa, MD  DATE OF DISCHARGE: 01/16/2016  PRIMARY CARE PHYSICIAN: Tracie Harrier, MD    ADMISSION DIAGNOSIS:  Dyspnea on exertion [R06.09] DDD (degenerative disc disease), cervical [M50.30] DDD (degenerative disc disease), thoracic [M51.34] DDD (degenerative disc disease), lumbar [M51.36] Sacroiliac joint dysfunction [M53.3] Spinal stenosis, lumbar region, with neurogenic claudication [M48.062] Acute on chronic diastolic congestive heart failure (HCC) [I50.33] Lumbosacral facet joint syndrome [M12.88] Primary osteoarthritis of both knees [M17.0] Aortic stenosis, moderate [I35.0] Ascites [R18.8]  DISCHARGE DIAGNOSIS:  Principal Problem:   Acute on chronic diastolic CHF (congestive heart failure), NYHA class 2 (HCC) Active Problems:   Aortic stenosis, moderate   HTN (hypertension)   Acute kidney injury superimposed on CKD (HCC)   Cardiorenal syndrome   CHF exacerbation (HCC)   Dyspnea on exertion   Bilateral leg edema   Generalized edema   SECONDARY DIAGNOSIS:   Past Medical History:  Diagnosis Date  . Anemia   . Other chronic pain   . Prostate cancer ([place])   . Reflux esophagitis   . Stroke ([place])   . Undiagnosed cardiac murmurs   . Unspecified essential hypertension   . Unspecified visual loss     HOSPITAL COURSE:   80 year old male with a history of diastolic heart failure stage II and preserved ejection fraction with moderate aortic stenosis who presents with anasarca due to acute on chronic diastolic heart failure.  1. Acute on chronic diastolic heart failure: Patient was diuresed with  IV Lasix. He continues to have lower extremity edema however much improved since admission. His abdominal ascites is improved. He is not shortness of breath. He will be transitioned  oral steroids with CHF clinic follow-up in cardiology follow-up as an outpatient. Creatinine has remained stable.   2. Acute on chronic kidney disease stage II: Creatinine has remained relatively stable. Patient will need ongoing follow-up as an outpatient.   3. Moderate aortic stenosis: Patient will follow-up with cardiology as a outpatient  4. Chronic pain syndrome/spinal status: Continue outpatient regimen.  Patient with long history of narcotic use.  5. GERD: Continue PPI    DISCHARGE CONDITIONS AND DIET:  Stable Cardiac diet  CONSULTS OBTAINED:  Treatment Team:  Minna Merritts, MD  DRUG ALLERGIES:   Allergies  Allergen Reactions  . Amoxicillin     Other reaction(s): Localized superficial swelling of skin Face swelling   . Reglan [Metoclopramide]     Involuntary movements body    DISCHARGE MEDICATIONS:   Current Discharge Medication List    START taking these medications   Details  potassium chloride SA (K-DUR,KLOR-CON) 20 MEQ tablet Take 1 tablet (20 mEq total) by mouth daily. Qty: 30 tablet, Refills: 0      CONTINUE these medications which have CHANGED   Details  furosemide (LASIX) 20 MG tablet Take 3 tablets (60 mg total) by mouth 2 (two) times daily. Qty: 90 tablet, Refills: 0      CONTINUE these medications which have NOT CHANGED   Details  diphenhydrAMINE (BENADRYL) 25 MG tablet Take 25 mg by mouth as needed.     esomeprazole (NEXIUM) 10 MG packet Take 20 mg by mouth daily before breakfast.    HYDROcodone-acetaminophen (NORCO) 10-325 MG tablet Limit 1 tablet by mouth 2-4 times per day for breakthrough pain while taking MS Contin if tolerated Qty:  120 tablet, Refills: 0    morphine (MS CONTIN) 30 MG 12 hr tablet Limit one tab by mouth every  8 - 12  hours if tolerated Qty: 90 tablet, Refills: 0    ranitidine (ZANTAC) 150 MG tablet Take 150 mg by mouth daily.              Today   CHIEF COMPLAINT:  So ready to be discharged to  day no chest pain or SOB    VITAL SIGNS:  Blood pressure (!) 129/47, pulse 74, temperature 97.5 F (36.4 C), temperature source Oral, resp. rate 16, height 5\' 8"  (1.727 m), weight 67.7 kg (149 lb 4.8 oz), SpO2 97 %.   REVIEW OF SYSTEMS:  Review of Systems  Constitutional: Negative.  Negative for chills, fever and malaise/fatigue.  HENT: Negative.  Negative for ear discharge, ear pain, hearing loss, nosebleeds and sore throat.   Eyes: Negative.  Negative for blurred vision and pain.  Respiratory: Negative.  Negative for cough, hemoptysis, shortness of breath and wheezing.   Cardiovascular: Positive for leg swelling. Negative for chest pain and palpitations.  Gastrointestinal: Negative.  Negative for abdominal pain, blood in stool, diarrhea, nausea and vomiting.  Genitourinary: Negative.  Negative for dysuria.  Musculoskeletal: Negative.  Negative for back pain.  Skin: Negative.   Neurological: Negative for dizziness, tremors, speech change, focal weakness, seizures and headaches.  Endo/Heme/Allergies: Negative.  Does not bruise/bleed easily.  Psychiatric/Behavioral: Negative.  Negative for depression, hallucinations and suicidal ideas.     PHYSICAL EXAMINATION:  GENERAL:  80 y.o.-year-old patient lying in the bed with no acute distress.  NECK:  Supple, no jugular venous distention. No thyroid enlargement, no tenderness.  LUNGS: Normal breath sounds bilaterally, no wheezing, rales,rhonchi  No use of accessory muscles of respiration.  CARDIOVASCULAR: S1, S2 normal. 4/6 SEM No rubs, or gallops.  ABDOMEN: Soft, non-tender, non-distended. Bowel sounds present. No organomegaly or mass.  EXTREMITIES:++LEE to thighs improved NO cyanosis, or clubbing.  PSYCHIATRIC: The patient is alert and oriented x 3.  SKIN: No obvious rash, lesion, or ulcer.   DATA REVIEW:   CBC  Recent Labs Lab 01/16/16 0417  WBC 17.8*  HGB 12.2*  HCT 41.4  PLT 266    Chemistries   Recent Labs Lab  01/11/16 1055  01/16/16 0417  NA 135  < > 136  K 4.2  < > 4.2  CL 100*  < > 95*  CO2 27  < > 32  GLUCOSE 98  < > 118*  BUN 32*  < > 36*  CREATININE 1.70*  < > 1.80*  CALCIUM 9.2  < > 9.8  AST 31  --   --   ALT 9*  --   --   ALKPHOS 87  --   --   BILITOT 1.5*  --   --   < > = values in this interval not displayed.  Cardiac Enzymes  Recent Labs Lab 01/11/16 1055  TROPONINI <0.03    Microbiology Results  @[email or web address] @  RADIOLOGY:  No results found.    Management plans discussed with the patient and he is in agreement. Stable for discharge home  Patient should follow up with cardiology and chf clinic D/w nursing  CODE STATUS:     Code Status Orders        Start     Ordered   01/11/16 1427  Full code  Continuous     01/11/16 1426    Code Status History  Date Active Date Inactive Code Status Order ID Comments User Context   01/11/2016 12:36 PM 01/11/2016  2:26 PM DNR [id number]  Bettey Costa, MD ED      TOTAL TIME TAKING CARE OF THIS PATIENT: 38 minutes.    Note: This dictation was prepared with Dragon dictation along with smaller phrase technology. Any transcriptional errors that result from this process are unintentional.  Kedar Sedano M.D on 01/16/2016 at 10:11 AM  Between 7am to 6pm - Pager - [phone] After 6pm go to [email or web address] - password [email or web address] [facility]  Office  [phone]  CC: Primary care physician; Tracie Harrier, MD

## 2016-01-16 NOTE — Progress Notes (Signed)
    Subjective:  Denies CP or dyspnea   Objective:  Vitals:   01/15/16 1957 01/16/16 0423 01/16/16 0442 01/16/16 0800  BP: (!) 127/53  (!) 123/54 (!) 129/47  Pulse: 72  80 74  Resp: 20  18 16   Temp: 98 F (36.7 C)  98.4 F (36.9 C) 97.5 F (36.4 C)  TempSrc: Oral  Oral Oral  SpO2: 97%  95% 97%  Weight:  149 lb 4.8 oz (67.7 kg)    Height:        Intake/Output from previous day:  Intake/Output Summary (Last 24 hours) at 01/16/16 1031 Last data filed at 01/16/16 0900  Gross per 24 hour  Intake              363 ml  Output             1350 ml  Net             -987 ml    Physical Exam: Physical exam: Well-developed well-nourished in no acute distress.  Skin is warm and dry.  HEENT is normal.  Neck is supple.  Chest is clear to auscultation with normal expansion. 2/6 systolic murmur LSB Cardiovascular exam is regular rate and rhythm.  Abdominal exam nontender or distended. No masses palpated. Extremities show 1+ edema. neuro grossly intact    Lab Results: Basic Metabolic Panel:  Recent Labs  01/15/16 0538 01/16/16 0417  NA 138 136  K 5.1 4.2  CL 96* 95*  CO2 33* 32  GLUCOSE 110* 118*  BUN 35* 36*  CREATININE 1.84* 1.80*  CALCIUM 9.7 9.8     Assessment/Plan:  80 year old male with acute on chronic diastolic congestive heart failure  1 acute on chronic diastolic congestive heart failure-I/O-867; Wt 149; he has improved since admission. Would change lasix to 40 mg daily; needs low Na diet, fluid restriction to 2 liter or less daily and dialy weights; check BMET Wed10/18. FU with Dr Yvone Neu as scheduled. Pt can be DCed from a cardiac standpoint.   2 History of moderate aortic stenosis and aortic insufficiency-patient will follow up with Dr. Yvone Neu following DC.   3 chronic stage III kidney disease-follow renal function closely following DC.   4 hypertension-blood pressure controlled. Continue present medications.   5 Lung nodule-will need fu per primary  care.  Please call with questions.  Kirk Ruths 01/16/2016, 10:31 AM

## 2016-01-20 ENCOUNTER — Ambulatory Visit: Payer: Medicare Other | Admitting: Family

## 2016-01-27 ENCOUNTER — Ambulatory Visit: Payer: Medicare Other | Admitting: Cardiology

## 2016-01-27 ENCOUNTER — Encounter: Payer: Self-pay | Admitting: Cardiology

## 2016-01-27 ENCOUNTER — Ambulatory Visit (INDEPENDENT_AMBULATORY_CARE_PROVIDER_SITE_OTHER): Payer: Medicare Other | Admitting: Cardiology

## 2016-01-27 VITALS — BP 110/50 | HR 90 | Ht 68.0 in | Wt 150.8 lb

## 2016-01-27 DIAGNOSIS — I350 Nonrheumatic aortic (valve) stenosis: Secondary | ICD-10-CM | POA: Diagnosis not present

## 2016-01-27 DIAGNOSIS — I5033 Acute on chronic diastolic (congestive) heart failure: Secondary | ICD-10-CM | POA: Diagnosis not present

## 2016-01-27 DIAGNOSIS — I351 Nonrheumatic aortic (valve) insufficiency: Secondary | ICD-10-CM

## 2016-01-27 DIAGNOSIS — I10 Essential (primary) hypertension: Secondary | ICD-10-CM | POA: Diagnosis not present

## 2016-01-27 MED ORDER — FUROSEMIDE 20 MG PO TABS: 60.0000 mg | ORAL_TABLET | Freq: Two times a day (BID) | ORAL | 3 refills | Status: DC

## 2016-01-27 MED ORDER — POTASSIUM CHLORIDE CRYS ER 20 MEQ PO TBCR: 20.0000 meq | EXTENDED_RELEASE_TABLET | Freq: Every day | ORAL | 3 refills | Status: DC

## 2016-01-27 NOTE — Progress Notes (Signed)
Cardiology Office Note   Date:  01/27/2016   ID:  Nathan Kramer, DOB 1935/08/08, MRN [id number]  Referring Doctor:  Tracie Harrier, MD   Cardiologist:   Wende Bushy, MD   Reason for consultation:  Chief Complaint  Patient presents with  . other    1 month f/u pt recently in the hospital due to fluid retention c/o weakness and head spinning. Meds reviewed verbally with pt.      History of Present Illness: Nathan Kramer is a 80 y.o. male who presents for ffup for chf, AS  Patient reports that he ended up in the hospital due to worsening leg swelling, development of significant shortness of breath. He received IV Lasix in the hospital and was discharged on no higher dose of Lasix by mouth. Since discharge, he is feeling much better. No PND, orthopnea. He thinks the swelling is improved.   Patient denies chest pain and loss of consciousness. No fever, cough, colds, abdominal pain. No orthopnea and no PND.   ROS:  Please see the history of present illness. Aside from mentioned under HPI, all other systems are reviewed and negative.     Past Medical History:  Diagnosis Date  . Anemia   . Other chronic pain   . Prostate cancer ([place])   . Reflux esophagitis   . Stroke ([place])   . Undiagnosed cardiac murmurs   . Unspecified essential hypertension   . Unspecified visual loss     Past Surgical History:  Procedure Laterality Date  . back surgery    . COLONOSCOPY       reports that he has been smoking Cigarettes.  He has a 50.00 pack-year smoking history. He has never used smokeless tobacco. He reports that he does not drink alcohol or use drugs.   family history includes Cancer in his sister; Diabetes in his mother; Heart disease in his brother and father; Hypertension in his father and mother.   Current Outpatient Prescriptions  Medication Sig Dispense Refill  . diphenhydrAMINE (BENADRYL) 25 MG tablet Take 25 mg by mouth as needed.     [name] esomeprazole (NEXIUM) 10  MG packet Take 20 mg by mouth daily before breakfast.    . furosemide (LASIX) 20 MG tablet Take 3 tablets (60 mg total) by mouth 2 (two) times daily. 270 tablet 3  . HYDROcodone-acetaminophen (NORCO) 10-325 MG tablet Limit 1 tablet by mouth 2-4 times per day for breakthrough pain while taking MS Contin if tolerated 120 tablet 0  . morphine (MS CONTIN) 30 MG 12 hr tablet Limit one tab by mouth every  8 - 12  hours if tolerated 90 tablet 0  . potassium chloride SA (K-DUR,KLOR-CON) 20 MEQ tablet Take 1 tablet (20 mEq total) by mouth daily. 90 tablet 3  . ranitidine (ZANTAC) 150 MG tablet Take 150 mg by mouth daily.     No current facility-administered medications for this visit.     Allergies: Amoxicillin and Reglan [metoclopramide]    PHYSICAL EXAM: VS:  BP (!) 110/50 (BP Location: Left Arm, Patient Position: Sitting, Cuff Size: Normal)   Pulse 90   Ht 5\' 8"  (1.727 m)   Wt 150 lb 12 oz (68.4 kg)   BMI 22.92 kg/m  , Body mass index is 22.92 kg/m. Wt Readings from Last 3 Encounters:  01/27/16 150 lb 12 oz (68.4 kg)  01/16/16 149 lb 4.8 oz (67.7 kg)  12/27/15 162 lb (73.5 kg)    GENERAL:  well developed, well  nourished, not in acute distress HEENT: normocephalic, pink conjunctivae, anicteric sclerae, no xanthelasma, normal dentition, oropharynx clear NECK:  no neck vein engorgement, JVP normal, no hepatojugular reflux, carotid upstroke brisk and symmetric, no bruit, no thyromegaly, no lymphadenopathy LUNGS:  good respiratory effort, clear to auscultation bilaterally CV:  PMI not displaced, no thrills, no lifts, S1 and S2 within normal limits, no palpable S3 or S4, no rubs, no gallops, 3/6 systolic ejection murmur consistent with aortic stenosis, nonradiating, no delay in carotid upstroke ABD:  Soft, nontender, nondistended, normoactive bowel sounds, no abdominal aortic bruit, no hepatomegaly, no splenomegaly MS: nontender back, no kyphosis, no scoliosis, no joint deformities EXT:  2+  DP/PT pulses,  +2 edema, varicosities, no cyanosis, no clubbing SKIN: warm, nondiaphoretic, normal turgor, no ulcers NEUROPSYCH: alert, oriented to person, place, and time, sensory/motor grossly intact, normal mood, appropriate affect  Recent Labs: 01/11/2016: ALT 9; B Natriuretic Peptide 1,166.0 01/16/2016: BUN 36; Creatinine, Ser 1.80; Hemoglobin 12.2; Platelets 266; Potassium 4.2; Sodium 136   Lipid Panel No results found for: CHOL, TRIG, HDL, CHOLHDL, VLDL, LDLCALC, LDLDIRECT   Other studies Reviewed:  EKG:  The ekg from 08/24/2015 was personally reviewed by me and it revealed sinus rhythm, 81 BPM. T wave inversions. Poor R-wave progression.  EKG from 12/27/2015 was personally reviewed by me and showed normal sinus rhythm, 79 BPM. T wave inversion. QT 400 ms, QTC 433 ms, within normal limits.  Additional studies/ records that were reviewed personally reviewed by me today include:  Echo 09/07/2015: Left ventricle: The cavity size was normal. There was mild  concentric hypertrophy. Systolic function was normal. The  estimated ejection fraction was in the range of 55% to 60%. Wall  motion was normal; there were no regional wall motion  abnormalities. Features are consistent with a pseudonormal left  ventricular filling pattern, with concomitant abnormal relaxation  and increased filling pressure (grade 2 diastolic dysfunction). - Aortic valve: There was moderate to severe stenosis. There was  moderate regurgitation. Mean gradient (S): 25 mm Hg. Valve area  (VTI): 0.84 cm^2. Valve area (Vmax): 0.81 cm^2. - Aorta: Ascending aortic diameter: 37 mm (S). - Mitral valve: Calcified annulus. Mildly thickened, moderately  calcified leaflets . The findings are consistent with mild  stenosis. Valve area by continuity equation (using LVOT flow):  1.84 cm^2. - Left atrium: The atrium was mildly dilated. - Pulmonary arteries: Systolic pressure was moderately increased.  PA peak  pressure: 55 mm Hg (S).  Aortic valve Value Reference Aortic valve peak velocity, S 313 cm/s --------- Aortic valve mean velocity, S 237 cm/s --------- Aortic valve VTI, S 72.2 cm --------- Aortic mean gradient, S 25 mm Hg --------- Aortic peak gradient, S 39 mm Hg --------- VTI ratio, LVOT/AV 0.31 --------- Aortic valve area, VTI 0.84 cm^2 --------- Aortic valve area/bsa, VTI 0.45 cm^2/m^2 --------- Velocity ratio, peak, LVOT/AV 0.32 --------- Aortic valve area, peak velocity 0.81 cm^2 --------- Aortic valve area/bsa, peak 0.43 cm^2/m^2 --------- velocity Velocity ratio, mean, LVOT/AV 0.28 --------- Aortic valve area, mean velocity 0.71 cm^2 --------- Aortic valve area/bsa, mean 0.38 cm^2/m^2 --------- velocity Aortic regurg pressure half-time 302 ms ---------  Nuclear stress is 10/04/2015: Pharmacological myocardial perfusion imaging study with no significant  ischemia GI uptake artifact noted Normal wall motion, EF estimated at 50% (ejection fraction likely reduce secondary to artifact around the inferior wall) No EKG changes concerning for ischemia at peak stress or in recovery. Nonspecific ST abnormality noted at rest Low risk scan  ASSESSMENT AND PLAN:  Aortic stenosis Aortic insufficiency Previous medical records reports mild to moderate aortic stenosis  most recent echocardiogram reveals moderate severe aortic stenosis and moderate aortic insufficiency.  Peak aortic velocity is less than 4 m/s. Mean aortic is 53mmHg, Stress testing did not reveal any evidence of ischemia. Findings discussed with patient. Patient not  really leaning towards surgery down the line. He and wife are concerned that his quality of his life is limited mainly by multiple joint issues and back issues. This is mainly what stops him most from doing further physical activity and not necessarily shortness of breath.  CHF, preserved ejection fraction, chronic No evidence of neck vein engorgement. Still some swelling but improved from before. Patient complains of some lightheadedness but mainly with stooping down. Recommend to stay on Lasix total of 60 mg twice a day, potassium replacement. Recommend BMP in 1 week. Recommend follow-up in one month but sooner if he develops worsening shortness of breath or worsening leg swelling. CHF teaching done. Sodium restriction recommended.  Hypertension BP is well controlled. Continue monitoring BP. Will continue to hold off on metoprolol for now and advised patient to monitor blood pressure closely.   Current medicines are reviewed at length with the patient today.  The patient does not have concerns regarding medicines.  Labs/ tests ordered today include:  Orders Placed This Encounter  Procedures  . Basic Metabolic Panel (BMET)    I had a lengthy and detailed discussion with the patient regarding diagnoses, prognosis, diagnostic options, treatment options , and side effects of medications.   I counseled the patient on importance of lifestyle modification including heart healthy diet, regular physical activity .   Disposition:   FU with undersigned  In one month  Signed, Wende Bushy, MD  01/27/2016 9:44 AM    [facility]

## 2016-01-27 NOTE — Patient Instructions (Signed)
Medication Instructions:  Your physician has recommended you make the following change in your medication:  1. Refills sent in for Furosemide and Potassium.    Labwork: Your physician recommends that you return for lab work in: 1 week for BMP.   Follow-Up: Your physician recommends that you schedule a follow-up appointment in: 1 month with Dr. Yvone Neu.   It was a pleasure seeing you today here in the office. Please do not hesitate to give [place] a call back if you have any further questions. [place], BSN

## 2016-01-27 NOTE — Addendum Note (Signed)
Addended by: Valora Corporal on: 01/27/2016 09:55 AM   Modules accepted: Orders

## 2016-02-03 ENCOUNTER — Other Ambulatory Visit (INDEPENDENT_AMBULATORY_CARE_PROVIDER_SITE_OTHER): Payer: Medicare Other

## 2016-02-03 DIAGNOSIS — I10 Essential (primary) hypertension: Secondary | ICD-10-CM

## 2016-02-03 DIAGNOSIS — I5033 Acute on chronic diastolic (congestive) heart failure: Secondary | ICD-10-CM

## 2016-02-04 ENCOUNTER — Telehealth: Payer: Self-pay | Admitting: *Deleted

## 2016-02-04 DIAGNOSIS — I509 Heart failure, unspecified: Secondary | ICD-10-CM

## 2016-02-04 LAB — BASIC METABOLIC PANEL
BUN/Creatinine Ratio: 26 — ABNORMAL HIGH (ref 10–24)
BUN: 50 mg/dL — AB (ref 8–27)
CHLORIDE: 97 mmol/L (ref 96–106)
CO2: 21 mmol/L (ref 18–29)
CREATININE: 1.93 mg/dL — AB (ref 0.76–1.27)
Calcium: 9.5 mg/dL (ref 8.6–10.2)
GFR calc Af Amer: 37 mL/min/{1.73_m2} — ABNORMAL LOW (ref >59)
GFR calc non Af Amer: 32 mL/min/{1.73_m2} — ABNORMAL LOW (ref >59)
GLUCOSE: 88 mg/dL (ref 65–99)
Potassium: 4.3 mmol/L (ref 3.5–5.2)
Sodium: 141 mmol/L (ref 134–144)

## 2016-02-04 NOTE — Telephone Encounter (Signed)
Reviewed results with patients wife per release form and scheduled him to come back in to have repeat labs in 2 weeks. She verbalized understanding to decrease lasix to 40 mg twice daily and remain on current does of potassium. She had no further questions at this time and stated that she would make sure the changes were made. Instructed her to call back if she has any further questions.

## 2016-02-04 NOTE — Telephone Encounter (Signed)
-----   Message from Wende Bushy, MD sent at 02/04/2016  9:07 AM EDT ----- BUN up. Recommend to decrease Lasix to 40mg  bid, continue same K supplement, and rpt bmp in 2 weeks

## 2016-02-14 ENCOUNTER — Other Ambulatory Visit: Payer: Self-pay | Admitting: *Deleted

## 2016-02-14 ENCOUNTER — Telehealth: Payer: Self-pay | Admitting: Cardiology

## 2016-02-14 DIAGNOSIS — I10 Essential (primary) hypertension: Secondary | ICD-10-CM

## 2016-02-14 DIAGNOSIS — I5033 Acute on chronic diastolic (congestive) heart failure: Secondary | ICD-10-CM

## 2016-02-14 MED ORDER — FUROSEMIDE 20 MG PO TABS: 40.0000 mg | ORAL_TABLET | Freq: Two times a day (BID) | ORAL | 3 refills | Status: DC

## 2016-02-14 NOTE — Telephone Encounter (Signed)
Furosemide 20 mg tablet, pt takes 2 tablets twice daily per [place] #90 R# 3 sent to local pharmacy.

## 2016-02-14 NOTE — Telephone Encounter (Signed)
°*  STAT* If patient is at the pharmacy, call can be transferred to refill team.   1. Which medications need to be refilled? (please list name of each medication and dose if known)  Furosamide 20 mg 4 times a day   2. Which pharmacy/location (including street and city if local pharmacy) is medication to be sent to? [facility] on [address]   3. Do they need a 30 day or 90 day supply?  90 day

## 2016-02-17 ENCOUNTER — Other Ambulatory Visit (INDEPENDENT_AMBULATORY_CARE_PROVIDER_SITE_OTHER): Payer: Medicare Other | Admitting: *Deleted

## 2016-02-17 DIAGNOSIS — I509 Heart failure, unspecified: Secondary | ICD-10-CM

## 2016-02-18 LAB — BASIC METABOLIC PANEL WITH GFR
BUN/Creatinine Ratio: 20 (ref 10–24)
BUN: 38 mg/dL — ABNORMAL HIGH (ref 8–27)
CO2: 26 mmol/L (ref 18–29)
Calcium: 8.8 mg/dL (ref 8.6–10.2)
Chloride: 97 mmol/L (ref 96–106)
Creatinine, Ser: 1.9 mg/dL — ABNORMAL HIGH (ref 0.76–1.27)
GFR calc Af Amer: 38 mL/min/{1.73_m2} — ABNORMAL LOW
GFR calc non Af Amer: 33 mL/min/{1.73_m2} — ABNORMAL LOW
Glucose: 102 mg/dL — ABNORMAL HIGH (ref 65–99)
Potassium: 4.4 mmol/L (ref 3.5–5.2)
Sodium: 140 mmol/L (ref 134–144)

## 2016-03-02 ENCOUNTER — Encounter: Payer: Self-pay | Admitting: Cardiology

## 2016-03-02 ENCOUNTER — Ambulatory Visit (INDEPENDENT_AMBULATORY_CARE_PROVIDER_SITE_OTHER): Payer: Medicare Other | Admitting: Cardiology

## 2016-03-02 VITALS — BP 118/70 | HR 88 | Ht 68.0 in | Wt 145.8 lb

## 2016-03-02 DIAGNOSIS — I10 Essential (primary) hypertension: Secondary | ICD-10-CM | POA: Diagnosis not present

## 2016-03-02 DIAGNOSIS — I5032 Chronic diastolic (congestive) heart failure: Secondary | ICD-10-CM | POA: Diagnosis not present

## 2016-03-02 DIAGNOSIS — I350 Nonrheumatic aortic (valve) stenosis: Secondary | ICD-10-CM

## 2016-03-02 DIAGNOSIS — I351 Nonrheumatic aortic (valve) insufficiency: Secondary | ICD-10-CM

## 2016-03-02 NOTE — Progress Notes (Signed)
Cardiology Office Note   Date:  03/02/2016   ID:  Nathan Kramer, DOB 1935/04/21, MRN [id number]  Referring Doctor:  Tracie Harrier, MD   Cardiologist:   Wende Bushy, MD   Reason for consultation:  Chief Complaint  Patient presents with  . other    1 month follow up as well as discuss lab work. Meds reviewed by the pt. verbally.       History of Present Illness: Nathan Kramer is a 80 y.o. male who presents for ffup for chf, AS  Since last visit, patient has felt better. He thinks that the swelling is much improved. He does not have any chest pain. Shortness of breath is improved. No PND, orthopnea.    Patient denies chest pain and loss of consciousness. No fever, cough, colds, abdominal pain.   ROS:  Please see the history of present illness. Aside from mentioned under HPI, all other systems are reviewed and negative.     Past Medical History:  Diagnosis Date  . Anemia   . Other chronic pain   . Prostate cancer ([place])   . Reflux esophagitis   . Stroke ([place])   . Undiagnosed cardiac murmurs   . Unspecified essential hypertension   . Unspecified visual loss     Past Surgical History:  Procedure Laterality Date  . back surgery    . COLONOSCOPY       reports that he has been smoking Cigarettes.  He has a 50.00 pack-year smoking history. He has never used smokeless tobacco. He reports that he does not drink alcohol or use drugs.   family history includes Cancer in his sister; Diabetes in his mother; Heart disease in his brother and father; Hypertension in his father and mother.   Current Outpatient Prescriptions  Medication Sig Dispense Refill  . diphenhydrAMINE (BENADRYL) 25 MG tablet Take 25 mg by mouth as needed.     [name] esomeprazole (NEXIUM) 10 MG packet Take 20 mg by mouth daily before breakfast.    . furosemide (LASIX) 20 MG tablet Take 2 tablets (40 mg total) by mouth 2 (two) times daily. 180 tablet 3  . HYDROcodone-acetaminophen (NORCO) 10-325 MG  tablet Limit 1 tablet by mouth 2-4 times per day for breakthrough pain while taking MS Contin if tolerated 120 tablet 0  . morphine (MS CONTIN) 30 MG 12 hr tablet Limit one tab by mouth every  8 - 12  hours if tolerated 90 tablet 0  . potassium chloride SA (K-DUR,KLOR-CON) 20 MEQ tablet Take 1 tablet (20 mEq total) by mouth daily. 90 tablet 3   No current facility-administered medications for this visit.     Allergies: Amoxicillin and Reglan [metoclopramide]    PHYSICAL EXAM: VS:  BP 118/70 (BP Location: Left Arm, Patient Position: Sitting, Cuff Size: Normal)   Pulse 88   Ht 5\' 8"  (1.727 m)   Wt 145 lb 12 oz (66.1 kg)   BMI 22.16 kg/m  , Body mass index is 22.16 kg/m. Wt Readings from Last 3 Encounters:  03/02/16 145 lb 12 oz (66.1 kg)  01/27/16 150 lb 12 oz (68.4 kg)  01/16/16 149 lb 4.8 oz (67.7 kg)    GENERAL:  well developed, well nourished, not in acute distress HEENT: normocephalic, pink conjunctivae, anicteric sclerae, no xanthelasma, normal dentition, oropharynx clear NECK:  no neck vein engorgement, JVP normal, no hepatojugular reflux, carotid upstroke brisk and symmetric, no bruit, no thyromegaly, no lymphadenopathy LUNGS:  good respiratory effort, clear to auscultation  bilaterally CV:  PMI not displaced, no thrills, no lifts, S1 and S2 within normal limits, no palpable S3 or S4, no rubs, no gallops, 3/6 systolic ejection murmur consistent with aortic stenosis, nonradiating, no delay in carotid upstroke ABD:  Soft, nontender, nondistended, normoactive bowel sounds, no abdominal aortic bruit, no hepatomegaly, no splenomegaly MS: nontender back, no kyphosis, no scoliosis, no joint deformities EXT:  2+ DP/PT pulses,  +2 edema, varicosities, no cyanosis, no clubbing SKIN: warm, nondiaphoretic, normal turgor, no ulcers NEUROPSYCH: alert, oriented to person, place, and time, sensory/motor grossly intact, normal mood, appropriate affect  Recent Labs: 01/11/2016: ALT 9; B  Natriuretic Peptide 1,166.0 01/16/2016: Hemoglobin 12.2; Platelets 266 02/17/2016: BUN 38; Creatinine, Ser 1.90; Potassium 4.4; Sodium 140   Lipid Panel No results found for: CHOL, TRIG, HDL, CHOLHDL, VLDL, LDLCALC, LDLDIRECT   Other studies Reviewed:  EKG:  The ekg from 08/24/2015 was personally reviewed by me and it revealed sinus rhythm, 81 BPM. T wave inversions. Poor R-wave progression.  EKG from 12/27/2015 was personally reviewed by me and showed normal sinus rhythm, 79 BPM. T wave inversion. QT 400 ms, QTC 433 ms, within normal limits.  Additional studies/ records that were reviewed personally reviewed by me today include:  Echo 09/07/2015: Left ventricle: The cavity size was normal. There was mild  concentric hypertrophy. Systolic function was normal. The  estimated ejection fraction was in the range of 55% to 60%. Wall  motion was normal; there were no regional wall motion  abnormalities. Features are consistent with a pseudonormal left  ventricular filling pattern, with concomitant abnormal relaxation  and increased filling pressure (grade 2 diastolic dysfunction). - Aortic valve: There was moderate to severe stenosis. There was  moderate regurgitation. Mean gradient (S): 25 mm Hg. Valve area  (VTI): 0.84 cm^2. Valve area (Vmax): 0.81 cm^2. - Aorta: Ascending aortic diameter: 37 mm (S). - Mitral valve: Calcified annulus. Mildly thickened, moderately  calcified leaflets . The findings are consistent with mild  stenosis. Valve area by continuity equation (using LVOT flow):  1.84 cm^2. - Left atrium: The atrium was mildly dilated. - Pulmonary arteries: Systolic pressure was moderately increased.  PA peak pressure: 55 mm Hg (S).  Aortic valve Value Reference Aortic valve peak velocity, S 313 cm/s --------- Aortic valve mean velocity, S 237 cm/s --------- Aortic valve VTI,  S 72.2 cm --------- Aortic mean gradient, S 25 mm Hg --------- Aortic peak gradient, S 39 mm Hg --------- VTI ratio, LVOT/AV 0.31 --------- Aortic valve area, VTI 0.84 cm^2 --------- Aortic valve area/bsa, VTI 0.45 cm^2/m^2 --------- Velocity ratio, peak, LVOT/AV 0.32 --------- Aortic valve area, peak velocity 0.81 cm^2 --------- Aortic valve area/bsa, peak 0.43 cm^2/m^2 --------- velocity Velocity ratio, mean, LVOT/AV 0.28 --------- Aortic valve area, mean velocity 0.71 cm^2 --------- Aortic valve area/bsa, mean 0.38 cm^2/m^2 --------- velocity Aortic regurg pressure half-time 302 ms ---------  Nuclear stress is 10/04/2015: Pharmacological myocardial perfusion imaging study with no significant  ischemia GI uptake artifact noted Normal wall motion, EF estimated at 50% (ejection fraction likely reduce secondary to artifact around the inferior wall) No EKG changes concerning for ischemia at peak stress or in recovery. Nonspecific ST abnormality noted at rest Low risk scan  ASSESSMENT AND PLAN:  Aortic stenosis Aortic insufficiency Previous medical records reports mild to moderate aortic stenosis  most recent echocardiogram reveals moderate severe aortic stenosis and moderate aortic insufficiency.  Peak aortic velocity is less than 4 m/s. Mean aortic is 46mmHg. Stress testing did not reveal any evidence  of ischemia. Findings discussed with patient. Patient not really leaning towards surgery down the line. He and wife are concerned that his quality of his life is limited mainly by multiple joint issues and back issues. This is mainly what stops him most from doing further physical activity and not necessarily  shortness of breath.  CHF, preserved ejection fraction, chronic No evidence of neck vein engorgement. Still some swelling but improved from before.  Recommend to stay on Lasix total of40 mg twice a day, potassium replacement. Renal function is better on this dose as compared to the higher dose of 60 twice a day. Recommend BMP in 1 month Recommend follow-up in 3 months but sooner if he develops worsening shortness of breath or worsening leg swelling. CHF teaching done. Sodium restriction recommended.  Hypertension BP is well controlled. Continue monitoring BP. Will continue to hold off on metoprolol for now and advised patient to monitor blood pressure closely.   Current medicines are reviewed at length with the patient today.  The patient does not have concerns regarding medicines.  Labs/ tests ordered today include:  No orders of the defined types were placed in this encounter.   I had a lengthy and detailed discussion with the patient regarding diagnoses, prognosis, diagnostic options, treatment options , and side effects of medications.   I counseled the patient on importance of lifestyle modification including heart healthy diet, regular physical activity .   Disposition:   FU with undersigned  in 3 months time  Signed, Wende Bushy, MD  03/02/2016 11:15 AM    [place]

## 2016-03-02 NOTE — Patient Instructions (Signed)
Labwork: Your physician recommends that you return for lab work in: 1 month with BMP.  Follow-Up: Your physician recommends that you schedule a follow-up appointment in: 3 months with Dr. Yvone Neu.  It was a pleasure seeing you today here in the office. Please do not hesitate to give [place] a call back if you have any further questions. [place], BSN

## 2016-03-30 ENCOUNTER — Other Ambulatory Visit (INDEPENDENT_AMBULATORY_CARE_PROVIDER_SITE_OTHER): Payer: Medicare Other

## 2016-03-30 DIAGNOSIS — I10 Essential (primary) hypertension: Secondary | ICD-10-CM | POA: Diagnosis not present

## 2016-03-30 DIAGNOSIS — I5032 Chronic diastolic (congestive) heart failure: Secondary | ICD-10-CM

## 2016-03-31 LAB — BASIC METABOLIC PANEL
BUN/Creatinine Ratio: 16 (ref 10–24)
BUN: 28 mg/dL — AB (ref 8–27)
CO2: 23 mmol/L (ref 18–29)
CREATININE: 1.77 mg/dL — AB (ref 0.76–1.27)
Calcium: 9 mg/dL (ref 8.6–10.2)
Chloride: 98 mmol/L (ref 96–106)
GFR calc Af Amer: 42 mL/min/{1.73_m2} — ABNORMAL LOW (ref >59)
GFR calc non Af Amer: 36 mL/min/{1.73_m2} — ABNORMAL LOW (ref >59)
GLUCOSE: 108 mg/dL — AB (ref 65–99)
Potassium: 4.9 mmol/L (ref 3.5–5.2)
Sodium: 139 mmol/L (ref 134–144)

## 2016-04-12 ENCOUNTER — Telehealth: Payer: Self-pay | Admitting: Cardiology

## 2016-04-12 ENCOUNTER — Other Ambulatory Visit: Payer: Self-pay | Admitting: *Deleted

## 2016-04-12 DIAGNOSIS — I5033 Acute on chronic diastolic (congestive) heart failure: Secondary | ICD-10-CM

## 2016-04-12 DIAGNOSIS — I10 Essential (primary) hypertension: Secondary | ICD-10-CM

## 2016-04-12 MED ORDER — FUROSEMIDE 20 MG PO TABS: 40.0000 mg | ORAL_TABLET | Freq: Two times a day (BID) | ORAL | 3 refills | Status: DC

## 2016-04-12 NOTE — Telephone Encounter (Signed)
Requested Prescriptions   Signed Prescriptions Disp Refills  . furosemide (LASIX) 20 MG tablet 360 tablet 3    Sig: Take 2 tablets (40 mg total) by mouth 2 (two) times daily.    Authorizing Provider: Wende Bushy    Ordering User: Britt Bottom

## 2016-04-12 NOTE — Telephone Encounter (Signed)
°*  STAT* If patient is at the pharmacy, call can be transferred to refill team.   1. Which medications need to be refilled? (please list name of each medication and dose if known) Frusemide   2. Which pharmacy/location (including street and city if local pharmacy) is medication to be sent to? [facility] on [address]   3. Do they need a 30 day or 90 day supply? 90 day

## 2016-04-14 ENCOUNTER — Other Ambulatory Visit: Payer: Self-pay | Admitting: Cardiology

## 2016-04-14 DIAGNOSIS — I5033 Acute on chronic diastolic (congestive) heart failure: Secondary | ICD-10-CM

## 2016-04-14 DIAGNOSIS — I10 Essential (primary) hypertension: Secondary | ICD-10-CM

## 2016-04-18 ENCOUNTER — Telehealth: Payer: Self-pay | Admitting: Cardiology

## 2016-04-18 ENCOUNTER — Ambulatory Visit: Payer: Medicare Other | Attending: Anesthesiology | Admitting: Anesthesiology

## 2016-04-18 ENCOUNTER — Encounter: Payer: Self-pay | Admitting: Anesthesiology

## 2016-04-18 VITALS — BP 118/58 | HR 87 | Temp 97.7°F | Resp 16 | Ht 68.0 in | Wt 150.0 lb

## 2016-04-18 DIAGNOSIS — F17210 Nicotine dependence, cigarettes, uncomplicated: Secondary | ICD-10-CM | POA: Insufficient documentation

## 2016-04-18 DIAGNOSIS — M5030 Other cervical disc degeneration, unspecified cervical region: Secondary | ICD-10-CM

## 2016-04-18 DIAGNOSIS — M25552 Pain in left hip: Secondary | ICD-10-CM | POA: Diagnosis not present

## 2016-04-18 DIAGNOSIS — M546 Pain in thoracic spine: Secondary | ICD-10-CM | POA: Diagnosis not present

## 2016-04-18 DIAGNOSIS — M170 Bilateral primary osteoarthritis of knee: Secondary | ICD-10-CM

## 2016-04-18 DIAGNOSIS — K210 Gastro-esophageal reflux disease with esophagitis: Secondary | ICD-10-CM | POA: Insufficient documentation

## 2016-04-18 DIAGNOSIS — M48062 Spinal stenosis, lumbar region with neurogenic claudication: Secondary | ICD-10-CM

## 2016-04-18 DIAGNOSIS — M79604 Pain in right leg: Secondary | ICD-10-CM | POA: Insufficient documentation

## 2016-04-18 DIAGNOSIS — M25551 Pain in right hip: Secondary | ICD-10-CM | POA: Insufficient documentation

## 2016-04-18 DIAGNOSIS — Z8546 Personal history of malignant neoplasm of prostate: Secondary | ICD-10-CM | POA: Diagnosis not present

## 2016-04-18 DIAGNOSIS — M5441 Lumbago with sciatica, right side: Secondary | ICD-10-CM | POA: Diagnosis not present

## 2016-04-18 DIAGNOSIS — M5442 Lumbago with sciatica, left side: Secondary | ICD-10-CM | POA: Diagnosis not present

## 2016-04-18 DIAGNOSIS — D649 Anemia, unspecified: Secondary | ICD-10-CM | POA: Diagnosis not present

## 2016-04-18 DIAGNOSIS — Z8673 Personal history of transient ischemic attack (TIA), and cerebral infarction without residual deficits: Secondary | ICD-10-CM | POA: Insufficient documentation

## 2016-04-18 DIAGNOSIS — M79605 Pain in left leg: Secondary | ICD-10-CM | POA: Insufficient documentation

## 2016-04-18 DIAGNOSIS — M1288 Other specific arthropathies, not elsewhere classified, other specified site: Secondary | ICD-10-CM | POA: Diagnosis not present

## 2016-04-18 DIAGNOSIS — G8929 Other chronic pain: Secondary | ICD-10-CM

## 2016-04-18 DIAGNOSIS — R011 Cardiac murmur, unspecified: Secondary | ICD-10-CM | POA: Diagnosis not present

## 2016-04-18 DIAGNOSIS — I509 Heart failure, unspecified: Secondary | ICD-10-CM | POA: Diagnosis not present

## 2016-04-18 DIAGNOSIS — I130 Hypertensive heart and chronic kidney disease with heart failure and stage 1 through stage 4 chronic kidney disease, or unspecified chronic kidney disease: Secondary | ICD-10-CM | POA: Insufficient documentation

## 2016-04-18 DIAGNOSIS — M47817 Spondylosis without myelopathy or radiculopathy, lumbosacral region: Secondary | ICD-10-CM

## 2016-04-18 DIAGNOSIS — M545 Low back pain: Secondary | ICD-10-CM | POA: Diagnosis present

## 2016-04-18 DIAGNOSIS — M5136 Other intervertebral disc degeneration, lumbar region: Secondary | ICD-10-CM | POA: Diagnosis not present

## 2016-04-18 NOTE — Progress Notes (Signed)
Safety precautions to be maintained throughout the outpatient stay will include: orient to surroundings, keep bed in low position, maintain call bell within reach at all times, provide assistance with transfer out of bed and ambulation.  

## 2016-04-18 NOTE — Telephone Encounter (Signed)
Pt c/o swelling: STAT is pt has developed SOB within 24 hours  1. How long have you been experiencing swelling? Couple of days  2. Where is the swelling located? One leg, right leg.   3.  Are you currently taking a "fluid pill"? Yes   4.  Are you currently SOB? No   5.  Have you traveled recently? No   Pt wife calling stating leg is "seeping" not sure what to do  Please advise.

## 2016-04-18 NOTE — Telephone Encounter (Signed)
Spoke w/ pt's wife.  She reports that pt has b/l leg swelling since drinking a Powerade. Advised her to have pt take an extra lasix today, wear his compression hose or ACE bandage and keep his feet elevated. Advised her to limit pt's fluids, have him drink only when he is thirsty, and to use ice chips if his mouth is dry to avoid the volume of fluids.  She held the phone in the air so I could tell pt to stay away from sports drinks; she reports that he "sneaks them". Pt denies SOB. Asked her to have pt call back tomorrow if sx have not improved. They are calling for bad weather tonight, advised her that there is always someone on call, to call this # after hours if needed. She verbalizes understanding and is appreciative.

## 2016-04-18 NOTE — Patient Instructions (Signed)
Please make sure you get your lab work done, xrays, and Psychlogical exam. As Soon as you complete your Psych. Exam call [facility] to make second appointment.

## 2016-04-19 NOTE — Progress Notes (Signed)
Subjective:  Patient ID: Nathan Kramer, male    DOB: 06/12/1935  Age: 81 y.o. MRN: [id number]  CC: Back Pain (lower, bilateral) and Leg Pain (bilateral.  )      PROCEDURE:None  HPI Nathan Kramer presents for a new patient evaluation. He has been previously followed by Dr. Belenda Cruise crisp. He has a long-standing history of low back pain and thoracic pain lower extremity pain that is been present for many years. It does not appear to be influenced by time of day but his worst VAS score is a 6 and best is a 4 and appears to be in pain throughout the day. This pain is aggravated by any type of activity or movement and prolonged standing or walking. Rest seems to help alleviate pain as does sleeping and medication management. He has been on MS Contin for a long time and this is well tolerated and keeps his pain under good control he reports today. He also uses occasional oxycodone 10 mg tablets broken in half for periodic breakthrough pain. Description of the pain is sharp shooting uncomfortable deep an dreadful in nature and present in the regions as described above. He has had previous neurologic evaluations MRIs CT scans and x-rays. Facet blocks and epidural injections have been helpful to yield some temporizing pain relief. Unfortunately despite conservative therapy he has required chronic opioid maintenance therapy for pain relief. The practitioner database report was reviewed by me today and was found to be acceptable.  History Nathan Kramer has a past medical history of Anemia; CHF (congestive heart failure) ([place]) (01/2016); Other chronic pain; Prostate cancer ([place]); Reflux esophagitis; Stroke [facility]); Undiagnosed cardiac murmurs; Unspecified essential hypertension; and Unspecified visual loss.   He has a past surgical history that includes back surgery and Colonoscopy.   His family history includes Cancer in his sister; Diabetes in his mother; Heart disease in his brother and father; Hypertension  in his father and mother.He reports that he has been smoking Cigars.  He has a 75.00 pack-year smoking history. He has never used smokeless tobacco. He reports that he does not drink alcohol or use drugs.  Results for orders placed in visit on 04/24/13  [facility] W/O Cm   Narrative * PRIOR REPORT IMPORTED FROM AN EXTERNAL SYSTEM *   CLINICAL DATA:  Low back and bilateral hip pain. History of prior  back surgery.   EXAM:  MRI LUMBAR SPINE WITHOUT CONTRAST   TECHNIQUE:  Multiplanar, multisequence MR imaging was performed. No intravenous  contrast was administered.   COMPARISON:  None.   FINDINGS:  Advanced degenerative lumbar spondylosis with multilevel disc  disease and facet disease. The vertebral bodies demonstrate normal  marrow signal except for endplate reactive changes. A few small  scattered hemangiomas are also noted. The last full intervertebral  disc space is labeled L5-S1 and the conus medullaris terminates at  T12-L1. The facets are normally aligned. Advanced facet disease but  no definite pars defects.   No significant paraspinal or retroperitoneal process is identified.  There are multiple bilateral renal cysts noted. The the spleen may  be enlarged. Ultrasound may be helpful for exact measurements.   L1-2: Mild annular bulge. No significant spinal or foraminal  stenosis. Mild lateral recess encroachment bilaterally.   L2-3: Advanced degenerative disc disease with marked disc space  narrowing and osteophytic spurring. There is also moderate facet  disease. There is moderate bilateral lateral recess stenosis and  mild bilateral foraminal stenosis. No significant spinal  stenosis.   L3-4: Diffuse bulging annulus, osteophytic ridging and advanced  facet disease contributing to moderate to moderately severe  bilateral lateral recess stenosis and mild right foraminal stenosis.  Early spinal stenosis.   L4-5: Broad-based bulging and slightly uncovered disc with   osteophytic spurring, short pedicles and advanced facet disease  contributing to mild spinal stenosis and moderate to moderately  severe bilateral lateral recess stenosis and moderate bilateral  foraminal stenosis.   L5-S1: Advanced facet disease and bulging annulus with early spinal  stenosis, moderate bilateral lateral recess stenosis and mild  bilateral foraminal stenosis.   IMPRESSION:  Advanced degenerative lumbar spondylosis with multilevel disc  disease and facet disease. There is multilevel multifactorial  spinal, lateral recess and foraminal stenosis as discussed above at  the individual levels.    Electronically Signed    By: Kalman Jewels M.D.    On: 04/24/2013 10:24        No results found for: TOXASSSELUR  Outpatient Medications Prior to Visit  Medication Sig Dispense Refill  . diphenhydrAMINE (BENADRYL) 25 MG tablet Take 25 mg by mouth as needed.     [name] esomeprazole (NEXIUM) 10 MG packet Take 20 mg by mouth daily before breakfast.    . furosemide (LASIX) 20 MG tablet Take 2 tablets (40 mg total) by mouth 2 (two) times daily. 360 tablet 3  . HYDROcodone-acetaminophen (NORCO) 10-325 MG tablet Limit 1 tablet by mouth 2-4 times per day for breakthrough pain while taking MS Contin if tolerated 120 tablet 0  . KLOR-CON M20 20 MEQ tablet TAKE 1 TABLET (20 MEQ TOTAL) BY MOUTH DAILY. 90 tablet 3  . morphine (MS CONTIN) 30 MG 12 hr tablet Limit one tab by mouth every  8 - 12  hours if tolerated 90 tablet 0   No facility-administered medications prior to visit.    Lab Results  Component Value Date   WBC 17.8 (H) 01/16/2016   HGB 12.2 (L) 01/16/2016   HCT 41.4 01/16/2016   PLT 266 01/16/2016   GLUCOSE 108 (H) 03/30/2016   ALT 9 (L) 01/11/2016   AST 31 01/11/2016   NA 139 03/30/2016   K 4.9 03/30/2016   CL 98 03/30/2016   CREATININE 1.77 (H) 03/30/2016   BUN 28 (H) 03/30/2016   CO2 23 03/30/2016     --------------------------------------------------------------------------------------------------------------------- Ct Abdomen Pelvis Wo Contrast  Result Date: 01/11/2016 CLINICAL DATA:  81 year old male with a history of lower extremity edema. EXAM: CT ABDOMEN AND PELVIS WITHOUT CONTRAST TECHNIQUE: Multidetector CT imaging of the abdomen and pelvis was performed following the standard protocol without IV contrast. Patient is position on his right side for comfort COMPARISON:  MR lumbar spine 04/24/2013, CT chest 10/29/2015 FINDINGS: Lower chest: Calcifications/stents within coronary arteries. Trace pericardial fluid/thickening, unchanged from comparison CT. Calcifications of the aortic valve. Sub solid nodule of the left lower lobe again demonstrated, not significantly changed in size or configuration from the comparison. Right middle lobe sub solid nodule also visualized. Similar appearance of linear scarring/atelectasis at the lung bases. Early bronchiectasis. Trace pleural fluid bilaterally. Hepatobiliary: Unremarkable appearance of liver. Unremarkable appearance of the gallbladder. No intrahepatic or extrahepatic biliary ductal dilatation. Pancreas: Atrophic pancreatic parenchyma. No significant calcifications. Spleen: Greatest diameter of the spleen measures over 17 cm. Adrenals/Urinary Tract: Unremarkable appearance of the adrenal glands. Right: Nonobstructive stone within the right collecting system measuring 7 mm. Stone or a vascular calcification at the inferior right renal cortex measuring 10 mm. No right-sided hydronephrosis. Multiple rounded hypodense  lesions of the right kidney cortex, some which are too small to characterize. Left: Small nonobstructive stones versus vascular calcifications at the inferior left collecting system measuring 3 mm - 4 mm. No left hydronephrosis. Multiple rounded low-density cystic lesions on the left. Largest on the anterior lateral cortex measures 5 cm. The  smallest are too small to characterize by CT. Stomach/Bowel: Unremarkable appearance of stomach. Inflammatory changes and adjacent to the proximal duodenum. Probable duodenum diverticulum. No abnormally distended small bowel. Normal appendix. No evidence of colonic obstruction. Colonic diverticular disease. No associated inflammatory changes. Mesenteric: Free fluid layered over the dome of the liver, extending into the hilum of the liver and into the right pericolic gutter. Note that the patient is position on is right for this CT. Fluid layered dependently within the anatomic pelvis. Vascular/Lymphatic: Hazy infiltration of the small bowel mesenteric. Small lymph nodes present. No significantly enlarged lymph nodes. Scattered calcifications of the abdominal aorta.  No aneurysm. Reproductive: Calcifications of the prostate. Other: Small fact containing umbilical hernia. Musculoskeletal: No displaced fracture. Multilevel degenerative changes of the thoracolumbar spine. Bilateral facet disease of the lower lumbar levels. No significant degenerative changes of the hips. IMPRESSION: Compared to CT of the chest 10/21/2015, there has been interval development of small volume free fluid within the upper abdomen and infiltration of the mesentery. The etiology of the free fluid is uncertain, and can be related to generalized edema/ anasarca and a positive fluid balance, although reactive process within the abdomen could also contribute to development of free fluid. If there is concern for acute intra-abdominal process, contrast-enhanced CT may be useful. Cardiomegaly and small bilateral pleural effusions. Splenomegaly. Aortic atherosclerosis. Coronary artery disease. Aortic valve calcifications. Bilateral renal cysts, which are most likely benign though not fully characterize on this noncontrast study. Bilateral nonobstructive nephrolithiasis. Diverticular disease without evidence of acute diverticulitis. Sub solid nodules  of the left lower lobe and right middle lobe. As was noted on prior chest CT, a follow-up chest CT between 3 and 6 months (after 10/29/2015) is recommended. Signed, Dulcy Fanny. Earleen Newport, DO [facility] Electronically Signed   By: Corrie Mckusick D.O.   On: 01/11/2016 14:20   Dg Chest 2 View  Result Date: 01/12/2016 CLINICAL DATA:  Acute CHF, anasarca, CKD, moderate aortic stenosis, HTN, SOB, hx reflux esophagitis, stroke, cardiac murmurs, smoker, no prev surg EXAM: CHEST  2 VIEW COMPARISON:  01/11/2016 FINDINGS: Cardiac silhouette is mildly enlarged. There is a small hiatal hernia. No mediastinal or hilar masses or convincing adenopathy. There is elevation right hemidiaphragm. Linear opacities noted at the right lung base consistent with scarring or atelectasis. Minor scar or atelectasis is noted at the left lateral lung base. There is a focal area of pleural thickening along the right lateral mid hemi thorax. Remainder of the lungs is clear. No convincing pleural effusion. No pneumothorax. Skeletal structures are demineralized but grossly intact. IMPRESSION: No acute cardiopulmonary disease. No change from the prior radiographs. Electronically Signed   By: Lajean Manes M.D.   On: 01/12/2016 09:28   Dg Chest 2 View  Result Date: 01/11/2016 CLINICAL DATA:  Shortness of breath EXAM: CHEST  2 VIEW COMPARISON:  10/29/2015 FINDINGS: Volume loss and pleural thickening on the right. There is scarring in the bases as noted previously. There is no edema, consolidation, effusion, or pneumothorax. Chronic cardiomegaly. Aortic tortuosity. IMPRESSION: 1. No acute finding. 2. Nodular opacity in the left lower lobe by July 2017 chest CT has not  been visible by radiography. 3. Chronic right pleural thickening and volume loss. Mild basilar scarring. Electronically Signed   By: Monte Fantasia M.D.   On: 01/11/2016 11:29        ---------------------------------------------------------------------------------------------------------------------- Past Medical History:  Diagnosis Date  . Anemia   . CHF (congestive heart failure) ([place]) 01/2016  . Other chronic pain   . Prostate cancer ([place])   . Reflux esophagitis   . Stroke ([place])   . Undiagnosed cardiac murmurs   . Unspecified essential hypertension   . Unspecified visual loss     Past Surgical History:  Procedure Laterality Date  . back surgery    . COLONOSCOPY      Family History  Problem Relation Age of Onset  . Diabetes Mother   . Hypertension Mother   . Hypertension Father   . Heart disease Father   . Cancer Sister   . Heart disease Brother     Social History  Substance Use Topics  . Smoking status: Current Some Day Smoker    Packs/day: 1.50    Years: 50.00    Types: Cigars  . Smokeless tobacco: Never Used  . Alcohol use No    ---------------------------------------------------------------------------------------------------------------------- Social History   Social History  . Marital status: Married    Spouse name: N/A  . Number of children: N/A  . Years of education: N/A   Social History Main Topics  . Smoking status: Current Some Day Smoker    Packs/day: 1.50    Years: 50.00    Types: Cigars  . Smokeless tobacco: Never Used  . Alcohol use No  . Drug use: No  . Sexual activity: Not Asked   Other Topics Concern  . None   Social History Narrative  . None    Scheduled Meds: Continuous Infusions: PRN Meds:.   BP (!) 118/58 (BP Location: Left Arm, Patient Position: Sitting, Cuff Size: Normal)   Pulse 87   Temp 97.7 F (36.5 C) (Oral)   Resp 16   Ht 5\' 8"  (1.727 m)   Wt 150 lb (68 kg)   SpO2 98%   BMI 22.81 kg/m    BP Readings from Last 3 Encounters:  04/18/16 (!) 118/58  03/02/16 118/70  01/27/16 (!) 110/50     Wt Readings from Last 3 Encounters:  04/18/16 150 lb (68 kg)  03/02/16 145 lb  12 oz (66.1 kg)  01/27/16 150 lb 12 oz (68.4 kg)     ----------------------------------------------------------------------------------------------------------------------  ROS Review of Systems  The full review of systems was reviewed in detail. Cardiac: No angina Pulmonary: No shortness of breath Neurologic: History of stroke and blindness Psychiatric: No suicidal or homicidal ideation or history of drug abuse reported GU: Kidney stones  Objective:  BP (!) 118/58 (BP Location: Left Arm, Patient Position: Sitting, Cuff Size: Normal)   Pulse 87   Temp 97.7 F (36.5 C) (Oral)   Resp 16   Ht 5\' 8"  (1.727 m)   Wt 150 lb (68 kg)   SpO2 98%   BMI 22.81 kg/m   Physical Exam Pupils are equally round reactive to light Extraocular muscles are intact Heart is regular rate and rhythm with a 3/6 systolic ejection murmur Lungs are distantly clear without wheezing Profound tenderness in the paraspinous muscles in the lumbar region His strength appears to be intact to the lower extremities both proximal and distal     Assessment & Plan:   Orman was seen today for back pain and leg pain.  Diagnoses  and all orders for this visit:  DDD (degenerative disc disease), cervical -     ToxASSURE Select 13 (MW), Urine  DDD (degenerative disc disease), lumbar -     ToxASSURE Select 13 (MW), Urine  Primary osteoarthritis of both knees -     ToxASSURE Select 13 (MW), Urine -     ToxASSURE Select 13 (MW), Urine  Lumbosacral facet joint syndrome -     ToxASSURE Select 13 (MW), Urine -     ToxASSURE Select 13 (MW), Urine  Spinal stenosis, lumbar region, with neurogenic claudication -     ToxASSURE Select 13 (MW), Urine -     ToxASSURE Select 13 (MW), Urine  Chronic bilateral low back pain with bilateral sciatica -     ToxASSURE Select 13 (MW), Urine -     ToxASSURE Select 13 (MW),  Urine     ----------------------------------------------------------------------------------------------------------------------  Problem List Items Addressed This Visit      Musculoskeletal and Integument   DDD (degenerative disc disease), cervical - Primary   Relevant Orders   ToxASSURE Select 13 (MW), Urine   DDD (degenerative disc disease), lumbar   Relevant Orders   ToxASSURE Select 13 (MW), Urine   DJD (degenerative joint disease) of knee   Relevant Orders   ToxASSURE Select 13 (MW), Urine   ToxASSURE Select 13 (MW), Urine     Other   Lumbosacral facet joint syndrome   Relevant Orders   ToxASSURE Select 13 (MW), Urine   ToxASSURE Select 13 (MW), Urine   Spinal stenosis, lumbar region, with neurogenic claudication   Relevant Orders   ToxASSURE Select 13 (MW), Urine   ToxASSURE Select 13 (MW), Urine    Other Visit Diagnoses    Chronic bilateral low back pain with bilateral sciatica       Relevant Orders   ToxASSURE Select 13 (MW), Urine   ToxASSURE Select 13 (MW), Urine      ----------------------------------------------------------------------------------------------------------------------  1. DDD (degenerative disc disease), cervical For the above-mentioned problems and his diffuse body pain, we will continue his current medication management with the MS Contin 30 mg 3 times a day. I will not prescribe this until we get his urine drug screen back with a return to clinic in approximately 2-4 weeks. I have discussed considerations for diminishing his overall exposure to some of these medications with attempts to eliminate oxycodone for breakthrough pain and a possible desire to reduce his dosing on the MS Contin over the next several months if tolerated. He has a very difficult condition with severe degenerative arthritis however the medications based on his spots improve his lifestyle and function with minimal side effects. - ToxASSURE Select 13 (MW), Urine  2. DDD  (degenerative disc disease), lumbar  - ToxASSURE Select 13 (MW), Urine  3. Primary osteoarthritis of both knees  - ToxASSURE Select 13 (MW), Urine - ToxASSURE Select 13 (MW), Urine  4. Lumbosacral facet joint syndrome  - ToxASSURE Select 13 (MW), Urine - ToxASSURE Select 13 (MW), Urine  5. Spinal stenosis, lumbar region, with neurogenic claudication  - ToxASSURE Select 13 (MW), Urine - ToxASSURE Select 13 (MW), Urine  6. Chronic bilateral low back pain with bilateral sciatica  - ToxASSURE Select 13 (MW), Urine - ToxASSURE Select 13 (MW), Urine    ----------------------------------------------------------------------------------------------------------------------  I am having Nathan Kramer maintain his esomeprazole, diphenhydrAMINE, morphine, HYDROcodone-acetaminophen, furosemide, KLOR-CON M20, Ferrous Gluconate (IRON 27 PO), and Polyethylene Glycol 3350 (CLEARLAX PO).   Meds ordered this encounter  Medications  . Ferrous  Gluconate (IRON 27 PO)    Sig: Take 1 tablet by mouth daily.  . Polyethylene Glycol 3350 (CLEARLAX PO)    Sig: Take 1 packet by mouth daily.       Follow-up: Return in about 2 weeks (around 05/02/2016) for evaluation, med refill.    Molli Barrows, MD   The [place] practitioner database for opioid medications on this patient has been reviewed by me and my staff   Greater than 50% of the total encounter time was spent in counseling and / or coordination of care.     This dictation was performed utilizing Systems analyst.  Please excuse any unintentional or mistaken typographical errors as a result.

## 2016-04-24 ENCOUNTER — Telehealth: Payer: Self-pay | Admitting: Cardiology

## 2016-04-24 NOTE — Telephone Encounter (Signed)
Patients wife called reporting that his legs are weeping and wanted to know if we could order something for that. Let her know that she should check with his primary care physician for recommendations. Let her know that he could wrap gauze and ace bandages to lower legs to help with the swelling and drainage. Let her know to call back if symptoms persist or worsen. She verbalized understanding of our conversation and had no further questions at this time.

## 2016-04-24 NOTE — Telephone Encounter (Signed)
Pt spouse calling stating pt legs are "seeping" again Swelling on legs have gone down  It is "seeping" from the right leg. Cephalexin may help, she said her daughter is a Marine scientist and she suggested that we look into this for patient. Please advise.

## 2016-04-25 LAB — TOXASSURE SELECT 13 (MW), URINE

## 2016-05-09 ENCOUNTER — Encounter: Payer: Self-pay | Admitting: Anesthesiology

## 2016-05-09 ENCOUNTER — Ambulatory Visit: Payer: Medicare Other | Attending: Anesthesiology | Admitting: Anesthesiology

## 2016-05-09 VITALS — BP 115/57 | HR 88 | Temp 97.5°F | Resp 16 | Ht 68.0 in | Wt 158.0 lb

## 2016-05-09 DIAGNOSIS — M546 Pain in thoracic spine: Secondary | ICD-10-CM | POA: Diagnosis not present

## 2016-05-09 DIAGNOSIS — I350 Nonrheumatic aortic (valve) stenosis: Secondary | ICD-10-CM | POA: Diagnosis not present

## 2016-05-09 DIAGNOSIS — D649 Anemia, unspecified: Secondary | ICD-10-CM | POA: Insufficient documentation

## 2016-05-09 DIAGNOSIS — I130 Hypertensive heart and chronic kidney disease with heart failure and stage 1 through stage 4 chronic kidney disease, or unspecified chronic kidney disease: Secondary | ICD-10-CM | POA: Insufficient documentation

## 2016-05-09 DIAGNOSIS — M79605 Pain in left leg: Secondary | ICD-10-CM | POA: Insufficient documentation

## 2016-05-09 DIAGNOSIS — G8929 Other chronic pain: Secondary | ICD-10-CM | POA: Diagnosis not present

## 2016-05-09 DIAGNOSIS — F17210 Nicotine dependence, cigarettes, uncomplicated: Secondary | ICD-10-CM | POA: Insufficient documentation

## 2016-05-09 DIAGNOSIS — M5134 Other intervertebral disc degeneration, thoracic region: Secondary | ICD-10-CM

## 2016-05-09 DIAGNOSIS — M545 Low back pain: Secondary | ICD-10-CM | POA: Diagnosis present

## 2016-05-09 DIAGNOSIS — M5441 Lumbago with sciatica, right side: Secondary | ICD-10-CM

## 2016-05-09 DIAGNOSIS — M79604 Pain in right leg: Secondary | ICD-10-CM | POA: Insufficient documentation

## 2016-05-09 DIAGNOSIS — I509 Heart failure, unspecified: Secondary | ICD-10-CM | POA: Insufficient documentation

## 2016-05-09 DIAGNOSIS — K210 Gastro-esophageal reflux disease with esophagitis: Secondary | ICD-10-CM | POA: Insufficient documentation

## 2016-05-09 DIAGNOSIS — M1288 Other specific arthropathies, not elsewhere classified, other specified site: Secondary | ICD-10-CM

## 2016-05-09 DIAGNOSIS — M48062 Spinal stenosis, lumbar region with neurogenic claudication: Secondary | ICD-10-CM | POA: Diagnosis not present

## 2016-05-09 DIAGNOSIS — M5442 Lumbago with sciatica, left side: Secondary | ICD-10-CM

## 2016-05-09 DIAGNOSIS — Z8546 Personal history of malignant neoplasm of prostate: Secondary | ICD-10-CM | POA: Diagnosis not present

## 2016-05-09 DIAGNOSIS — Z8673 Personal history of transient ischemic attack (TIA), and cerebral infarction without residual deficits: Secondary | ICD-10-CM | POA: Diagnosis not present

## 2016-05-09 DIAGNOSIS — M47817 Spondylosis without myelopathy or radiculopathy, lumbosacral region: Secondary | ICD-10-CM

## 2016-05-09 DIAGNOSIS — M170 Bilateral primary osteoarthritis of knee: Secondary | ICD-10-CM

## 2016-05-09 MED ORDER — MORPHINE SULFATE ER 30 MG PO TBCR: 30.0000 mg | EXTENDED_RELEASE_TABLET | Freq: Three times a day (TID) | ORAL | 0 refills | Status: DC

## 2016-05-09 NOTE — Progress Notes (Signed)
Safety precautions to be maintained throughout the outpatient stay will include: orient to surroundings, keep bed in low position, maintain call bell within reach at all times, provide assistance with transfer out of bed and ambulation.  

## 2016-05-10 NOTE — Progress Notes (Signed)
Subjective:  Patient ID: Nathan Kramer, male    DOB: 12-Sep-1935  Age: 81 y.o. MRN: [id number]  CC: Back Pain (lower bilateral)   Service Provided on Last Visit: Evaluation  PROCEDURE:None  HPI Nathan Kramer is here for a reevaluation today. He was last seen 1 month ago and has been doing reasonably well with his medication regimen. No new changes are reported in the quality characteristic or distribution of his low back pain. His pain complaints of been stable and based on his narcotic assessment sheet he is tolerating his medications well. We have also reviewed the practitioner database and it is appropriate. presents for a new patient evaluation. He reports an improved overall lifestyle and function with his medications and unfortunately his pain has been quite recalcitrant and unresponsive to conservative therapy.  He has a long-standing history of low back pain and thoracic pain lower extremity pain that is been present for many years. It does not appear to be influenced by time of day but his worst VAS score is a 6 and best is a 4 and appears to be in pain throughout the day. This pain is aggravated by any type of activity or movement and prolonged standing or walking. Rest seems to help alleviate pain as does sleeping and medication management. He has been on MS Contin for a long time and this is well tolerated and keeps his pain under good control he reports today. He also uses occasional oxycodone 10 mg tablets broken in half for periodic breakthrough pain. Description of the pain is sharp shooting uncomfortable deep an dreadful in nature and present in the regions as described above. He has had previous neurologic evaluations MRIs CT scans and x-rays. Facet blocks and epidural injections have been helpful to yield some temporizing pain relief. Unfortunately despite conservative therapy he has required chronic opioid maintenance therapy for pain relief. The practitioner database report  was reviewed by me today and was found to be acceptable.  History Nathan Kramer has a past medical history of Anemia; CHF (congestive heart failure) ([place]) (01/2016); Other chronic pain; Prostate cancer ([place]); Reflux esophagitis; Stroke [facility]); Undiagnosed cardiac murmurs; Unspecified essential hypertension; and Unspecified visual loss.   He has a past surgical history that includes back surgery and Colonoscopy.   His family history includes Cancer in his sister; Diabetes in his mother; Heart disease in his brother and father; Hypertension in his father and mother.He reports that he has been smoking Cigars.  He has a 75.00 pack-year smoking history. He has never used smokeless tobacco. He reports that he does not drink alcohol or use drugs.  Results for orders placed in visit on 04/24/13  [facility] W/O Cm   Narrative * PRIOR REPORT IMPORTED FROM AN EXTERNAL SYSTEM *   CLINICAL DATA:  Low back and bilateral hip pain. History of prior  back surgery.   EXAM:  MRI LUMBAR SPINE WITHOUT CONTRAST   TECHNIQUE:  Multiplanar, multisequence MR imaging was performed. No intravenous  contrast was administered.   COMPARISON:  None.   FINDINGS:  Advanced degenerative lumbar spondylosis with multilevel disc  disease and facet disease. The vertebral bodies demonstrate normal  marrow signal except for endplate reactive changes. A few small  scattered hemangiomas are also noted. The last full intervertebral  disc space is labeled L5-S1 and the conus medullaris terminates at  T12-L1. The facets are normally aligned. Advanced facet disease but  no definite pars defects.   No significant paraspinal or  retroperitoneal process is identified.  There are multiple bilateral renal cysts noted. The the spleen may  be enlarged. Ultrasound may be helpful for exact measurements.   L1-2: Mild annular bulge. No significant spinal or foraminal  stenosis. Mild lateral recess encroachment bilaterally.   L2-3:  Advanced degenerative disc disease with marked disc space  narrowing and osteophytic spurring. There is also moderate facet  disease. There is moderate bilateral lateral recess stenosis and  mild bilateral foraminal stenosis. No significant spinal stenosis.   L3-4: Diffuse bulging annulus, osteophytic ridging and advanced  facet disease contributing to moderate to moderately severe  bilateral lateral recess stenosis and mild right foraminal stenosis.  Early spinal stenosis.   L4-5: Broad-based bulging and slightly uncovered disc with  osteophytic spurring, short pedicles and advanced facet disease  contributing to mild spinal stenosis and moderate to moderately  severe bilateral lateral recess stenosis and moderate bilateral  foraminal stenosis.   L5-S1: Advanced facet disease and bulging annulus with early spinal  stenosis, moderate bilateral lateral recess stenosis and mild  bilateral foraminal stenosis.   IMPRESSION:  Advanced degenerative lumbar spondylosis with multilevel disc  disease and facet disease. There is multilevel multifactorial  spinal, lateral recess and foraminal stenosis as discussed above at  the individual levels.    Electronically Signed    By: Kalman Jewels M.D.    On: 04/24/2013 10:24        ToxAssure Select 13  Date Value Ref Range Status  04/18/2016 FINAL  Final    Comment:    ==================================================================== TOXASSURE SELECT 13 (MW) ==================================================================== Test                             Result       Flag       Units Drug Present and Declared for Prescription Verification   Morphine                       [id number]       EXPECTED   ng/mg creat    Potential sources of large amounts of morphine in the absence of    codeine include administration of morphine or use of heroin.   Hydrocodone                    1736         EXPECTED   ng/mg creat   Hydromorphone                   427          EXPECTED   ng/mg creat   Dihydrocodeine                 174          EXPECTED   ng/mg creat   Norhydrocodone                 1005         EXPECTED   ng/mg creat    Sources of hydrocodone include scheduled prescription    medications. Hydromorphone, dihydrocodeine and norhydrocodone are    expected metabolites of hydrocodone. Hydromorphone and    dihydrocodeine are also available as scheduled prescription    medications. Drug Present not Declared for Prescription Verification   Methamphetamine                82  UNEXPECTED ng/mg creat    Sources of methamphetamine include illicit sources, as a    scheduled prescription medication, as a metabolite of some    prescription drugs, or use of an l-methamphetamine inhaler. ==================================================================== Test                      Result    Flag   Units      Ref Range   Creatinine              78               mg/dL      >=20 ==================================================================== Declared Medications:  The flagging and interpretation on this report are based on the  following declared medications.  Unexpected results may arise from  inaccuracies in the declared medications.  **Note: The testing scope of this panel includes these medications:  Hydrocodone (Norco)  Morphine (MS Contin)  **Note: The testing scope of this panel does not include following  reported medications:  Acetaminophen (Norco)  Diphenhydramine (Benadryl)  Furosemide (Lasix)  Omeprazole (Nexium)  Potassium (Klor-Con) ==================================================================== For clinical consultation, please call [phone]. ====================================================================     Outpatient Medications Prior to Visit  Medication Sig Dispense Refill  . diphenhydrAMINE (BENADRYL) 25 MG tablet Take 25 mg by mouth as needed.     [name] esomeprazole (NEXIUM) 10 MG  packet Take 20 mg by mouth daily before breakfast.    . Ferrous Gluconate (IRON 27 PO) Take 1 tablet by mouth daily.    . furosemide (LASIX) 20 MG tablet Take 2 tablets (40 mg total) by mouth 2 (two) times daily. 360 tablet 3  . HYDROcodone-acetaminophen (NORCO) 10-325 MG tablet Limit 1 tablet by mouth 2-4 times per day for breakthrough pain while taking MS Contin if tolerated 120 tablet 0  . KLOR-CON M20 20 MEQ tablet TAKE 1 TABLET (20 MEQ TOTAL) BY MOUTH DAILY. 90 tablet 3  . Polyethylene Glycol 3350 (CLEARLAX PO) Take 1 packet by mouth daily.    [name] morphine (MS CONTIN) 30 MG 12 hr tablet Limit one tab by mouth every  8 - 12  hours if tolerated 90 tablet 0   No facility-administered medications prior to visit.    Lab Results  Component Value Date   WBC 17.8 (H) 01/16/2016   HGB 12.2 (L) 01/16/2016   HCT 41.4 01/16/2016   PLT 266 01/16/2016   GLUCOSE 108 (H) 03/30/2016   ALT 9 (L) 01/11/2016   AST 31 01/11/2016   NA 139 03/30/2016   K 4.9 03/30/2016   CL 98 03/30/2016   CREATININE 1.77 (H) 03/30/2016   BUN 28 (H) 03/30/2016   CO2 23 03/30/2016    --------------------------------------------------------------------------------------------------------------------- Ct Abdomen Pelvis Wo Contrast  Result Date: 01/11/2016 CLINICAL DATA:  81 year old male with a history of lower extremity edema. EXAM: CT ABDOMEN AND PELVIS WITHOUT CONTRAST TECHNIQUE: Multidetector CT imaging of the abdomen and pelvis was performed following the standard protocol without IV contrast. Patient is position on his right side for comfort COMPARISON:  MR lumbar spine 04/24/2013, CT chest 10/29/2015 FINDINGS: Lower chest: Calcifications/stents within coronary arteries. Trace pericardial fluid/thickening, unchanged from comparison CT. Calcifications of the aortic valve. Sub solid nodule of the left lower lobe again demonstrated, not significantly changed in size or configuration from the comparison. Right middle lobe  sub solid nodule also visualized. Similar appearance of linear scarring/atelectasis at the lung bases. Early bronchiectasis. Trace pleural fluid bilaterally. Hepatobiliary: Unremarkable appearance of liver. Unremarkable  appearance of the gallbladder. No intrahepatic or extrahepatic biliary ductal dilatation. Pancreas: Atrophic pancreatic parenchyma. No significant calcifications. Spleen: Greatest diameter of the spleen measures over 17 cm. Adrenals/Urinary Tract: Unremarkable appearance of the adrenal glands. Right: Nonobstructive stone within the right collecting system measuring 7 mm. Stone or a vascular calcification at the inferior right renal cortex measuring 10 mm. No right-sided hydronephrosis. Multiple rounded hypodense lesions of the right kidney cortex, some which are too small to characterize. Left: Small nonobstructive stones versus vascular calcifications at the inferior left collecting system measuring 3 mm - 4 mm. No left hydronephrosis. Multiple rounded low-density cystic lesions on the left. Largest on the anterior lateral cortex measures 5 cm. The smallest are too small to characterize by CT. Stomach/Bowel: Unremarkable appearance of stomach. Inflammatory changes and adjacent to the proximal duodenum. Probable duodenum diverticulum. No abnormally distended small bowel. Normal appendix. No evidence of colonic obstruction. Colonic diverticular disease. No associated inflammatory changes. Mesenteric: Free fluid layered over the dome of the liver, extending into the hilum of the liver and into the right pericolic gutter. Note that the patient is position on is right for this CT. Fluid layered dependently within the anatomic pelvis. Vascular/Lymphatic: Hazy infiltration of the small bowel mesenteric. Small lymph nodes present. No significantly enlarged lymph nodes. Scattered calcifications of the abdominal aorta.  No aneurysm. Reproductive: Calcifications of the prostate. Other: Small fact containing  umbilical hernia. Musculoskeletal: No displaced fracture. Multilevel degenerative changes of the thoracolumbar spine. Bilateral facet disease of the lower lumbar levels. No significant degenerative changes of the hips. IMPRESSION: Compared to CT of the chest 10/21/2015, there has been interval development of small volume free fluid within the upper abdomen and infiltration of the mesentery. The etiology of the free fluid is uncertain, and can be related to generalized edema/ anasarca and a positive fluid balance, although reactive process within the abdomen could also contribute to development of free fluid. If there is concern for acute intra-abdominal process, contrast-enhanced CT may be useful. Cardiomegaly and small bilateral pleural effusions. Splenomegaly. Aortic atherosclerosis. Coronary artery disease. Aortic valve calcifications. Bilateral renal cysts, which are most likely benign though not fully characterize on this noncontrast study. Bilateral nonobstructive nephrolithiasis. Diverticular disease without evidence of acute diverticulitis. Sub solid nodules of the left lower lobe and right middle lobe. As was noted on prior chest CT, a follow-up chest CT between 3 and 6 months (after 10/29/2015) is recommended. Signed, Dulcy Fanny. Earleen Newport, DO [facility] Electronically Signed   By: Corrie Mckusick D.O.   On: 01/11/2016 14:20   Dg Chest 2 View  Result Date: 01/12/2016 CLINICAL DATA:  Acute CHF, anasarca, CKD, moderate aortic stenosis, HTN, SOB, hx reflux esophagitis, stroke, cardiac murmurs, smoker, no prev surg EXAM: CHEST  2 VIEW COMPARISON:  01/11/2016 FINDINGS: Cardiac silhouette is mildly enlarged. There is a small hiatal hernia. No mediastinal or hilar masses or convincing adenopathy. There is elevation right hemidiaphragm. Linear opacities noted at the right lung base consistent with scarring or atelectasis. Minor scar or atelectasis is noted  at the left lateral lung base. There is a focal area of pleural thickening along the right lateral mid hemi thorax. Remainder of the lungs is clear. No convincing pleural effusion. No pneumothorax. Skeletal structures are demineralized but grossly intact. IMPRESSION: No acute cardiopulmonary disease. No change from the prior radiographs. Electronically Signed   By: Lajean Manes M.D.   On: 01/12/2016 09:28   Dg Chest 2 View  Result Date: 01/11/2016 CLINICAL DATA:  Shortness of breath EXAM: CHEST  2 VIEW COMPARISON:  10/29/2015 FINDINGS: Volume loss and pleural thickening on the right. There is scarring in the bases as noted previously. There is no edema, consolidation, effusion, or pneumothorax. Chronic cardiomegaly. Aortic tortuosity. IMPRESSION: 1. No acute finding. 2. Nodular opacity in the left lower lobe by July 2017 chest CT has not been visible by radiography. 3. Chronic right pleural thickening and volume loss. Mild basilar scarring. Electronically Signed   By: Monte Fantasia M.D.   On: 01/11/2016 11:29       ---------------------------------------------------------------------------------------------------------------------- Past Medical History:  Diagnosis Date  . Anemia   . CHF (congestive heart failure) ([place]) 01/2016  . Other chronic pain   . Prostate cancer ([place])   . Reflux esophagitis   . Stroke ([place])   . Undiagnosed cardiac murmurs   . Unspecified essential hypertension   . Unspecified visual loss     Past Surgical History:  Procedure Laterality Date  . back surgery    . COLONOSCOPY      Family History  Problem Relation Age of Onset  . Diabetes Mother   . Hypertension Mother   . Hypertension Father   . Heart disease Father   . Cancer Sister   . Heart disease Brother     Social History  Substance Use Topics  . Smoking status: Current Some Day Smoker    Packs/day: 1.50    Years: 50.00    Types: Cigars  . Smokeless tobacco: Never Used  . Alcohol use No     ---------------------------------------------------------------------------------------------------------------------- Social History   Social History  . Marital status: Married    Spouse name: N/A  . Number of children: N/A  . Years of education: N/A   Social History Main Topics  . Smoking status: Current Some Day Smoker    Packs/day: 1.50    Years: 50.00    Types: Cigars  . Smokeless tobacco: Never Used  . Alcohol use No  . Drug use: No  . Sexual activity: Not Asked   Other Topics Concern  . None   Social History Narrative  . None    Scheduled Meds: Continuous Infusions: PRN Meds:.   BP (!) 115/57 (BP Location: Right Arm, Patient Position: Sitting, Cuff Size: Normal)   Pulse 88   Temp 97.5 F (36.4 C) (Oral)   Resp 16   Ht 5\' 8"  (1.727 m)   Wt 158 lb (71.7 kg)   SpO2 97%   BMI 24.02 kg/m    BP Readings from Last 3 Encounters:  05/09/16 (!) 115/57  04/18/16 (!) 118/58  03/02/16 118/70     Wt Readings from Last 3 Encounters:  05/09/16 158 lb (71.7 kg)  04/18/16 150 lb (68 kg)  03/02/16 145 lb 12 oz (66.1 kg)     ----------------------------------------------------------------------------------------------------------------------  ROS Review of Systems  The full review of systems was reviewed in detail. GI: Rare mild constipation  Objective:  BP (!) 115/57 (BP Location: Right Arm, Patient Position: Sitting, Cuff Size: Normal)   Pulse 88   Temp 97.5 F (36.4 C) (Oral)   Resp 16   Ht 5\' 8"  (1.727 m)   Wt 158 lb (71.7 kg)   SpO2 97%   BMI 24.02 kg/m   Physical Exam Pupils are equally round reactive to light Extraocular muscles are intact Heart is regular rate and rhythm with a 3/6 systolic ejection murmur No changes on his lower extremity strength and function are noted l  Assessment & Plan:   Nathan Kramer was seen today for back pain.  Diagnoses and all orders for this visit:  Primary osteoarthritis of both  knees  Lumbosacral facet joint syndrome  Spinal stenosis, lumbar region, with neurogenic claudication  Chronic bilateral low back pain with bilateral sciatica  DDD (degenerative disc disease), thoracic  Other orders -     morphine (MS CONTIN) 30 MG 12 hr tablet; Take 1 tablet (30 mg total) by mouth 3 (three) times daily with meals.     ----------------------------------------------------------------------------------------------------------------------  Problem List Items Addressed This Visit      Musculoskeletal and Integument   DDD (degenerative disc disease), thoracic   Relevant Medications   morphine (MS CONTIN) 30 MG 12 hr tablet   DJD (degenerative joint disease) of knee - Primary   Relevant Medications   morphine (MS CONTIN) 30 MG 12 hr tablet     Other   Lumbosacral facet joint syndrome   Relevant Medications   morphine (MS CONTIN) 30 MG 12 hr tablet   Spinal stenosis, lumbar region, with neurogenic claudication    Other Visit Diagnoses    Chronic bilateral low back pain with bilateral sciatica       Relevant Medications   morphine (MS CONTIN) 30 MG 12 hr tablet      ----------------------------------------------------------------------------------------------------------------------  1. DDD (degenerative disc disease), cervical We will refill his medications today. I have reviewed his urine tox screen with him. We have gone over his medication management in detail and we will initiate MS Contin 30 mg 3 times a day. He is to contact us should he have any untoward effect with the medication however this would be unlikely as he has been on this for a considerable period of time. Furthermore we will attempt to wean him from his short acting medications with an ultimate effort to reduce his total MS Contin dosage as well.   2. DDD (degenerative disc disease), lumbar  Return to clinic in 1 month for reevaluation. We have talked about possible epidural steroid  injections for any acute exacerbations and his pain.  3. Primary osteoarthritis of both knees  - ToxASSURE Select 13 (MW), Urine - ToxASSURE Select 13 (MW), Urine  4. Lumbosacral facet joint syndrome  - ToxASSURE Select 13 (MW), Urine - ToxASSURE Select 13 (MW), Urine  5. Spinal stenosis, lumbar region, with neurogenic claudication  - ToxASSURE Select 13 (MW), Urine - ToxASSURE Select 13 (MW), Urine  6. Chronic bilateral low back pain with bilateral sciatica  - ToxASSURE Select 13 (MW), Urine - ToxASSURE Select 13 (MW), Urine    ----------------------------------------------------------------------------------------------------------------------  I have changed Nathan Kramer's morphine. I am also having him maintain his esomeprazole, diphenhydrAMINE, HYDROcodone-acetaminophen, furosemide, KLOR-CON M20, Ferrous Gluconate (IRON 27 PO), and Polyethylene Glycol 3350 (CLEARLAX PO).   Meds ordered this encounter  Medications  . morphine (MS CONTIN) 30 MG 12 hr tablet    Sig: Take 1 tablet (30 mg total) by mouth 3 (three) times daily with meals.    Dispense:  90 tablet    Refill:  0       Follow-up: Return in about 1 month (around 06/06/2016) for evaluation, med refill.    Molli Barrows, MD   The [place] practitioner database for opioid medications on this patient has been reviewed by me and my staff   Greater than 50% of the total encounter time was spent in counseling and / or coordination of care.     This dictation was performed utilizing Dragon  voice recognition software.  Please excuse any unintentional or mistaken typographical errors as a result.

## 2016-05-26 ENCOUNTER — Telehealth: Payer: Self-pay | Admitting: *Deleted

## 2016-05-30 ENCOUNTER — Ambulatory Visit (INDEPENDENT_AMBULATORY_CARE_PROVIDER_SITE_OTHER): Payer: Medicare Other | Admitting: Cardiology

## 2016-05-30 ENCOUNTER — Encounter: Payer: Self-pay | Admitting: Cardiology

## 2016-05-30 VITALS — BP 110/56 | HR 88 | Ht 68.0 in | Wt 175.2 lb

## 2016-05-30 DIAGNOSIS — I351 Nonrheumatic aortic (valve) insufficiency: Secondary | ICD-10-CM

## 2016-05-30 DIAGNOSIS — I5033 Acute on chronic diastolic (congestive) heart failure: Secondary | ICD-10-CM | POA: Diagnosis not present

## 2016-05-30 DIAGNOSIS — I10 Essential (primary) hypertension: Secondary | ICD-10-CM

## 2016-05-30 DIAGNOSIS — I350 Nonrheumatic aortic (valve) stenosis: Secondary | ICD-10-CM | POA: Diagnosis not present

## 2016-05-30 MED ORDER — TORSEMIDE 20 MG PO TABS: 40.0000 mg | ORAL_TABLET | Freq: Every day | ORAL | 3 refills | Status: AC

## 2016-05-30 NOTE — Progress Notes (Signed)
IV catheter removed, intact, and dressing applied. Patient stable with signs of distress. Follow up information provided to patient and spouse.

## 2016-05-30 NOTE — Progress Notes (Signed)
Cardiology Office Note   Date:  05/30/2016   ID:  [name], DOB Dec 09, 1935, MRN [id number]  Referring Doctor:  Tracie Harrier, MD   Cardiologist:   Wende Bushy, MD   Reason for consultation:  Chief Complaint  Patient presents with  . other    1 month f/u c/o fluid retention, swollen abd, edema, legs weeping and sob. Meds reviewed verbally with pt.      History of Present Illness: Nathan Kramer is a 81 y.o. male who presents for Follow-up for CHF, aortic stenosis  Since last visit, patient has continued to see worsening of his legs welling. He also reports worsening of his shortness of breath. Walking short distance from [facility] makes him really short of breath. No chest pain. He sleeps in a recliner. No PND. Weight gain of almost 20 pounds since last visit.  Of note, his diuretics were changed from furosemide for TEE twice a day to torsemide 20 daily with the addition of metolazone 2.5 mg once a week from last visit with PCP 05/11/2016.  ROS:  Please see the history of present illness. Aside from mentioned under HPI, all other systems are reviewed and negative.    Past Medical History:  Diagnosis Date  . Anemia   . CHF (congestive heart failure) ([place]) 01/2016  . Other chronic pain   . Prostate cancer ([place])   . Reflux esophagitis   . Stroke ([place])   . Undiagnosed cardiac murmurs   . Unspecified essential hypertension   . Unspecified visual loss     Past Surgical History:  Procedure Laterality Date  . back surgery    . COLONOSCOPY       reports that he has been smoking Cigars.  He has a 75.00 pack-year smoking history. He has never used smokeless tobacco. He reports that he does not drink alcohol or use drugs.   family history includes Cancer in his sister; Diabetes in his mother; Heart disease in his brother and father; Hypertension in his father and mother.   Current Outpatient Prescriptions  Medication Sig Dispense Refill  . cephALEXin  (KEFLEX) 500 MG capsule Take 500 mg by mouth 3 (three) times daily.    . diphenhydrAMINE (BENADRYL) 25 MG tablet Take 25 mg by mouth as needed.     [name] esomeprazole (NEXIUM) 10 MG packet Take 20 mg by mouth daily before breakfast.    . Ferrous Gluconate (IRON 27 PO) Take 1 tablet by mouth daily.    [name] KLOR-CON M20 20 MEQ tablet TAKE 1 TABLET (20 MEQ TOTAL) BY MOUTH DAILY. 90 tablet 3  . levothyroxine (SYNTHROID, LEVOTHROID) 25 MCG tablet Take 25 mcg by mouth daily before breakfast.    . metolazone (ZAROXOLYN) 2.5 MG tablet Take 2.5 mg by mouth every 7 (seven) days.    [name] morphine (MS CONTIN) 30 MG 12 hr tablet Take 1 tablet (30 mg total) by mouth 3 (three) times daily with meals. 90 tablet 0  . Polyethylene Glycol 3350 (CLEARLAX PO) Take 1 packet by mouth daily.    [name] torsemide (DEMADEX) 20 MG tablet Take 2 tablets (40 mg total) by mouth daily. 60 tablet 3   No current facility-administered medications for this visit.     Allergies: Amoxicillin and Reglan [metoclopramide]    PHYSICAL EXAM: VS:  BP (!) 110/56 (BP Location: Left Arm, Patient Position: Sitting, Cuff Size: Normal)   Pulse 88   Ht 5\' 8"  (1.727 m)   Wt 175 lb 4 oz (  79.5 kg)   BMI 26.65 kg/m  , Body mass index is 26.65 kg/m. Wt Readings from Last 3 Encounters:  05/30/16 175 lb 4 oz (79.5 kg)  05/09/16 158 lb (71.7 kg)  04/18/16 150 lb (68 kg)    GENERAL:  well developed, well nourished, obese, not in acute distress HEENT: normocephalic, pink conjunctivae, anicteric sclerae, no xanthelasma, normal dentition, oropharynx clear NECK:  + neck vein engorgement, JVP elevated, carotid upstroke brisk and symmetric, no bruit, no thyromegaly, no lymphadenopathy LUNGS:  good respiratory effort, clear to auscultation bilaterally CV:  PMI not displaced, no thrills, no lifts, S1 and S2 within normal limits, no palpable S3 or S4, left ear murmur noted, no rubs, no gallops ABD:  Soft, nontender, nondistended, normoactive bowel sounds, no  abdominal aortic bruit, no hepatomegaly, no splenomegaly MS: nontender back, no kyphosis, no scoliosis, no joint deformities EXT:  2+ DP/PT pulses, +3 edema, no varicosities, no cyanosis, no clubbing SKIN: warm, nondiaphoretic, normal turgor, no ulcers NEUROPSYCH: alert, oriented to person, place, and time, sensory/motor grossly intact, normal mood, appropriate affect    Recent Labs: 01/11/2016: ALT 9; B Natriuretic Peptide 1,166.0 01/16/2016: Hemoglobin 12.2; Platelets 266 03/30/2016: BUN 28; Creatinine, Ser 1.77; Potassium 4.9; Sodium 139   Lipid Panel No results found for: CHOL, TRIG, HDL, CHOLHDL, VLDL, LDLCALC, LDLDIRECT   Other studies Reviewed:  EKG:  The ekg from 08/24/2015 was personally reviewed by me and it revealed sinus rhythm, 81 BPM. T wave inversions. Poor R-wave progression.  EKG from 12/27/2015 was personally reviewed by me and showed normal sinus rhythm, 79 BPM. T wave inversion. QT 400 ms, QTC 433 ms, within normal limits.  EKG from 05/30/2016 was personally reviewed by me and showed sinus rhythm, 88 BPM. Nonspecific ST-T wave changes. QT 320 ms, QTC 369 ms, within normal limits.  Additional studies/ records that were reviewed personally reviewed by me today include:  Echo 09/07/2015: Left ventricle: The cavity size was normal. There was mild  concentric hypertrophy. Systolic function was normal. The  estimated ejection fraction was in the range of 55% to 60%. Wall  motion was normal; there were no regional wall motion  abnormalities. Features are consistent with a pseudonormal left  ventricular filling pattern, with concomitant abnormal relaxation  and increased filling pressure (grade 2 diastolic dysfunction). - Aortic valve: There was moderate to severe stenosis. There was  moderate regurgitation. Mean gradient (S): 25 mm Hg. Valve area  (VTI): 0.84 cm^2. Valve area (Vmax): 0.81 cm^2. - Aorta: Ascending aortic diameter: 37 mm (S). - Mitral valve:  Calcified annulus. Mildly thickened, moderately  calcified leaflets . The findings are consistent with mild  stenosis. Valve area by continuity equation (using LVOT flow):  1.84 cm^2. - Left atrium: The atrium was mildly dilated. - Pulmonary arteries: Systolic pressure was moderately increased.  PA peak pressure: 55 mm Hg (S).  Aortic valve Value Reference Aortic valve peak velocity, S 313 cm/s --------- Aortic valve mean velocity, S 237 cm/s --------- Aortic valve VTI, S 72.2 cm --------- Aortic mean gradient, S 25 mm Hg --------- Aortic peak gradient, S 39 mm Hg --------- VTI ratio, LVOT/AV 0.31 --------- Aortic valve area, VTI 0.84 cm^2 --------- Aortic valve area/bsa, VTI 0.45 cm^2/m^2 --------- Velocity ratio, peak, LVOT/AV 0.32 --------- Aortic valve area, peak velocity 0.81 cm^2 --------- Aortic valve area/bsa, peak 0.43 cm^2/m^2 --------- velocity Velocity ratio, mean, LVOT/AV 0.28 --------- Aortic valve area, mean velocity 0.71 cm^2 --------- Aortic valve area/bsa, mean 0.38 cm^2/m^2 --------- velocity Aortic regurg  pressure half-time 302 ms ---------  Nuclear stress is 10/04/2015: Pharmacological myocardial perfusion imaging study with no significant  ischemia GI uptake artifact noted Normal wall motion, EF estimated at 50% (ejection fraction likely reduce secondary to artifact around the inferior wall) No EKG changes concerning for ischemia at peak stress or in recovery. Nonspecific ST abnormality noted at rest Low risk scan  Labs through PCP 05/11/2016: BUN 34, creatinine 1.9, potassium 4.7  ASSESSMENT AND  PLAN:  Aortic stenosis Aortic insufficiency  at the last office visit 03/02/2016:Previous medical records reports mild to moderate aortic stenosis  most recent echocardiogram reveals moderate severe aortic stenosis and moderate aortic insufficiency.  Peak aortic velocity is less than 4 m/s. Mean aortic is 41mmHg. Stress testing did not reveal any evidence of ischemia. Findings discussed with patient.  Recommend repeat echocardiogram at this point.   CHF, preserved ejection fraction,  acute on chronic Patient advised admission to the hospital for IV diuretics and close monitoring.  Patient adamant that he does not wish to be admitted right now. We've given him IV Lasix 40 mg total. He has been advised of risks of not being inpatient at this point. Patient verbalized understanding. He still does not wish to come into the hospital. Advised to get admitted through the ER  if his weight goes up further 2 pounds overnight. For now, increase Torsemide to 40mg  po qd.   Hypertension  recommendations the same as last office visit 03/02/2016: BP is well controlled. Continue monitoring BP. Will continue to hold off on metoprolol for now and advised patient to monitor blood pressure closely.   Current medicines are reviewed at length with the patient today.  The patient does not have concerns regarding medicines.  Labs/ tests ordered today include:  Orders Placed This Encounter  Procedures  . Basic metabolic panel  . ECHOCARDIOGRAM COMPLETE   Patient is high risk due to acute on chronic CHF, patient refusing to go to the hospital currently. He is aware that if his weight goes up further more than 2 pounds, he needs to come in to the hospital.  Disposition:   FU with undersigned  In 2 days  Signed, Wende Bushy, MD  05/30/2016 11:25 AM    [place]

## 2016-05-30 NOTE — Progress Notes (Signed)
Patient here in office and placed 22 g IV to right forearm with good blood return and flushed with no problems. Administered 40 mg IV lasix and patient monitored. Will schedule follow up with Dr. Yvone Neu and then draw some labs.

## 2016-05-30 NOTE — Patient Instructions (Addendum)
Medication Instructions:  Your physician has recommended you make the following change in your medication:  1. Increase Torsemide to 40 mg once a day.   Testing/Procedures: Your physician has requested that you have an echocardiogram. Echocardiography is a painless test that uses sound waves to create images of your heart. It provides your doctor with information about the size and shape of your heart and how well your heart's chambers and valves are working. This procedure takes approximately one hour. There are no restrictions for this procedure.   Follow-Up: Your physician recommends that you schedule a follow-up appointment on Thursday June 01, 2016 at 08:45AM with Dr. Yvone Neu.   If you have increase in weight tomorrow then you need to go to emergency room for evaluation.   It was a pleasure seeing you today here in the office. Please do not hesitate to give [place] a call back if you have any further questions. [place], BSN

## 2016-05-31 ENCOUNTER — Inpatient Hospital Stay
Admission: EM | Admit: 2016-05-31 | Discharge: 2016-06-15 | DRG: 291 | Disposition: A | Discharge status: Home Health | Payer: Medicare Other | Attending: Internal Medicine | Admitting: Internal Medicine

## 2016-05-31 ENCOUNTER — Emergency Department: Payer: Medicare Other

## 2016-05-31 ENCOUNTER — Encounter: Payer: Self-pay | Admitting: *Deleted

## 2016-05-31 ENCOUNTER — Telehealth: Payer: Self-pay | Admitting: Cardiology

## 2016-05-31 ENCOUNTER — Telehealth: Payer: Self-pay | Admitting: *Deleted

## 2016-05-31 DIAGNOSIS — L03119 Cellulitis of unspecified part of limb: Secondary | ICD-10-CM

## 2016-05-31 DIAGNOSIS — I5033 Acute on chronic diastolic (congestive) heart failure: Secondary | ICD-10-CM | POA: Diagnosis present

## 2016-05-31 DIAGNOSIS — R64 Cachexia: Secondary | ICD-10-CM | POA: Diagnosis present

## 2016-05-31 DIAGNOSIS — Z880 Allergy status to penicillin: Secondary | ICD-10-CM

## 2016-05-31 DIAGNOSIS — Z79891 Long term (current) use of opiate analgesic: Secondary | ICD-10-CM

## 2016-05-31 DIAGNOSIS — M6250 Muscle wasting and atrophy, not elsewhere classified, unspecified site: Secondary | ICD-10-CM | POA: Diagnosis present

## 2016-05-31 DIAGNOSIS — Z8249 Family history of ischemic heart disease and other diseases of the circulatory system: Secondary | ICD-10-CM

## 2016-05-31 DIAGNOSIS — F17290 Nicotine dependence, other tobacco product, uncomplicated: Secondary | ICD-10-CM | POA: Diagnosis present

## 2016-05-31 DIAGNOSIS — R310 Gross hematuria: Secondary | ICD-10-CM | POA: Diagnosis not present

## 2016-05-31 DIAGNOSIS — I340 Nonrheumatic mitral (valve) insufficiency: Secondary | ICD-10-CM | POA: Diagnosis present

## 2016-05-31 DIAGNOSIS — I959 Hypotension, unspecified: Secondary | ICD-10-CM | POA: Diagnosis present

## 2016-05-31 DIAGNOSIS — K5900 Constipation, unspecified: Secondary | ICD-10-CM | POA: Diagnosis present

## 2016-05-31 DIAGNOSIS — I130 Hypertensive heart and chronic kidney disease with heart failure and stage 1 through stage 4 chronic kidney disease, or unspecified chronic kidney disease: Secondary | ICD-10-CM | POA: Diagnosis not present

## 2016-05-31 DIAGNOSIS — I5023 Acute on chronic systolic (congestive) heart failure: Secondary | ICD-10-CM | POA: Diagnosis not present

## 2016-05-31 DIAGNOSIS — R338 Other retention of urine: Secondary | ICD-10-CM | POA: Diagnosis present

## 2016-05-31 DIAGNOSIS — E039 Hypothyroidism, unspecified: Secondary | ICD-10-CM | POA: Diagnosis present

## 2016-05-31 DIAGNOSIS — L03115 Cellulitis of right lower limb: Secondary | ICD-10-CM | POA: Diagnosis present

## 2016-05-31 DIAGNOSIS — Z66 Do not resuscitate: Secondary | ICD-10-CM | POA: Diagnosis present

## 2016-05-31 DIAGNOSIS — Z8673 Personal history of transient ischemic attack (TIA), and cerebral infarction without residual deficits: Secondary | ICD-10-CM

## 2016-05-31 DIAGNOSIS — R601 Generalized edema: Secondary | ICD-10-CM

## 2016-05-31 DIAGNOSIS — Z9111 Patient's noncompliance with dietary regimen: Secondary | ICD-10-CM

## 2016-05-31 DIAGNOSIS — L03116 Cellulitis of left lower limb: Secondary | ICD-10-CM | POA: Diagnosis present

## 2016-05-31 DIAGNOSIS — E440 Moderate protein-calorie malnutrition: Secondary | ICD-10-CM | POA: Diagnosis present

## 2016-05-31 DIAGNOSIS — I351 Nonrheumatic aortic (valve) insufficiency: Secondary | ICD-10-CM | POA: Diagnosis present

## 2016-05-31 DIAGNOSIS — Z79899 Other long term (current) drug therapy: Secondary | ICD-10-CM

## 2016-05-31 DIAGNOSIS — I272 Pulmonary hypertension, unspecified: Secondary | ICD-10-CM | POA: Diagnosis present

## 2016-05-31 DIAGNOSIS — N189 Chronic kidney disease, unspecified: Secondary | ICD-10-CM

## 2016-05-31 DIAGNOSIS — R040 Epistaxis: Secondary | ICD-10-CM | POA: Diagnosis not present

## 2016-05-31 DIAGNOSIS — I248 Other forms of acute ischemic heart disease: Secondary | ICD-10-CM | POA: Diagnosis present

## 2016-05-31 DIAGNOSIS — J189 Pneumonia, unspecified organism: Secondary | ICD-10-CM

## 2016-05-31 DIAGNOSIS — I10 Essential (primary) hypertension: Secondary | ICD-10-CM | POA: Diagnosis present

## 2016-05-31 DIAGNOSIS — N179 Acute kidney failure, unspecified: Secondary | ICD-10-CM | POA: Diagnosis present

## 2016-05-31 DIAGNOSIS — L0390 Cellulitis, unspecified: Secondary | ICD-10-CM | POA: Diagnosis present

## 2016-05-31 DIAGNOSIS — I5043 Acute on chronic combined systolic (congestive) and diastolic (congestive) heart failure: Secondary | ICD-10-CM | POA: Diagnosis present

## 2016-05-31 DIAGNOSIS — Z833 Family history of diabetes mellitus: Secondary | ICD-10-CM

## 2016-05-31 DIAGNOSIS — F419 Anxiety disorder, unspecified: Secondary | ICD-10-CM | POA: Diagnosis present

## 2016-05-31 DIAGNOSIS — I350 Nonrheumatic aortic (valve) stenosis: Secondary | ICD-10-CM | POA: Diagnosis present

## 2016-05-31 DIAGNOSIS — K219 Gastro-esophageal reflux disease without esophagitis: Secondary | ICD-10-CM | POA: Diagnosis present

## 2016-05-31 DIAGNOSIS — Z923 Personal history of irradiation: Secondary | ICD-10-CM

## 2016-05-31 DIAGNOSIS — Z91119 Patient's noncompliance with dietary regimen due to unspecified reason: Secondary | ICD-10-CM

## 2016-05-31 DIAGNOSIS — I878 Other specified disorders of veins: Secondary | ICD-10-CM | POA: Diagnosis present

## 2016-05-31 DIAGNOSIS — Z8546 Personal history of malignant neoplasm of prostate: Secondary | ICD-10-CM

## 2016-05-31 DIAGNOSIS — N183 Chronic kidney disease, stage 3 (moderate): Secondary | ICD-10-CM | POA: Diagnosis present

## 2016-05-31 DIAGNOSIS — Z6825 Body mass index (BMI) 25.0-25.9, adult: Secondary | ICD-10-CM

## 2016-05-31 HISTORY — DX: Nonrheumatic mitral (valve) insufficiency: I34.0

## 2016-05-31 HISTORY — DX: Nonrheumatic aortic (valve) insufficiency: I35.1

## 2016-05-31 HISTORY — DX: Chronic diastolic (congestive) heart failure: I50.32

## 2016-05-31 HISTORY — DX: Nonrheumatic aortic (valve) stenosis: I35.0

## 2016-05-31 LAB — BASIC METABOLIC PANEL
BUN/Creatinine Ratio: 23 (ref 10–24)
BUN: 40 mg/dL — AB (ref 8–27)
CALCIUM: 8.9 mg/dL (ref 8.6–10.2)
CO2: 25 mmol/L (ref 18–29)
CREATININE: 1.77 mg/dL — AB (ref 0.76–1.27)
Chloride: 98 mmol/L (ref 96–106)
GFR calc Af Amer: 42 mL/min/{1.73_m2} — ABNORMAL LOW (ref >59)
GFR, EST NON AFRICAN AMERICAN: 36 mL/min/{1.73_m2} — AB (ref >59)
Glucose: 102 mg/dL — ABNORMAL HIGH (ref 65–99)
Potassium: 4.3 mmol/L (ref 3.5–5.2)
Sodium: 139 mmol/L (ref 134–144)

## 2016-05-31 MED ORDER — MORPHINE SULFATE (PF) 2 MG/ML IV SOLN
2.0000 mg | Freq: Once | INTRAVENOUS | Status: AC
  Administered 2016-06-01: 2 mg via INTRAVENOUS

## 2016-05-31 MED ORDER — ONDANSETRON HCL 4 MG/2ML IJ SOLN
4.0000 mg | Freq: Once | INTRAMUSCULAR | Status: AC
  Administered 2016-06-01: 4 mg via INTRAVENOUS

## 2016-05-31 MED ORDER — VANCOMYCIN HCL IN DEXTROSE 1-5 GM/200ML-% IV SOLN
1000.0000 mg | Freq: Once | INTRAVENOUS | Status: AC
  Administered 2016-06-01: 1000 mg via INTRAVENOUS
  Filled 2016-05-31: qty 200

## 2016-05-31 MED ORDER — FUROSEMIDE 10 MG/ML IJ SOLN
40.0000 mg | Freq: Once | INTRAMUSCULAR | Status: AC
  Administered 2016-05-31: 40 mg via INTRAVENOUS
  Filled 2016-05-31: qty 4

## 2016-05-31 NOTE — Telephone Encounter (Signed)
Left voicemail message to call back to see how patient is doing today.

## 2016-05-31 NOTE — Telephone Encounter (Signed)
-----   Message from Wende Bushy, MD sent at 05/31/2016 12:50 PM EST ----- We shd repeat in 1 week, since we did increase torsemide yesterday. Also we shd go ahead and refer to nephrology as well. Thanks.

## 2016-05-31 NOTE — Telephone Encounter (Signed)
Spoke with patients wife and she reports that he was 165 pounds this morning and that she discovered that he had not been able to urinate. She assisted the patient and then he was able to go and he is now resting. She states that she will try to monitor him more closely today and that they will see [place] in the morning to see Dr. Yvone Neu. She had no further questions and let her know to call back if needed. She was appreciative for the call following up on Nathan Kramer.

## 2016-05-31 NOTE — Telephone Encounter (Signed)
Spoke with patients wife per release form and reviewed results and recommendations. Entered orders for labs in 1 week and scheduled appointment for patient to see Dr. Juleen China on 07/20/16 at 11:40AM. Patient is coming in tomorrow for follow up and will provide them with appointment information and schedule labs as well. Patients wife verbalized understanding with no further questions at this time.

## 2016-05-31 NOTE — ED Triage Notes (Signed)
Pt taken to room 18 for triage via wheelchair.  Pt is sob with swelling of legs and abdomen.  Hx chf   Pt alert

## 2016-06-01 ENCOUNTER — Encounter: Payer: Self-pay | Admitting: Internal Medicine

## 2016-06-01 ENCOUNTER — Ambulatory Visit: Payer: Medicare Other | Admitting: Cardiology

## 2016-06-01 ENCOUNTER — Inpatient Hospital Stay (HOSPITAL_COMMUNITY)
Admit: 2016-06-01 | Discharge: 2016-06-01 | Disposition: A | Discharge status: Home / Self Care | Payer: Medicare Other | Attending: Internal Medicine | Admitting: Internal Medicine

## 2016-06-01 DIAGNOSIS — L03115 Cellulitis of right lower limb: Secondary | ICD-10-CM | POA: Diagnosis present

## 2016-06-01 DIAGNOSIS — I5023 Acute on chronic systolic (congestive) heart failure: Secondary | ICD-10-CM | POA: Diagnosis present

## 2016-06-01 DIAGNOSIS — R338 Other retention of urine: Secondary | ICD-10-CM | POA: Diagnosis not present

## 2016-06-01 DIAGNOSIS — I959 Hypotension, unspecified: Secondary | ICD-10-CM | POA: Diagnosis present

## 2016-06-01 DIAGNOSIS — Z9111 Patient's noncompliance with dietary regimen: Secondary | ICD-10-CM

## 2016-06-01 DIAGNOSIS — Z8673 Personal history of transient ischemic attack (TIA), and cerebral infarction without residual deficits: Secondary | ICD-10-CM | POA: Diagnosis not present

## 2016-06-01 DIAGNOSIS — F17290 Nicotine dependence, other tobacco product, uncomplicated: Secondary | ICD-10-CM | POA: Diagnosis present

## 2016-06-01 DIAGNOSIS — R310 Gross hematuria: Secondary | ICD-10-CM | POA: Diagnosis not present

## 2016-06-01 DIAGNOSIS — I878 Other specified disorders of veins: Secondary | ICD-10-CM | POA: Diagnosis present

## 2016-06-01 DIAGNOSIS — E440 Moderate protein-calorie malnutrition: Secondary | ICD-10-CM | POA: Diagnosis present

## 2016-06-01 DIAGNOSIS — I10 Essential (primary) hypertension: Secondary | ICD-10-CM | POA: Diagnosis not present

## 2016-06-01 DIAGNOSIS — N179 Acute kidney failure, unspecified: Secondary | ICD-10-CM | POA: Diagnosis not present

## 2016-06-01 DIAGNOSIS — I340 Nonrheumatic mitral (valve) insufficiency: Secondary | ICD-10-CM | POA: Diagnosis present

## 2016-06-01 DIAGNOSIS — N183 Chronic kidney disease, stage 3 (moderate): Secondary | ICD-10-CM | POA: Diagnosis present

## 2016-06-01 DIAGNOSIS — N189 Chronic kidney disease, unspecified: Secondary | ICD-10-CM | POA: Diagnosis not present

## 2016-06-01 DIAGNOSIS — M6250 Muscle wasting and atrophy, not elsewhere classified, unspecified site: Secondary | ICD-10-CM | POA: Diagnosis present

## 2016-06-01 DIAGNOSIS — L0390 Cellulitis, unspecified: Secondary | ICD-10-CM | POA: Diagnosis present

## 2016-06-01 DIAGNOSIS — F419 Anxiety disorder, unspecified: Secondary | ICD-10-CM | POA: Diagnosis present

## 2016-06-01 DIAGNOSIS — I272 Pulmonary hypertension, unspecified: Secondary | ICD-10-CM | POA: Diagnosis present

## 2016-06-01 DIAGNOSIS — I130 Hypertensive heart and chronic kidney disease with heart failure and stage 1 through stage 4 chronic kidney disease, or unspecified chronic kidney disease: Secondary | ICD-10-CM | POA: Diagnosis present

## 2016-06-01 DIAGNOSIS — I350 Nonrheumatic aortic (valve) stenosis: Secondary | ICD-10-CM | POA: Diagnosis not present

## 2016-06-01 DIAGNOSIS — I5031 Acute diastolic (congestive) heart failure: Secondary | ICD-10-CM

## 2016-06-01 DIAGNOSIS — E039 Hypothyroidism, unspecified: Secondary | ICD-10-CM | POA: Diagnosis present

## 2016-06-01 DIAGNOSIS — L03116 Cellulitis of left lower limb: Secondary | ICD-10-CM | POA: Diagnosis present

## 2016-06-01 DIAGNOSIS — I5033 Acute on chronic diastolic (congestive) heart failure: Secondary | ICD-10-CM

## 2016-06-01 DIAGNOSIS — I5043 Acute on chronic combined systolic (congestive) and diastolic (congestive) heart failure: Secondary | ICD-10-CM | POA: Diagnosis present

## 2016-06-01 DIAGNOSIS — R64 Cachexia: Secondary | ICD-10-CM | POA: Diagnosis present

## 2016-06-01 DIAGNOSIS — R040 Epistaxis: Secondary | ICD-10-CM | POA: Diagnosis not present

## 2016-06-01 DIAGNOSIS — Z91119 Patient's noncompliance with dietary regimen due to unspecified reason: Secondary | ICD-10-CM

## 2016-06-01 DIAGNOSIS — I248 Other forms of acute ischemic heart disease: Secondary | ICD-10-CM | POA: Diagnosis present

## 2016-06-01 DIAGNOSIS — R601 Generalized edema: Secondary | ICD-10-CM | POA: Diagnosis not present

## 2016-06-01 DIAGNOSIS — I351 Nonrheumatic aortic (valve) insufficiency: Secondary | ICD-10-CM | POA: Diagnosis present

## 2016-06-01 DIAGNOSIS — I509 Heart failure, unspecified: Secondary | ICD-10-CM

## 2016-06-01 DIAGNOSIS — Z6825 Body mass index (BMI) 25.0-25.9, adult: Secondary | ICD-10-CM | POA: Diagnosis not present

## 2016-06-01 HISTORY — DX: Heart failure, unspecified: I50.9

## 2016-06-01 LAB — BASIC METABOLIC PANEL
ANION GAP: 9 (ref 5–15)
Anion gap: 8 (ref 5–15)
BUN: 48 mg/dL — ABNORMAL HIGH (ref 6–20)
BUN: 47 mg/dL — ABNORMAL HIGH (ref 6–20)
CHLORIDE: 100 mmol/L — AB (ref 101–111)
CHLORIDE: 103 mmol/L (ref 101–111)
CO2: 29 mmol/L (ref 22–32)
CO2: 32 mmol/L (ref 22–32)
Calcium: 8.6 mg/dL — ABNORMAL LOW (ref 8.9–10.3)
Calcium: 8.8 mg/dL — ABNORMAL LOW (ref 8.9–10.3)
Creatinine, Ser: 2.31 mg/dL — ABNORMAL HIGH (ref 0.61–1.24)
Creatinine, Ser: 2.3 mg/dL — ABNORMAL HIGH (ref 0.61–1.24)
GFR calc non Af Amer: 25 mL/min — ABNORMAL LOW (ref >60)
GFR calc non Af Amer: 26 mL/min — ABNORMAL LOW (ref >60)
GFR, EST AFRICAN AMERICAN: 29 mL/min — AB (ref >60)
GFR, EST AFRICAN AMERICAN: 30 mL/min — AB (ref >60)
Glucose, Bld: 113 mg/dL — ABNORMAL HIGH (ref 65–99)
Glucose, Bld: 127 mg/dL — ABNORMAL HIGH (ref 65–99)
POTASSIUM: 3.6 mmol/L (ref 3.5–5.1)
POTASSIUM: 4.3 mmol/L (ref 3.5–5.1)
SODIUM: 140 mmol/L (ref 135–145)
SODIUM: 141 mmol/L (ref 135–145)

## 2016-06-01 LAB — ECHOCARDIOGRAM COMPLETE
Height: 68 in
Weight: 2667.2 oz

## 2016-06-01 LAB — CBC
HEMATOCRIT: 45.7 % (ref 40.0–52.0)
HEMATOCRIT: 40.6 % (ref 40.0–52.0)
HEMOGLOBIN: 13 g/dL (ref 13.0–18.0)
Hemoglobin: 14.1 g/dL (ref 13.0–18.0)
MCH: 19.7 pg — AB (ref 26.0–34.0)
MCH: 20.4 pg — ABNORMAL LOW (ref 26.0–34.0)
MCHC: 31 g/dL — ABNORMAL LOW (ref 32.0–36.0)
MCHC: 32 g/dL (ref 32.0–36.0)
MCV: 63.7 fL — AB (ref 80.0–100.0)
MCV: 63.6 fL — AB (ref 80.0–100.0)
PLATELETS: 258 10*3/uL (ref 150–440)
Platelets: 281 10*3/uL (ref 150–440)
RBC: 7.17 MIL/uL — AB (ref 4.40–5.90)
RBC: 6.39 MIL/uL — ABNORMAL HIGH (ref 4.40–5.90)
RDW: 21.6 % — ABNORMAL HIGH (ref 11.5–14.5)
RDW: 21.5 % — ABNORMAL HIGH (ref 11.5–14.5)
WBC: 28 10*3/uL — ABNORMAL HIGH (ref 3.8–10.6)
WBC: 25.9 10*3/uL — AB (ref 3.8–10.6)

## 2016-06-01 LAB — TROPONIN I
TROPONIN I: 0.18 ng/mL — AB (ref <0.03)
Troponin I: 0.17 ng/mL (ref <0.03)
Troponin I: 0.16 ng/mL (ref <0.03)
Troponin I: 0.14 ng/mL (ref <0.03)

## 2016-06-01 LAB — BRAIN NATRIURETIC PEPTIDE: B NATRIURETIC PEPTIDE 5: 1557 pg/mL — AB (ref 0.0–100.0)

## 2016-06-01 MED ORDER — SODIUM CHLORIDE 0.9 % IV SOLN: 250.0000 mL | INTRAVENOUS | Status: DC | PRN

## 2016-06-01 MED ORDER — SENNOSIDES-DOCUSATE SODIUM 8.6-50 MG PO TABS
1.0000 | ORAL_TABLET | Freq: Every evening | ORAL | Status: DC | PRN
  Administered 2016-06-01 – 2016-06-04 (×2): 1 via ORAL
  Filled 2016-06-01 (×2): qty 1

## 2016-06-01 MED ORDER — SODIUM CHLORIDE 0.9% FLUSH
3.0000 mL | Freq: Two times a day (BID) | INTRAVENOUS | Status: DC
  Administered 2016-06-01 – 2016-06-15 (×23): 3 mL via INTRAVENOUS

## 2016-06-01 MED ORDER — ONDANSETRON HCL 4 MG/2ML IJ SOLN
INTRAMUSCULAR | Status: AC
  Administered 2016-06-01: 4 mg via INTRAVENOUS
  Filled 2016-06-01: qty 2

## 2016-06-01 MED ORDER — HEPARIN SODIUM (PORCINE) 5000 UNIT/ML IJ SOLN
5000.0000 [IU] | Freq: Three times a day (TID) | INTRAMUSCULAR | Status: DC
  Administered 2016-06-01 – 2016-06-11 (×25): 5000 [IU] via SUBCUTANEOUS
  Filled 2016-06-01 (×29): qty 1

## 2016-06-01 MED ORDER — MORPHINE SULFATE ER 30 MG PO TBCR: 30.0000 mg | EXTENDED_RELEASE_TABLET | Freq: Three times a day (TID) | ORAL | Status: DC

## 2016-06-01 MED ORDER — QUETIAPINE FUMARATE 25 MG PO TABS
12.5000 mg | ORAL_TABLET | Freq: Every day | ORAL | Status: DC
Start: 2016-06-01 — End: 2016-06-01

## 2016-06-01 MED ORDER — VANCOMYCIN HCL IN DEXTROSE 750-5 MG/150ML-% IV SOLN
750.0000 mg | INTRAVENOUS | Status: DC
  Administered 2016-06-01 – 2016-06-02 (×2): 750 mg via INTRAVENOUS
  Filled 2016-06-01 (×4): qty 150

## 2016-06-01 MED ORDER — POTASSIUM CHLORIDE CRYS ER 10 MEQ PO TBCR
10.0000 meq | EXTENDED_RELEASE_TABLET | Freq: Two times a day (BID) | ORAL | Status: DC
  Administered 2016-06-01 – 2016-06-12 (×22): 10 meq via ORAL
  Filled 2016-06-01 (×22): qty 1

## 2016-06-01 MED ORDER — SODIUM CHLORIDE 0.9% FLUSH
3.0000 mL | INTRAVENOUS | Status: DC | PRN
  Administered 2016-06-09: 3 mL via INTRAVENOUS
  Filled 2016-06-01: qty 3

## 2016-06-01 MED ORDER — SODIUM CHLORIDE 0.9% FLUSH
3.0000 mL | Freq: Two times a day (BID) | INTRAVENOUS | Status: DC
  Administered 2016-06-01 – 2016-06-07 (×11): 3 mL via INTRAVENOUS

## 2016-06-01 MED ORDER — MORPHINE SULFATE (PF) 4 MG/ML IV SOLN
4.0000 mg | Freq: Once | INTRAVENOUS | Status: AC
  Administered 2016-06-01: 4 mg via INTRAVENOUS

## 2016-06-01 MED ORDER — METOLAZONE 2.5 MG PO TABS
2.5000 mg | ORAL_TABLET | ORAL | Status: DC
  Filled 2016-06-01: qty 1

## 2016-06-01 MED ORDER — FUROSEMIDE 10 MG/ML IJ SOLN
40.0000 mg | Freq: Once | INTRAMUSCULAR | Status: AC
  Administered 2016-06-01: 40 mg via INTRAVENOUS
  Filled 2016-06-01: qty 4

## 2016-06-01 MED ORDER — MORPHINE SULFATE ER 30 MG PO TBCR
30.0000 mg | EXTENDED_RELEASE_TABLET | Freq: Three times a day (TID) | ORAL | Status: DC
  Administered 2016-06-01 – 2016-06-15 (×44): 30 mg via ORAL
  Filled 2016-06-01 (×44): qty 1

## 2016-06-01 MED ORDER — QUETIAPINE FUMARATE 25 MG PO TABS: 37.5000 mg | ORAL_TABLET | Freq: Every day | ORAL | Status: DC

## 2016-06-01 MED ORDER — ENSURE ENLIVE PO LIQD
237.0000 mL | Freq: Three times a day (TID) | ORAL | Status: DC
  Administered 2016-06-01 – 2016-06-15 (×33): 237 mL via ORAL

## 2016-06-01 MED ORDER — ONDANSETRON HCL 4 MG PO TABS: 4.0000 mg | ORAL_TABLET | Freq: Four times a day (QID) | ORAL | Status: DC | PRN

## 2016-06-01 MED ORDER — FUROSEMIDE 10 MG/ML IJ SOLN: 60.0000 mg | Freq: Two times a day (BID) | INTRAMUSCULAR | Status: DC

## 2016-06-01 MED ORDER — MORPHINE SULFATE (PF) 4 MG/ML IV SOLN
INTRAVENOUS | Status: AC
  Administered 2016-06-01: 4 mg via INTRAVENOUS
  Filled 2016-06-01: qty 1

## 2016-06-01 MED ORDER — ONDANSETRON HCL 4 MG/2ML IJ SOLN: 4.0000 mg | Freq: Four times a day (QID) | INTRAMUSCULAR | Status: DC | PRN

## 2016-06-01 MED ORDER — FUROSEMIDE 10 MG/ML IJ SOLN
40.0000 mg | Freq: Two times a day (BID) | INTRAMUSCULAR | Status: DC
  Administered 2016-06-01 (×2): 40 mg via INTRAVENOUS
  Filled 2016-06-01 (×2): qty 4

## 2016-06-01 MED ORDER — ACETAMINOPHEN 650 MG RE SUPP: 650.0000 mg | Freq: Four times a day (QID) | RECTAL | Status: DC | PRN

## 2016-06-01 MED ORDER — LEVOTHYROXINE SODIUM 25 MCG PO TABS
25.0000 ug | ORAL_TABLET | Freq: Every day | ORAL | Status: DC
  Administered 2016-06-01 – 2016-06-15 (×15): 25 ug via ORAL
  Filled 2016-06-01 (×16): qty 1

## 2016-06-01 MED ORDER — HYDROCODONE-ACETAMINOPHEN 5-325 MG PO TABS
1.0000 | ORAL_TABLET | ORAL | Status: DC | PRN
  Administered 2016-06-02: 1 via ORAL
  Administered 2016-06-03 – 2016-06-09 (×25): 2 via ORAL
  Administered 2016-06-09 (×2): 1 via ORAL
  Administered 2016-06-10 (×3): 2 via ORAL
  Administered 2016-06-10: 1 via ORAL
  Administered 2016-06-10 – 2016-06-13 (×12): 2 via ORAL
  Administered 2016-06-14: 1 via ORAL
  Administered 2016-06-14 – 2016-06-15 (×5): 2 via ORAL
  Filled 2016-06-01 (×15): qty 2
  Filled 2016-06-01: qty 1
  Filled 2016-06-01 (×9): qty 2
  Filled 2016-06-01: qty 1
  Filled 2016-06-01 (×17): qty 2
  Filled 2016-06-01 (×2): qty 1
  Filled 2016-06-01 (×2): qty 2
  Filled 2016-06-01: qty 1
  Filled 2016-06-01 (×3): qty 2

## 2016-06-01 MED ORDER — FERROUS GLUCONATE 324 (38 FE) MG PO TABS
324.0000 mg | ORAL_TABLET | Freq: Two times a day (BID) | ORAL | Status: DC
  Administered 2016-06-01 – 2016-06-15 (×29): 324 mg via ORAL
  Filled 2016-06-01 (×32): qty 1

## 2016-06-01 MED ORDER — ASPIRIN EC 81 MG PO TBEC
81.0000 mg | DELAYED_RELEASE_TABLET | Freq: Every day | ORAL | Status: DC
  Administered 2016-06-01 – 2016-06-10 (×10): 81 mg via ORAL
  Filled 2016-06-01 (×9): qty 1

## 2016-06-01 MED ORDER — MORPHINE SULFATE (PF) 2 MG/ML IV SOLN
INTRAVENOUS | Status: AC
  Administered 2016-06-01: 2 mg via INTRAVENOUS
  Filled 2016-06-01: qty 1

## 2016-06-01 MED ORDER — VANCOMYCIN HCL IN DEXTROSE 1-5 GM/200ML-% IV SOLN: 1000.0000 mg | Freq: Once | INTRAVENOUS | Status: DC

## 2016-06-01 MED ORDER — ACETAMINOPHEN 325 MG PO TABS
650.0000 mg | ORAL_TABLET | Freq: Four times a day (QID) | ORAL | Status: DC | PRN
  Administered 2016-06-04 – 2016-06-09 (×3): 650 mg via ORAL
  Filled 2016-06-01 (×3): qty 2

## 2016-06-01 MED ORDER — FUROSEMIDE 10 MG/ML IJ SOLN: 20.0000 mg | Freq: Two times a day (BID) | INTRAMUSCULAR | Status: DC

## 2016-06-01 NOTE — ED Notes (Signed)
Report to RN: Andria Meuse, on 2A. Encouraged to call with questions.

## 2016-06-01 NOTE — H&P (Signed)
[facility] [place] NAME: Nathan Kramer    MR#:  [id number]  DATE OF BIRTH:  [birth year]  DATE OF ADMISSION:  05/31/2016  PRIMARY CARE PHYSICIAN: Tracie Harrier, MD   REQUESTING/REFERRING PHYSICIAN:   CHIEF COMPLAINT:   Chief Complaint  Patient presents with  . Shortness of Breath  . Leg Swelling    HISTORY OF PRESENT ILLNESS: Nathan Kramer  is a 81 y.o. male with a known history of Congestive heart failure, chronic kidney disease, anemia, prostate cancer, hypertension, CVA presented to the emergency room with swelling in both the lower legs along with redness and weeping lesions in the lower extremities. Patient has serous sanguinous fluid weeping from the lower extremity skin. Her patient also complains of shortness of breath for the last couple of weeks. Has history of orthopnea. No complaints of any chest pain. He was evaluated in the emergency room his BNP was elevated. He was given IV Lasix for diuresis and IV vancomycin and divided was started for lower extremity cellulitis. Troponin was elevated but patient also had elevation in creatinine. Elevated troponin could be secondary to demand ischemia and renal insufficiency. EKG normal sinus rhythm with no ST segment elevation. Patient was put on oxygen via nasal cannula in the emergency room. Hospitalist service was consulted for further care of the patient.  PAST MEDICAL HISTORY:   Past Medical History:  Diagnosis Date  . Anemia   . CHF (congestive heart failure) ([place]) 01/2016  . Other chronic pain   . Prostate cancer ([place])   . Reflux esophagitis   . Stroke ([place])   . Undiagnosed cardiac murmurs   . Unspecified essential hypertension   . Unspecified visual loss     PAST SURGICAL HISTORY: Past Surgical History:  Procedure Laterality Date  . back surgery    . COLONOSCOPY      SOCIAL HISTORY:  Social History  Substance Use Topics  . Smoking status: Current Some Day  Smoker    Packs/day: 1.50    Years: 50.00    Types: Cigars  . Smokeless tobacco: Never Used  . Alcohol use No    FAMILY HISTORY:  Family History  Problem Relation Age of Onset  . Diabetes Mother   . Hypertension Mother   . Hypertension Father   . Heart disease Father   . Cancer Sister   . Heart disease Brother     DRUG ALLERGIES:  Allergies  Allergen Reactions  . Amoxicillin     Other reaction(s): Localized superficial swelling of skin Face swelling   . Reglan [Metoclopramide]     Involuntary movements body    REVIEW OF SYSTEMS:   CONSTITUTIONAL: No fever, has weakness.  EYES: No blurred or double vision.  EARS, NOSE, AND THROAT: No tinnitus or ear pain.  RESPIRATORY: No cough, has shortness of breath,  No wheezing or hemoptysis.  CARDIOVASCULAR: No chest pain,  Has orthopnea, edema.  GASTROINTESTINAL: No nausea, vomiting, diarrhea or abdominal pain.  GENITOURINARY: No dysuria, hematuria.  ENDOCRINE: No polyuria, nocturia,  HEMATOLOGY: No anemia, easy bruising or bleeding SKIN : redness of skin lower extremities MUSCULOSKELETAL: No joint pain or arthritis.   NEUROLOGIC: No tingling, numbness, weakness.  PSYCHIATRY: No anxiety or depression.   MEDICATIONS AT HOME:  Prior to Admission medications   Medication Sig Start Date End Date Taking? Authorizing Provider  diphenhydrAMINE (BENADRYL) 25 MG tablet Take 25 mg by mouth as needed.    Yes Historical Provider, MD  esomeprazole (  NEXIUM) 10 MG packet Take 20 mg by mouth daily before breakfast.   Yes Historical Provider, MD  Ferrous Gluconate (IRON 27 PO) Take 1 tablet by mouth daily.   Yes Historical Provider, MD  KLOR-CON M20 20 MEQ tablet TAKE 1 TABLET (20 MEQ TOTAL) BY MOUTH DAILY. 04/14/16  Yes Wende Bushy, MD  levothyroxine (SYNTHROID, LEVOTHROID) 25 MCG tablet Take 25 mcg by mouth daily before breakfast.   Yes Historical Provider, MD  metolazone (ZAROXOLYN) 2.5 MG tablet Take 2.5 mg by mouth every 7 (seven)  days.   Yes Historical Provider, MD  morphine (MS CONTIN) 30 MG 12 hr tablet Take 1 tablet (30 mg total) by mouth 3 (three) times daily with meals. 05/09/16 06/08/16 Yes Molli Barrows, MD  Polyethylene Glycol 3350 (CLEARLAX PO) Take 1 packet by mouth daily.   Yes Historical Provider, MD  torsemide (DEMADEX) 20 MG tablet Take 2 tablets (40 mg total) by mouth daily. 05/30/16  Yes Wende Bushy, MD      PHYSICAL EXAMINATION:   VITAL SIGNS: Blood pressure (!) 105/55, pulse 78, temperature 97.8 F (36.6 C), temperature source Oral, resp. rate 20, height 5\' 8"  (1.727 m), weight 77.1 kg (170 lb), SpO2 97 %.  GENERAL:  81 y.o.-year-old patient lying in the bed with no acute distress.  EYES: Pupils equal, round, reactive to light and accommodation. No scleral icterus. Extraocular muscles intact.  HEENT: Head atraumatic, normocephalic. Oropharynx and nasopharynx clear.  NECK:  Supple, no jugular venous distention. No thyroid enlargement, no tenderness.  LUNGS: Decreased breath sounds bilaterally, bibasilar crepitations heard. No use of accessory muscles of respiration.  CARDIOVASCULAR: S1, S2 normal. No murmurs, rubs, or gallops.  ABDOMEN: Soft, nontender, nondistended. Bowel sounds present. No organomegaly or mass.  EXTREMITIES: bilateral lower extremity 2 + edema Redness of skin lower extremities Weeping lesions noted over lower extremities. NEUROLOGIC: Cranial nerves II through XII are intact. Muscle strength 5/5 in all extremities. Sensation intact. Gait not checked.  PSYCHIATRIC: The patient is alert and oriented x 3.  SKIN: redness of skin lower extremities  LABORATORY PANEL:   CBC  Recent Labs Lab 05/31/16 2251  WBC 28.0*  HGB 14.1  HCT 45.7  PLT 281  MCV 63.7*  MCH 19.7*  MCHC 31.0*  RDW 21.6*   ------------------------------------------------------------------------------------------------------------------  Chemistries   Recent Labs Lab 05/30/16 1139 06/01/16 0020  NA 139  141  K 4.3 4.3  CL 98 103  CO2 25 29  GLUCOSE 102* 113*  BUN 40* 48*  CREATININE 1.77* 2.31*  CALCIUM 8.9 8.6*   ------------------------------------------------------------------------------------------------------------------ estimated creatinine clearance is 25.5 mL/min (by C-G formula based on SCr of 2.31 mg/dL (H)). ------------------------------------------------------------------------------------------------------------------ No results for input(s): TSH, T4TOTAL, T3FREE, THYROIDAB in the last 72 hours.  Invalid input(s): FREET3   Coagulation profile No results for input(s): INR, PROTIME in the last 168 hours. ------------------------------------------------------------------------------------------------------------------- No results for input(s): DDIMER in the last 72 hours. -------------------------------------------------------------------------------------------------------------------  Cardiac Enzymes  Recent Labs Lab 06/01/16 0020  TROPONINI 0.18*   ------------------------------------------------------------------------------------------------------------------ Invalid input(s): POCBNP  ---------------------------------------------------------------------------------------------------------------  Urinalysis No results found for: COLORURINE, APPEARANCEUR, LABSPEC, PHURINE, GLUCOSEU, HGBUR, BILIRUBINUR, KETONESUR, PROTEINUR, UROBILINOGEN, NITRITE, LEUKOCYTESUR   RADIOLOGY: Dg Chest 2 View  Result Date: 05/31/2016 CLINICAL DATA:  Shortness of breath, abdominal swelling. History of prostate cancer, hypertension. EXAM: CHEST  2 VIEW COMPARISON:  Chest radiograph January 12, 2016 FINDINGS: The cardiac silhouette is mildly enlarged unchanged. Mediastinal silhouette is nonsuspicious, calcified aortic knob. Persistently elevated RIGHT hemidiaphragm with RIGHT lung base scarring  and bibasilar pleural thickening. Trachea projects midline and there is no pneumothorax.  Soft tissue planes and included osseous structures are nonsuspicious. IMPRESSION: Stable examination: RIGHT lung base scarring with pleural thickening, no acute cardiopulmonary process. Electronically Signed   By: Elon Alas M.D.   On: 05/31/2016 23:09    EKG: Orders placed or performed during the hospital encounter of 05/31/16  . ED EKG within 10 minutes  . ED EKG within 10 minutes    IMPRESSION AND PLAN: 81 year old male patient with history of congestive heart failure, chronic kidney disease, chronic lower extremity edema, hypertension, prostate cancer, CVA presented to the emergency room with increased shortness of breath and increased swelling in the lower extremity along with the redness of the skin of the lower extremity with weeping lesions. Admitting diagnosis 1. Decompensated heart failure 2. Bilateral lower extremity cellulitis 3. Acute on chronic kidney injury 4. Elevated troponin which could be secondary to demand ischemia 5. Hypertension 6. Dyspnea secondary to CHF exacerbation Treatment plan Admit patient to telemetry inpatient service Diurese patient with IV Lasix 60 MG every 12 hourly Monitor renal function Foley catheter Input-output chart Monitor electrolytes Start patient on IV vancomycin antibiotic for cellulitis of lower extremity Cycle troponin Cardiology consultation Check echocardiogram Low-salt diet Oxygen via nasal cannula 2 L Supportive care.  All the records are reviewed and case discussed with ED provider. Management plans discussed with the patient, family and they are in agreement.  CODE STATUS:DNR Surrogate decision maker : spouse maryann.    Code Status Orders        Start     Ordered   06/01/16 0252  Do not attempt resuscitation (DNR)  Continuous    Question Answer Comment  In the event of cardiac or respiratory ARREST Do not call a "code blue"   In the event of cardiac or respiratory ARREST Do not perform Intubation, CPR,  defibrillation or ACLS   In the event of cardiac or respiratory ARREST Use medication by any route, position, wound care, and other measures to relive pain and suffering. May use oxygen, suction and manual treatment of airway obstruction as needed for comfort.      06/01/16 0251    Code Status History    Date Active Date Inactive Code Status Order ID Comments User Context   01/11/2016  2:26 PM 01/16/2016  3:06 PM Full Code [id number]  Bettey Costa, MD Inpatient   01/11/2016 12:36 PM 01/11/2016  2:26 PM DNR [id number]  Bettey Costa, MD ED    Advance Directive Documentation   [place] Most Recent Value  Type of Advance Directive  Living will  Pre-existing out of facility DNR order (yellow form or pink MOST form)  No data  "MOST" Form in Place?  No data       TOTAL TIME TAKING CARE OF THIS PATIENT: 55 minutes.    Saundra Shelling M.D on 06/01/2016 at 2:52 AM  Between 7am to 6pm - Pager - [phone]  After 6pm go to [email or web address] - password [email or web address] [place] [facility]  Office  [phone]  CC: Primary care physician; Tracie Harrier, MD

## 2016-06-01 NOTE — Care Management Note (Signed)
Case Management Note  Patient Details  Name: HANZ JECK MRN: [id number] Date of Birth: [birth year]  Subjective/Objective:         Patient lives with his wife and son/daughter [place]  He is able to ambulate without assistive device.  She is currently requiring oxygen and appears short of breath with conversation.  Says that he wakes up in the middle of the night frequently short of breath.  Wonders if he need home 02 or at least oxygen to use at night.  Discussed home 02 assessment  and possible overnight oximetry when patient is not requiring 02 at rest.  Patient is in agreement  with home health nursing and no agency Heads up referral to [facility].  Referral made to heart Failure Clinic         Action/Plan:   Expected Discharge Date:                  Expected Discharge Plan:  [place]  In-House Referral:     Discharge planning Services  CM Consult  Post Acute Care Choice:    Choice offered to:     DME Arranged:    DME Agency:     HH Arranged:    HH Agency:     Status of Service:  In process, will continue to follow  If discussed at Long Length of Stay Meetings, dates discussed:    Additional Comments:  Katrina Stack, RN 06/01/2016, 3:21 PM

## 2016-06-01 NOTE — Consult Note (Signed)
Cardiology Consultation Note  Patient ID: Nathan Kramer, MRN: [id number], DOB/AGE: 1935-11-24 81 y.o. Admit date: 05/31/2016   Date of Consult: 06/01/2016 Primary Physician: Tracie Harrier, MD Primary Cardiologist: Dr. Yvone Neu, MD Requesting Physician: Dr. Estanislado Pandy, MD  Chief Complaint: SOB/LE edema Reason for Consult: Acute on chronic diastolic CHF  HPI: 81 y.o. male with h/o chronic diastolic CHF, prior stroke 09/2012, moderate to severe aortic stenosis, moderate AI, chronic LE edema, chronic back pain, and prostate cancer s/p radiation who presented to [facility] with increaed SOB, LE swelling, and weight gain of 30 pounds since he was seen by Dr. Yvone Neu in 02/2016.   He has been followed by Dr. Yvone Neu for his aortic stenosis as well as his chronic diastolic CHF/LE edema. Back in June 2017 he was noting increased SOB. He initially underwent echo on 09/07/15 that showed an EF of 55-60%, no RWMA, GR2DD, moderate to severe aortic stenosis with a valve area of 0.84, meand gradient of 25 mmHg, moderate AI, mildly dilated ascending aorta at 37 mm, mild MS, LA mildly dilated, and elevated PASP at 55 mmHg. This was followed by a Lexiscan Myoview on 7/3 that showed no significant ischemia, normal wall motion, EF 55%, no EKG changes, low risk scan overall. He was seen in the ED in late July for increase in LE swelling and concern for possible DVT. Lower extremity ultrasound was negative for DVT. In multiple follow up visits with Dr. Yvone Neu he continued to note continued SOB. He was diuresed and continued to decline invasive testing or procedures regarding his symptoms and known AS. He was admitted to [facility] in October 2017 for acute on chronic diastolic CHF. he diuresed well, with a discharge weight of 149 pounds. In follow up with Dr. Yvone Neu in 02/2016 his weight was noted to be 145 pounds and was continued on PO Lasix 60 mg bid. Patient was recently seen by PCP on 05/18/16 with volume overload. He was changed from Lasix 60  mg bid to torsemide 20 mg daily and added metolazone 2.5 mg every 7 days. He was seen by Dr. Yvone Neu on 2/27 with continued voluem overload, LE swelling, erythema, weeping, and sctroal edema. He was advised to be admitted, though he refused. He was given IV Lasix 40 mg in the office with close follow up and instructions to come to the ED if weight gain > 2 pounds. Weight at his visit with Dr. Yvone Neu on 2/27 was noted to be 175 pounds (30 pounds up from 02/2016 visit). Patient continued to note worsening of LE swelling followed by the development of penile edema preventing him from voiding. His legs pain from swelling was preventing him from ambulating. He denied missing any medications, though does drink large amount of liquid daily and applies salt to his food. Upon his arrival to [facility] his admission weight was noted to be 170 pounds. He was given IV Lasix 40 mg x 2 in the ED and Foley catheter was placed. Documented UOP of 200 cc. Patient reports the Foley bag has been changed x 1 and it is currently full at time of cardiology consult. With the above diuresis his renal function has worsened to 2.30 this morning with a baseline of approximately 1.7. Potassium 4.3-->3.6. WBC 28,000-->25,900, hgb 14.1, plt 281. Troponin 0.18-->0.17. BNP 1,557. Blood cultures negative x 2 to date at < 12 hours of growth. CXR with elevated right hemidiaphragm, with pleural thickening. BP on the soft side in the low [id number] systolic. He reports breathing much better  this morning and has oxygen saturations in teh mid 90's on room air. His LE erythema and weeping remains present, though improved. Echo is pending.    Past Medical History:  Diagnosis Date  . Anemia   . Aortic insufficiency   . Aortic stenosis   . Chronic diastolic CHF (congestive heart failure) ([place]) 01/2016  . Mitral regurgitation   . Other chronic pain   . Prostate cancer ([place])   . Reflux esophagitis   . Stroke ([place])   . Unspecified essential hypertension   .  Unspecified visual loss       Most Recent Cardiac Studies: TTE 09/2015: Study Conclusions  - Left ventricle: The cavity size was normal. There was mild   concentric hypertrophy. Systolic function was normal. The   estimated ejection fraction was in the range of 55% to 60%. Wall   motion was normal; there were no regional wall motion   abnormalities. Features are consistent with a pseudonormal left   ventricular filling pattern, with concomitant abnormal relaxation   and increased filling pressure (grade 2 diastolic dysfunction). - Aortic valve: There was moderate to severe stenosis. There was   moderate regurgitation. Mean gradient (S): 25 mm Hg. Valve area   (VTI): 0.84 cm^2. Valve area (Vmax): 0.81 cm^2. - Aorta: Ascending aortic diameter: 37 mm (S). - Mitral valve: Calcified annulus. Mildly thickened, moderately   calcified leaflets . The findings are consistent with mild   stenosis. Valve area by continuity equation (using LVOT flow):   1.84 cm^2. - Left atrium: The atrium was mildly dilated. - Pulmonary arteries: Systolic pressure was moderately increased.   PA peak pressure: 55 mm Hg (S).   Surgical History:  Past Surgical History:  Procedure Laterality Date  . back surgery    . COLONOSCOPY       Home Meds: Prior to Admission medications   Medication Sig Start Date End Date Taking? Authorizing Provider  diphenhydrAMINE (BENADRYL) 25 MG tablet Take 25 mg by mouth as needed.    Yes Historical Provider, MD  esomeprazole (NEXIUM) 10 MG packet Take 20 mg by mouth daily before breakfast.   Yes Historical Provider, MD  Ferrous Gluconate (IRON 27 PO) Take 1 tablet by mouth daily.   Yes Historical Provider, MD  KLOR-CON M20 20 MEQ tablet TAKE 1 TABLET (20 MEQ TOTAL) BY MOUTH DAILY. 04/14/16  Yes Wende Bushy, MD  levothyroxine (SYNTHROID, LEVOTHROID) 25 MCG tablet Take 25 mcg by mouth daily before breakfast.   Yes Historical Provider, MD  metolazone (ZAROXOLYN) 2.5 MG tablet Take  2.5 mg by mouth every 7 (seven) days.   Yes Historical Provider, MD  morphine (MS CONTIN) 30 MG 12 hr tablet Take 1 tablet (30 mg total) by mouth 3 (three) times daily with meals. 05/09/16 06/08/16 Yes Molli Barrows, MD  Polyethylene Glycol 3350 (CLEARLAX PO) Take 1 packet by mouth daily.   Yes Historical Provider, MD  torsemide (DEMADEX) 20 MG tablet Take 2 tablets (40 mg total) by mouth daily. 05/30/16  Yes Wende Bushy, MD    Inpatient Medications:  . aspirin EC  81 mg Oral Daily  . ferrous gluconate  324 mg Oral BID WC  . furosemide  20 mg Intravenous BID  . heparin  5,000 Units Subcutaneous Q8H  . levothyroxine  25 mcg Oral QAC breakfast  . morphine  30 mg Oral TID WC  . sodium chloride flush  3 mL Intravenous Q12H  . sodium chloride flush  3 mL Intravenous Q12H  .  vancomycin  750 mg Intravenous Q24H     Allergies:  Allergies  Allergen Reactions  . Amoxicillin     Other reaction(s): Localized superficial swelling of skin Face swelling   . Reglan [Metoclopramide]     Involuntary movements body    Social History   Social History  . Marital status: Married    Spouse name: N/A  . Number of children: N/A  . Years of education: N/A   Occupational History  . retired    Social History Main Topics  . Smoking status: Current Some Day Smoker    Packs/day: 1.50    Years: 50.00    Types: Cigars  . Smokeless tobacco: Never Used  . Alcohol use No  . Drug use: No  . Sexual activity: Not on file   Other Topics Concern  . Not on file   Social History Narrative  . No narrative on file     Family History  Problem Relation Age of Onset  . Diabetes Mother   . Hypertension Mother   . Hypertension Father   . Heart disease Father   . Cancer Sister   . Heart disease Brother      Review of Systems: Review of Systems  Constitutional: Positive for malaise/fatigue. Negative for chills, diaphoresis, fever and weight loss.  HENT: Negative for congestion.   Eyes: Negative for  discharge and redness.  Respiratory: Positive for cough and shortness of breath. Negative for hemoptysis, sputum production and wheezing.   Cardiovascular: Positive for orthopnea and leg swelling. Negative for chest pain, palpitations, claudication and PND.  Gastrointestinal: Negative for abdominal pain, blood in stool, heartburn, melena, nausea and vomiting.  Genitourinary: Negative for hematuria.  Musculoskeletal: Negative for falls and myalgias.  Skin: Negative for rash.  Neurological: Positive for weakness. Negative for dizziness, tingling, tremors, sensory change, speech change, focal weakness and loss of consciousness.  Endo/Heme/Allergies: Does not bruise/bleed easily.  Psychiatric/Behavioral: Negative for substance abuse. The patient is not nervous/anxious.   All other systems reviewed and are negative.   Labs:  Recent Labs  06/01/16 0020 06/01/16 0455  TROPONINI 0.18* 0.17*   Lab Results  Component Value Date   WBC 25.9 (H) 06/01/2016   HGB 13.0 06/01/2016   HCT 40.6 06/01/2016   MCV 63.6 (L) 06/01/2016   PLT 258 06/01/2016     Recent Labs Lab 06/01/16 0455  NA 140  K 3.6  CL 100*  CO2 32  BUN 47*  CREATININE 2.30*  CALCIUM 8.8*  GLUCOSE 127*   No results found for: CHOL, HDL, LDLCALC, TRIG No results found for: DDIMER  Radiology/Studies:  Dg Chest 2 View  Result Date: 05/31/2016 CLINICAL DATA:  Shortness of breath, abdominal swelling. History of prostate cancer, hypertension. EXAM: CHEST  2 VIEW COMPARISON:  Chest radiograph January 12, 2016 FINDINGS: The cardiac silhouette is mildly enlarged unchanged. Mediastinal silhouette is nonsuspicious, calcified aortic knob. Persistently elevated RIGHT hemidiaphragm with RIGHT lung base scarring and bibasilar pleural thickening. Trachea projects midline and there is no pneumothorax. Soft tissue planes and included osseous structures are nonsuspicious. IMPRESSION: Stable examination: RIGHT lung base scarring with  pleural thickening, no acute cardiopulmonary process. Electronically Signed   By: Elon Alas M.D.   On: 05/31/2016 23:09    EKG: Interpreted by me showed: NSR, 92 bpm, right axis deviation, TWI leads II, III, aVF, V4-V6 Telemetry: Interpreted by me showed: sinus rhythm, 80's bpm  Weights: Filed Weights   05/31/16 2240 06/01/16 0415  Weight: 170 lb (77.1  kg) 166 lb 11.2 oz (75.6 kg)     Physical Exam: Blood pressure (!) 108/48, pulse 86, temperature 98 F (36.7 C), temperature source Oral, resp. rate 20, height 5\' 8"  (1.727 m), weight 166 lb 11.2 oz (75.6 kg), SpO2 92 %. Body mass index is 25.35 kg/m. General: Well developed, well nourished, in no acute distress. Head: Normocephalic, atraumatic, sclera non-icteric, no xanthomas, nares are without discharge.  Neck: Negative for carotid bruits. JVD elevated to the jaw. Lungs: Coarse breath sounds bilatertally. No crackles. Breathing is unlabored on room air.  Heart: RRR with S1 S2. III/VI systolic murmur RUSB radiating to the neck and LLSB radiating to the apex, no rubs, or gallops appreciated. Abdomen: Soft, non-tender, distended with normoactive bowel sounds. No hepatomegaly. No rebound/guarding. No obvious abdominal masses. Msk:  Strength and tone appear normal for age. Extremities: No clubbing or cyanosis. 3+ pitting edema to the scrotum with associated erythema, warmth, blisters, and weeping along the bilateral shins. Distal pedal pulses are 2+ and equal bilaterally. Neuro: Alert and oriented X 3. No facial asymmetry. No focal deficit. Moves all extremities spontaneously. Psych:  Responds to questions appropriately with a normal affect.    Assessment and Plan:  Principal Problem:   Acute on chronic diastolic CHF (congestive heart failure), NYHA class 3 (HCC) Active Problems:   Aortic stenosis, moderate   Acute kidney injury superimposed on CKD (HCC)   Dietary noncompliance   HTN (hypertension)   Cellulitis    1.  Acute on chronic diastolic CHF/pulmonary HTN: -Likely in the setting of dietary noncompliance, drinking large amounts of fluids and adding salt to his food -Received IV Lasix 40 mg x 2 in the ED with good UOP per patient -Renal function has declined to 2.3 from a baseline of 1.7  -Possible decline from congestion vs aggressive diuresis  -BP soft this morning in the low [id number] systolic -Will need continued IV diuresis for several more days as he is currently 25 pounds over dry weight with large amount of LE edema -IV Lasix 20 mg bid with KCl repletion until renal function improves, then likely titration of IV Lasix -Hold metolazone for now given renal function -Not on beta blocker 2/2 soft BP -CHF education  2. Valvular heart disease: -Has previously not wanted to pursue intervention -Echo pending to further evaluate -Likely playing a role in some of his symptoms   3. HTN: -BP soft this morning -As above  4. LE cellulitis/leukocytosis: -Vancomycin per IM  5. Elevated troponin: -No chest pain -Likely supply demand ischemias in the setting of volume overload and CKD  6. Acute on CKD stage III: -As above -Monitor with diuresis    Signed, Christell Faith, PA-C [place] Pager: [phone] 06/01/2016, 8:34 AM

## 2016-06-01 NOTE — Progress Notes (Addendum)
Initial Nutrition Assessment  DOCUMENTATION CODES:   Severe malnutrition in context of chronic illness  INTERVENTION:  1. Ensure Enlive po BID, each supplement provides 350 kcal and 20 grams of protein  NUTRITION DIAGNOSIS:   Malnutrition related to chronic illness as evidenced by severe fluid accumulation, severe depletion of muscle mass, severe depletion of body fat.  GOAL:   Patient will meet greater than or equal to 90% of their needs  MONITOR:   PO intake, I & O's, Labs, Weight trends, Supplement acceptance  REASON FOR ASSESSMENT:   Malnutrition Screening Tool    ASSESSMENT:    Nathan Kramer  is a 81 y.o. male with a known history of Congestive heart failure, chronic kidney disease, anemia, prostate cancer, hypertension, CVA presented to the emergency room with swelling in both the lower legs along with redness and weeping lesions in the lower extremities  Spoke with Nathan Kramer at bedside. He reports great appetite. "I eat anything I see." Lacks teeth, but seems to be able to tolerate foods as long as he selects them. He was unsure of a normal weight. Legs are very edematous - states he carries fluid in his chest/abdomen and lower extremities. Nutrition-Focused physical exam completed. Findings are severe fat depletion, severe muscle depletion, and severe edema.  He reports good PO intake for breakfast - but checked health-touch afterward and patient did not have any breakfast. Large lunch ordered will monitor for PO intake. No documented meal completion thus far.  Denies any nausea/vomiting/diarrhea/constipation Labs and medications reviewed: Colace Na 133   Diet Order:  Diet Heart Room service appropriate? Yes; Fluid consistency: Thin  Skin:  Reviewed, no issues  Last BM:  05/31/2016  Height:   Ht Readings from Last 1 Encounters:  06/01/16 5\' 8"  (1.727 m)    Weight:   Wt Readings from Last 1 Encounters:  06/01/16 166 lb 11.2 oz (75.6 kg)    Ideal  Body Weight:  70 kg  BMI:  Body mass index is 25.35 kg/m.  Estimated Nutritional Needs:   Kcal:  1500-1800 calories  Protein:  75-91 gm  Fluid:  >/= 1.5L  EDUCATION NEEDS:   No education needs identified at this time  Satira Anis. Tracker Mance, MS, RD LDN Inpatient Clinical Dietitian Pager [phone]

## 2016-06-01 NOTE — Progress Notes (Signed)
*  PRELIMINARY RESULTS* Echocardiogram 2D Echocardiogram has been performed.  Sherrie Sport 06/01/2016, 9:59 AM

## 2016-06-01 NOTE — ED Provider Notes (Addendum)
[facility] Emergency Department Provider Note   ____________________________________________   First MD Initiated Contact with Patient 05/31/16 2317     (approximate)  I have reviewed the triage vital signs and the nursing notes.   HISTORY  Chief Complaint Shortness of Breath and Leg Swelling    HPI Nathan Kramer is a 81 y.o. male who comes into the hospital today with some lower extremity swelling, penis swelling and shortness of breath. The patient's family reports that he has a history of heart failure and sometimes he has a little bit of fluid so severe that he cannot treated at home. The patient has gained about 20 pounds of fluid in the last week. He did see his cardiologist Dr.Ingal yesterday who increased his medication. He reports that he was given a shot to help get rid of the fluid but his penis is so swollen that he is unable to urinate well. His cardiologist told him that if he gained any extra weight that he would have to come into the emergency department. They also planned to admit the patient if he did not lose a good amount of fluid in the next 2 days. He has been having increased dyspnea with exertion and can barely get up to walk. He also has some sores on his legs. The symptoms have been worse in the last week. He is here today for evaluation.   Past Medical History:  Diagnosis Date  . Anemia   . CHF (congestive heart failure) ([place]) 01/2016  . Other chronic pain   . Prostate cancer ([place])   . Reflux esophagitis   . Stroke ([place])   . Undiagnosed cardiac murmurs   . Unspecified essential hypertension   . Unspecified visual loss     Patient Active Problem List   Diagnosis Date Noted  . Generalized edema   . Acute on chronic diastolic CHF (congestive heart failure), NYHA class 2 ([place]) 01/11/2016  . Acute kidney injury superimposed on CKD ([place]) 01/11/2016  . Cardiorenal syndrome   . CHF exacerbation ([place])   . Dyspnea on exertion     . Bilateral leg edema   . Allergic state 07/05/2015  . Current tobacco use 07/05/2015  . Lumbar canal stenosis 07/05/2015  . Discharge from nose 02/15/2015  . Nutritional anemia 10/14/2014  . DJD (degenerative joint disease) of knee 09/16/2014  . DDD (degenerative disc disease), lumbar 08/31/2014  . DDD (degenerative disc disease), thoracic 08/31/2014  . DDD (degenerative disc disease), cervical 08/31/2014  . Lumbosacral facet joint syndrome 08/31/2014  . Spinal stenosis, lumbar region, with neurogenic claudication 08/31/2014  . Sacroiliac joint dysfunction 08/31/2014  . HTN (hypertension) 08/20/2014  . Cardiac murmur 06/11/2014  . Chronic LBP 06/11/2014  . Gastro-esophageal reflux disease without esophagitis 06/11/2014  . Decreased vision in both eyes 06/11/2014  . H/O malignant neoplasm of prostate 06/11/2014  . Aortic stenosis, moderate 03/11/2013  . Sinus tachycardia 03/11/2013    Past Surgical History:  Procedure Laterality Date  . back surgery    . COLONOSCOPY      Prior to Admission medications   Medication Sig Start Date End Date Taking? Authorizing Provider  diphenhydrAMINE (BENADRYL) 25 MG tablet Take 25 mg by mouth as needed.    Yes Historical Provider, MD  esomeprazole (NEXIUM) 10 MG packet Take 20 mg by mouth daily before breakfast.   Yes Historical Provider, MD  Ferrous Gluconate (IRON 27 PO) Take 1 tablet by mouth daily.   Yes Historical Provider, MD  KLOR-CON M20 20 MEQ tablet TAKE 1 TABLET (20 MEQ TOTAL) BY MOUTH DAILY. 04/14/16  Yes Wende Bushy, MD  levothyroxine (SYNTHROID, LEVOTHROID) 25 MCG tablet Take 25 mcg by mouth daily before breakfast.   Yes Historical Provider, MD  metolazone (ZAROXOLYN) 2.5 MG tablet Take 2.5 mg by mouth every 7 (seven) days.   Yes Historical Provider, MD  morphine (MS CONTIN) 30 MG 12 hr tablet Take 1 tablet (30 mg total) by mouth 3 (three) times daily with meals. 05/09/16 06/08/16 Yes Molli Barrows, MD  Polyethylene Glycol 3350  (CLEARLAX PO) Take 1 packet by mouth daily.   Yes Historical Provider, MD  torsemide (DEMADEX) 20 MG tablet Take 2 tablets (40 mg total) by mouth daily. 05/30/16  Yes Wende Bushy, MD    Allergies Amoxicillin and Reglan [metoclopramide]  Family History  Problem Relation Age of Onset  . Diabetes Mother   . Hypertension Mother   . Hypertension Father   . Heart disease Father   . Cancer Sister   . Heart disease Brother     Social History Social History  Substance Use Topics  . Smoking status: Current Some Day Smoker    Packs/day: 1.50    Years: 50.00    Types: Cigars  . Smokeless tobacco: Never Used  . Alcohol use No    Review of Systems Constitutional: No fever/chills Eyes: No visual changes. ENT: No sore throat. Cardiovascular: Denies chest pain. Respiratory:  shortness of breath. Gastrointestinal: No abdominal pain.  No nausea, no vomiting.  No diarrhea.  No constipation. Genitourinary: Negative for dysuria. Musculoskeletal: Negative for back pain. Skin: blisters to lower extremities Neurological: Negative for headaches, focal weakness or numbness. Lymph: Leg swelling  10-point ROS otherwise negative.  ____________________________________________   PHYSICAL EXAM:  VITAL SIGNS: ED Triage Vitals  Enc Vitals Group     BP 05/31/16 2237 100/77     Pulse Rate 05/31/16 2237 91     Resp 05/31/16 2237 (!) 22     Temp 05/31/16 2240 97.8 F (36.6 C)     Temp Source 05/31/16 2237 Oral     SpO2 05/31/16 2237 94 %     Weight 05/31/16 2240 170 lb (77.1 kg)     Height 05/31/16 2240 5\' 8"  (1.727 m)     Head Circumference --      Peak Flow --      Pain Score 05/31/16 2240 8     Pain Loc --      Pain Edu? --      Excl. in [place]? --     Constitutional: Alert and oriented. Well appearing and in mild distress. Eyes: Conjunctivae are normal. PERRL. EOMI. Head: Atraumatic. Nose: No congestion/rhinnorhea. Mouth/Throat: Mucous membranes are moist.  Oropharynx  non-erythematous. Cardiovascular: Normal rate, regular rhythm. Systolic murmur.  Good peripheral circulation. Respiratory: Normal respiratory effort.  No retractions. Crackles in bilateral bases Gastrointestinal: Soft and nontender. No distention. Positive bowel sounds Musculoskeletal: Bilateral extremity edema with some penile edema Neurologic:  Normal speech and language.  Skin:  Skin is warm, dry and intact.  Psychiatric: Mood and affect are normal.   ____________________________________________   LABS (all labs ordered are listed, but only abnormal results are displayed)  Labs Reviewed  CBC - Abnormal; Notable for the following:       Result Value   WBC 28.0 (*)    RBC 7.17 (*)    MCV 63.7 (*)    MCH 19.7 (*)    MCHC 31.0 (*)  RDW 21.6 (*)    All other components within normal limits  BRAIN NATRIURETIC PEPTIDE - Abnormal; Notable for the following:    B Natriuretic Peptide 1,557.0 (*)    All other components within normal limits  BASIC METABOLIC PANEL - Abnormal; Notable for the following:    Glucose, Bld 113 (*)    BUN 48 (*)    Creatinine, Ser 2.31 (*)    Calcium 8.6 (*)    GFR calc non Af Amer 25 (*)    GFR calc Af Amer 29 (*)    All other components within normal limits  TROPONIN I - Abnormal; Notable for the following:    Troponin I 0.18 (*)    All other components within normal limits  CULTURE, BLOOD (ROUTINE X 2)  CULTURE, BLOOD (ROUTINE X 2)   ____________________________________________  EKG  ED ECG REPORT I, Loney Hering, the attending physician, personally viewed and interpreted this ECG.   Date: 05/31/2016  EKG Time: 2242  Rate: 92  Rhythm: normal sinus rhythm  Axis: normal  Intervals:none  ST&T Change: flipped t waves in leads II, III, avf, V6  ____________________________________________  RADIOLOGY  CXR ____________________________________________   PROCEDURES  Procedure(s) performed: None  Procedures  Critical Care  performed: No  ____________________________________________   INITIAL IMPRESSION / ASSESSMENT AND PLAN / ED COURSE  Pertinent labs & imaging results that were available during my care of the patient were reviewed by me and considered in my medical decision making (see chart for details).  This is a 81 year old male with a history of congestive heart failure. The patient comes into the hospital today with some increase in his weight, increased dyspnea on exertion and swelling in his legs and penis. The patient tried to diuresis home but because of his penile swelling he is unable to urinate well. The patient does have a white blood cell count of 28,000 and given his leg redness and blisters on concerned about a possible cellulitis as well. I will give the patient a dose of vancomycin. I will give him a dose of Lasix. We did place a Foley catheter to help the patient urinate as well. He will be admitted to the hospitalist service.  Clinical Course as of Jun 01 120  Wed May 31, 2016  2326 Stable examination: RIGHT lung base scarring with pleural thickening, no acute cardiopulmonary process.   DG Chest 2 View [AW]    Clinical Course User Index [AW] Loney Hering, MD   The patient's troponin is also elevated as well as his creatinine which is likely from his inability to urinate.  ____________________________________________   FINAL CLINICAL IMPRESSION(S) / ED DIAGNOSES  Final diagnoses:  Cellulitis of lower extremity, unspecified laterality  Acute on chronic systolic congestive heart failure ([place])      NEW MEDICATIONS STARTED DURING THIS VISIT:  New Prescriptions   No medications on file     Note:  This document was prepared using Dragon voice recognition software and may include unintentional dictation errors.    Loney Hering, MD 06/01/16 [id number]    Loney Hering, MD 06/01/16 [phone]

## 2016-06-01 NOTE — Progress Notes (Signed)
Pharmacy Antibiotic Note  Nathan Kramer is a 81 y.o. male admitted on 05/31/2016 with cellulitis.  Pharmacy has been consulted for vancomycin dosing.  Plan: Vancomycin 1 gm IV x 1 in ED followed in approximately 12 hours by vancomycin 750 mg IV Q24H. Baseline SCr for 2017 appears to be 1.5 to 1.8 mg/dL; this admission SCr 2.31 mg/dL. Pharmacy will continue to follow and adjust as needed to maintain trough 10 to 15 mcg/mL.   Vd 50.3 L, Ke 0.026 hr-1, T1/2 27 hr  Height: 5\' 8"  (172.7 cm) Weight: 170 lb (77.1 kg) IBW/kg (Calculated) : 68.4  Temp (24hrs), Avg:97.8 F (36.6 C), Min:97.8 F (36.6 C), Max:97.8 F (36.6 C)   Recent Labs Lab 05/30/16 1139 05/31/16 2251 06/01/16 0020  WBC  --  28.0*  --   CREATININE 1.77*  --  2.31*    Estimated Creatinine Clearance: 25.5 mL/min (by C-G formula based on SCr of 2.31 mg/dL (H)).    Allergies  Allergen Reactions  . Amoxicillin     Other reaction(s): Localized superficial swelling of skin Face swelling   . Reglan [Metoclopramide]     Involuntary movements body    Thank you for allowing pharmacy to be a part of this patient's care.  Laural Benes, Pharm.D., BCPS Clinical Pharmacist 06/01/2016 2:52 AM

## 2016-06-01 NOTE — Progress Notes (Signed)
Chaplain provided education and an advance directive to the patient. Patient said that he did not recall requesting an AD but his wife might have requested it. Pt. Michela Pitcher that he would wait for his wife to make a decision.

## 2016-06-01 NOTE — Addendum Note (Signed)
Addended by: Britt Bottom on: 06/01/2016 07:53 AM   Modules accepted: Orders

## 2016-06-01 NOTE — ED Notes (Signed)
Pt given ice water per permission from MD.

## 2016-06-01 NOTE — Progress Notes (Signed)
[place] at [place] NAME: Nathan Kramer    MR#:  [id number]  DATE OF BIRTH:  05-27-1935  SUBJECTIVE:   Patient here due to shortness of breath and lower extremity edema noted to be in congestive heart failure. Also has underlying chronic kidney disease in poorly responsive to diuretics. Patient sitting up in the chair and says that he feels better since yesterday.  REVIEW OF SYSTEMS:    Review of Systems  Constitutional: Negative for chills and fever.  HENT: Negative for congestion and tinnitus.   Eyes: Negative for blurred vision and double vision.  Respiratory: Positive for shortness of breath. Negative for cough and wheezing.   Cardiovascular: Positive for leg swelling. Negative for chest pain, orthopnea and PND.  Gastrointestinal: Negative for abdominal pain, diarrhea, nausea and vomiting.  Genitourinary: Negative for dysuria and hematuria.  Neurological: Negative for dizziness, sensory change and focal weakness.  All other systems reviewed and are negative.   Nutrition: heart Healthy Tolerating Diet: Yes Tolerating PT: Await Eval.    DRUG ALLERGIES:   Allergies  Allergen Reactions  . Amoxicillin     Other reaction(s): Localized superficial swelling of skin Face swelling   . Reglan [Metoclopramide]     Involuntary movements body    VITALS:  Blood pressure (!) 99/46, pulse 75, temperature 97.9 F (36.6 C), temperature source Oral, resp. rate 20, height 5\' 8"  (1.727 m), weight 75.6 kg (166 lb 11.2 oz), SpO2 98 %.  PHYSICAL EXAMINATION:   Physical Exam  GENERAL:  81 y.o.-year-old patient sitting up in chair in no acute distress.  EYES: Legally blind. No scleral icterus. Extraocular muscles intact.  HEENT: Head atraumatic, normocephalic. Oropharynx and nasopharynx clear.  NECK:  Supple, no jugular venous distention. No thyroid enlargement, no tenderness.  LUNGS: Normal breath sounds bilaterally, no wheezing, bibasilar  rales, no rhonchi. No use of accessory muscles of respiration.  CARDIOVASCULAR: S1, S2 normal. II/VI SEM at LSB, rubs, or gallops.  ABDOMEN: Soft, nontender, nondistended. Bowel sounds present. No organomegaly or mass.  EXTREMITIES: No cyanosis, clubbing, + 2 edema b/l.  Signs of chronic venous stasis b/l.  NEUROLOGIC: Cranial nerves II through XII are intact. No focal Motor or sensory deficits b/l.   PSYCHIATRIC: The patient is alert and oriented x 3.  SKIN: No obvious rash, lesion, or ulcer.    LABORATORY PANEL:   CBC  Recent Labs Lab 06/01/16 0455  WBC 25.9*  HGB 13.0  HCT 40.6  PLT 258   ------------------------------------------------------------------------------------------------------------------  Chemistries   Recent Labs Lab 06/01/16 0455  NA 140  K 3.6  CL 100*  CO2 32  GLUCOSE 127*  BUN 47*  CREATININE 2.30*  CALCIUM 8.8*   ------------------------------------------------------------------------------------------------------------------  Cardiac Enzymes  Recent Labs Lab 06/01/16 1000  TROPONINI 0.16*   ------------------------------------------------------------------------------------------------------------------  RADIOLOGY:  Dg Chest 2 View  Result Date: 05/31/2016 CLINICAL DATA:  Shortness of breath, abdominal swelling. History of prostate cancer, hypertension. EXAM: CHEST  2 VIEW COMPARISON:  Chest radiograph January 12, 2016 FINDINGS: The cardiac silhouette is mildly enlarged unchanged. Mediastinal silhouette is nonsuspicious, calcified aortic knob. Persistently elevated RIGHT hemidiaphragm with RIGHT lung base scarring and bibasilar pleural thickening. Trachea projects midline and there is no pneumothorax. Soft tissue planes and included osseous structures are nonsuspicious. IMPRESSION: Stable examination: RIGHT lung base scarring with pleural thickening, no acute cardiopulmonary process. Electronically Signed   By: Elon Alas M.D.   On:  05/31/2016 23:09     ASSESSMENT AND  PLAN:   81 year old male with past medical history of hypertension, pulmonary hypertension, diastolic CHF, moderate aortic stenosis, history of GERD, prostate cancer who presented to the hospital due to shortness of breath and worsening lower extremity edema.  1. CHF- acute on chronic diastolic dysfunction with underlying pulmonary hypertension. -We'll continue diuresis with IV Lasix, minimal response so far given his chronic kidney disease. Follow I's and O's and daily weights. If not improving consider a Lasix drip as per cardiology. - not on B-blocker due to some hypotension.   2. Moderate Aortic Stenosis - as per Cards not a great surgical candidate.  - await Repeat Echo. Cont. Supportive care for now.   3. CKD Stage IV - cr. A bit worse today.  Will monitor with diuresis.  - will consult nephro if not improving.   4. Leukocytosis - ?? Etiology.  LE not likely cellulitis but chronic venous stasis.  - cont. Empiric Vancomycin.  Follow WBC count. CXR (-). Will check UA.   5. Hypothyroidism-continue Synthroid.  6. LE edema - due to CHF, venous stasis.  - Wound team consult. Cont. Lasix.   All the records are reviewed and case discussed with Care Management/Social Worker. Management plans discussed with the patient, family and they are in agreement.  CODE STATUS: DNR  DVT Prophylaxis: Hep SQ  TOTAL TIME TAKING CARE OF THIS PATIENT: 30 minutes.   POSSIBLE D/C IN 2-3 DAYS, DEPENDING ON CLINICAL CONDITION.   Henreitta Leber M.D on 06/01/2016 at 2:16 PM  Between 7am to 6pm - Pager - [phone]  After 6pm go to [email or web address] - [employer] [facility]  Office  [phone]  CC: Primary care physician; Tracie Harrier, MD

## 2016-06-02 LAB — URINALYSIS, ROUTINE W REFLEX MICROSCOPIC
BACTERIA UA: NONE SEEN
Bilirubin Urine: NEGATIVE
GLUCOSE, UA: NEGATIVE mg/dL
Ketones, ur: NEGATIVE mg/dL
LEUKOCYTES UA: NEGATIVE
NITRITE: NEGATIVE
PH: 5 (ref 5.0–8.0)
PROTEIN: 30 mg/dL — AB
Specific Gravity, Urine: 1.011 (ref 1.005–1.030)
Squamous Epithelial / LPF: NONE SEEN

## 2016-06-02 LAB — BASIC METABOLIC PANEL
Anion gap: 10 (ref 5–15)
BUN: 54 mg/dL — AB (ref 6–20)
CALCIUM: 8.7 mg/dL — AB (ref 8.9–10.3)
CHLORIDE: 100 mmol/L — AB (ref 101–111)
CO2: 28 mmol/L (ref 22–32)
CREATININE: 2.15 mg/dL — AB (ref 0.61–1.24)
GFR calc non Af Amer: 28 mL/min — ABNORMAL LOW (ref >60)
GFR, EST AFRICAN AMERICAN: 32 mL/min — AB (ref >60)
Glucose, Bld: 128 mg/dL — ABNORMAL HIGH (ref 65–99)
Potassium: 3.8 mmol/L (ref 3.5–5.1)
Sodium: 138 mmol/L (ref 135–145)

## 2016-06-02 MED ORDER — FUROSEMIDE 10 MG/ML IJ SOLN
5.0000 mg/h | INTRAVENOUS | Status: DC
  Administered 2016-06-02: 5 mg/h via INTRAVENOUS
  Administered 2016-06-03 – 2016-06-07 (×4): 8 mg/h via INTRAVENOUS
  Administered 2016-06-09 – 2016-06-11 (×4): 10 mg/h via INTRAVENOUS
  Administered 2016-06-12: 5 mg/h via INTRAVENOUS
  Filled 2016-06-02 (×10): qty 25

## 2016-06-02 NOTE — Progress Notes (Signed)
Patient scheduled to see Dr. Juleen China on July 20, 2016 at 11:40AM at [facility]

## 2016-06-02 NOTE — Consult Note (Signed)
[facility] Nurse wound consult note Reason for Consult: Bilateral venous stasis ulcers and CHF exacerbation.  Wears removable compression at home.  Wife reinforces that with self adherent Coban compression as well.  Wound type:Chronic venous stasis with blistering and weeping.   Pressure Injury POA: Yes Measurement:left anterior lower leg  4 cm x 3 cm x 0.1 cm ruptured blister Scattered nonintact blistering to right leg, anterior and posterior.  Will add absorptive alginate dressing for moisture management Wound [id number] and moist Drainage (amount, consistency, odor) serous weeping  No odor Periwound:erythema and edema noted Dressing procedure/placement/frequency:Cleanse bilateral lower legs with soap and water and pat dry.  Apply alginate dressing to blistering and weeping.  Wrap with zinc layer from below toes to below knee.  Secure with self adherent COban.   Wife instructed to resume removable compression when discharged.  Instructed not to shower with Unnas boots on and remove in one week.  Will reassess Monday if still inpatient.  Will not follow at this time.  Please re-consult if needed.  Domenic Moras RN BSN [place] Pager [phone]

## 2016-06-02 NOTE — Progress Notes (Signed)
[place] at [place] NAME: Nathan Kramer    MR#:  [id number]  DATE OF BIRTH:  June 19, 1935  SUBJECTIVE:   Patient here due to shortness of breath and lower extremity edema noted to be in congestive heart failure.  Still remains somewhat short of breath with LE edema and started on Lasix gtt by Cardiology. Wife at bedside.   REVIEW OF SYSTEMS:    Review of Systems  Constitutional: Negative for chills and fever.  HENT: Negative for congestion and tinnitus.   Eyes: Negative for blurred vision and double vision.  Respiratory: Positive for shortness of breath. Negative for cough and wheezing.   Cardiovascular: Positive for leg swelling. Negative for chest pain, orthopnea and PND.  Gastrointestinal: Negative for abdominal pain, diarrhea, nausea and vomiting.  Genitourinary: Negative for dysuria and hematuria.  Neurological: Negative for dizziness, sensory change and focal weakness.  All other systems reviewed and are negative.   Nutrition: heart Healthy Tolerating Diet: Yes Tolerating PT: Await Eval.    DRUG ALLERGIES:   Allergies  Allergen Reactions  . Amoxicillin     Other reaction(s): Localized superficial swelling of skin Face swelling   . Reglan [Metoclopramide]     Involuntary movements body    VITALS:  Blood pressure (!) 98/57, pulse 74, temperature 97.5 F (36.4 C), temperature source Oral, resp. rate 16, height 5\' 8"  (1.727 m), weight 75.6 kg (166 lb 11.2 oz), SpO2 98 %.  PHYSICAL EXAMINATION:   Physical Exam  GENERAL:  81 y.o.-year-old patient sitting up in chair in no acute distress.  EYES: Legally blind. No scleral icterus. Extraocular muscles intact.  HEENT: Head atraumatic, normocephalic. Oropharynx and nasopharynx clear.  NECK:  Supple, no jugular venous distention. No thyroid enlargement, no tenderness.  LUNGS: Normal breath sounds bilaterally, no wheezing, bibasilar rales, no rhonchi. No use of accessory muscles  of respiration.  CARDIOVASCULAR: S1, S2 normal. II/VI SEM at LSB, rubs, or gallops.  ABDOMEN: Soft, nontender, nondistended. Bowel sounds present. No organomegaly or mass.  EXTREMITIES: No cyanosis, clubbing, + 2 edema b/l.  Signs of chronic venous stasis b/l.  Legs wrapped in [place] now. NEUROLOGIC: Cranial nerves II through XII are intact. No focal Motor or sensory deficits b/l.   PSYCHIATRIC: The patient is alert and oriented x 3.  SKIN: No obvious rash, lesion, or ulcer.    LABORATORY PANEL:   CBC  Recent Labs Lab 06/01/16 0455  WBC 25.9*  HGB 13.0  HCT 40.6  PLT 258   ------------------------------------------------------------------------------------------------------------------  Chemistries   Recent Labs Lab 06/02/16 0942  NA 138  K 3.8  CL 100*  CO2 28  GLUCOSE 128*  BUN 54*  CREATININE 2.15*  CALCIUM 8.7*   ------------------------------------------------------------------------------------------------------------------  Cardiac Enzymes  Recent Labs Lab 06/01/16 1603  TROPONINI 0.14*   ------------------------------------------------------------------------------------------------------------------  RADIOLOGY:  Dg Chest 2 View  Result Date: 05/31/2016 CLINICAL DATA:  Shortness of breath, abdominal swelling. History of prostate cancer, hypertension. EXAM: CHEST  2 VIEW COMPARISON:  Chest radiograph January 12, 2016 FINDINGS: The cardiac silhouette is mildly enlarged unchanged. Mediastinal silhouette is nonsuspicious, calcified aortic knob. Persistently elevated RIGHT hemidiaphragm with RIGHT lung base scarring and bibasilar pleural thickening. Trachea projects midline and there is no pneumothorax. Soft tissue planes and included osseous structures are nonsuspicious. IMPRESSION: Stable examination: RIGHT lung base scarring with pleural thickening, no acute cardiopulmonary process. Electronically Signed   By: Elon Alas M.D.   On: 05/31/2016 23:09  ASSESSMENT AND PLAN:   81 year old male with past medical history of hypertension, pulmonary hypertension, diastolic CHF, moderate aortic stenosis, history of GERD, prostate cancer who presented to the hospital due to shortness of breath and worsening lower extremity edema.  1. CHF- acute on chronic diastolic dysfunction with underlying pulmonary hypertension. -Did not respond to IV diuretics twice daily and started on a Lasix drip today as per cardiology. -I will get a nephrology consult , follow I's and O's and daily weights. -Continue further care as per cardiology.  2. Moderate Aortic Stenosis - as per Cards not a great surgical candidate.  - Repeat Echo showing no new changes.  Follow up w/ Cards as outpatient. Cont. Supportive care for now.   3. CKD Stage IV - creatinine a bit worse than before. I will get a nephrology consult as patient is going to be started on Lasix drip.  4. Leukocytosis - ?? Etiology.  LE not likely cellulitis but chronic venous stasis. S/p UNNA boots.  - cont. Empiric Vancomycin.  Follow WBC count. CXR (-). Will check UA.  - will d/c Abx in a.m. If WBC count trending down.   5. Hypothyroidism-continue Synthroid.  6. LE edema - due to CHF, venous stasis.  - cont. Lasix. Seen by Wound team and placed UNNA boots.   All the records are reviewed and case discussed with Care Management/Social Worker. Management plans discussed with the patient, family and they are in agreement.  CODE STATUS: DNR  DVT Prophylaxis: Hep SQ  TOTAL TIME TAKING CARE OF THIS PATIENT: 25 minutes.   POSSIBLE D/C IN 1-2 DAYS, DEPENDING ON CLINICAL CONDITION.   Henreitta Leber M.D on 06/02/2016 at 3:12 PM  Between 7am to 6pm - Pager - [phone]  After 6pm go to [email or web address] - Technical brewer [place]  Office  [phone]  CC: Primary care physician; Tracie Harrier, MD

## 2016-06-02 NOTE — Care Management (Signed)
Bilateral unna boots have been placed.  Patient being started on lasix drip and nephrology consulting.  Requested overnight oximetry orders.  Patient is agreeable to home health

## 2016-06-02 NOTE — Progress Notes (Signed)
Overnight pulse ox placed on pt. Pt in chair and states he has slept in a chair for years. Pt on room air at this time.

## 2016-06-02 NOTE — Progress Notes (Signed)
Pharmacy Antibiotic Note  Nathan Kramer is a 81 y.o. male admitted on 05/31/2016 with cellulitis.  Pharmacy has been consulted for vancomycin dosing.  Plan: Continue vancomycin 750 mg iv q 24 hours. Will adjust dosing/ordere levels as necessary to maintain vancomycin trough 10-15 mcg/ml.   Height: 5\' 8"  (172.7 cm) Weight: 166 lb 11.2 oz (75.6 kg) IBW/kg (Calculated) : 68.4  Temp (24hrs), Avg:97.6 F (36.4 C), Min:97.5 F (36.4 C), Max:97.7 F (36.5 C)   Recent Labs Lab 05/30/16 1139 05/31/16 2251 06/01/16 0020 06/01/16 0455 06/02/16 0942  WBC  --  28.0*  --  25.9*  --   CREATININE 1.77*  --  2.31* 2.30* 2.15*    Estimated Creatinine Clearance: 27.4 mL/min (by C-G formula based on SCr of 2.15 mg/dL (H)).    Allergies  Allergen Reactions  . Amoxicillin     Other reaction(s): Localized superficial swelling of skin Face swelling   . Reglan [Metoclopramide]     Involuntary movements body    Thank you for allowing pharmacy to be a part of this patient's care.  Ulice Dash D, Pharm.D., BCPS Clinical Pharmacist 06/02/2016 12:41 PM

## 2016-06-02 NOTE — Progress Notes (Signed)
Progress Note  Patient Name: Nathan Kramer Date of Encounter: 06/02/2016  Primary Cardiologist: Dr. Yvone Neu  Subjective   He reports improvement in shortness of breath but continues to have significant leg edema. Blood pressure is below [id number] systolic. Renal function is stable with a creatinine of 2.3.  Inpatient Medications    Scheduled Meds: . aspirin EC  81 mg Oral Daily  . feeding supplement (ENSURE ENLIVE)  237 mL Oral TID BM  . ferrous gluconate  324 mg Oral BID WC  . heparin  5,000 Units Subcutaneous Q8H  . levothyroxine  25 mcg Oral QAC breakfast  . morphine  30 mg Oral TID WC  . potassium chloride  10 mEq Oral BID  . sodium chloride flush  3 mL Intravenous Q12H  . sodium chloride flush  3 mL Intravenous Q12H  . vancomycin  750 mg Intravenous Q24H   Continuous Infusions: . furosemide (LASIX) infusion     PRN Meds: sodium chloride, acetaminophen **OR** acetaminophen, HYDROcodone-acetaminophen, ondansetron **OR** ondansetron (ZOFRAN) IV, senna-docusate, sodium chloride flush   Vital Signs    Vitals:   06/01/16 1521 06/01/16 1931 06/02/16 0549 06/02/16 0730  BP:  (!) 95/50 (!) 94/44 (!) 92/48  Pulse:  71 72 71  Resp:  16 16 16   Temp:  97.5 F (36.4 C) 97.7 F (36.5 C) 97.7 F (36.5 C)  TempSrc:  Oral Oral Oral  SpO2: 91% 94% 91% 93%  Weight:      Height:        Intake/Output Summary (Last 24 hours) at 06/02/16 0830 Last data filed at 06/02/16 0549  Gross per 24 hour  Intake              787 ml  Output             2100 ml  Net            -1313 ml   Filed Weights   05/31/16 2240 06/01/16 0415  Weight: 170 lb (77.1 kg) 166 lb 11.2 oz (75.6 kg)    Telemetry    Normal sinus rhythm with no significant arrhythmia - Personally Reviewed  ECG    Not performed - Personally Reviewed  Physical Exam   GEN: No acute distress.   Neck: Mild JVD Cardiac: RRR, no  rubs, or gallops. 3/6 crescendo decrescendo systolic murmur in the aortic area which is mid  peaking with mildly diminished S2 Respiratory: Clear to auscultation bilaterally. GI: Soft, nontender, non-distended  MS:  No deformity. Moderate bilateral leg edema with stasis dermatitis Neuro:  Nonfocal  Psych: Normal affect   Labs    Chemistry Recent Labs Lab 05/30/16 1139 06/01/16 0020 06/01/16 0455  NA 139 141 140  K 4.3 4.3 3.6  CL 98 103 100*  CO2 25 29 32  GLUCOSE 102* 113* 127*  BUN 40* 48* 47*  CREATININE 1.77* 2.31* 2.30*  CALCIUM 8.9 8.6* 8.8*  GFRNONAA 36* 25* 26*  GFRAA 42* 29* 30*  ANIONGAP  --  9 8     Hematology Recent Labs Lab 05/31/16 2251 06/01/16 0455  WBC 28.0* 25.9*  RBC 7.17* 6.39*  HGB 14.1 13.0  HCT 45.7 40.6  MCV 63.7* 63.6*  MCH 19.7* 20.4*  MCHC 31.0* 32.0  RDW 21.6* 21.5*  PLT 281 258    Cardiac Enzymes Recent Labs Lab 06/01/16 0020 06/01/16 0455 06/01/16 1000 06/01/16 1603  TROPONINI 0.18* 0.17* 0.16* 0.14*   No results for input(s): TROPIPOC in the last 168 hours.  BNP Recent Labs Lab 05/31/16 2251  BNP 1,557.0*     DDimer No results for input(s): DDIMER in the last 168 hours.   Radiology    Dg Chest 2 View  Result Date: 05/31/2016 CLINICAL DATA:  Shortness of breath, abdominal swelling. History of prostate cancer, hypertension. EXAM: CHEST  2 VIEW COMPARISON:  Chest radiograph January 12, 2016 FINDINGS: The cardiac silhouette is mildly enlarged unchanged. Mediastinal silhouette is nonsuspicious, calcified aortic knob. Persistently elevated RIGHT hemidiaphragm with RIGHT lung base scarring and bibasilar pleural thickening. Trachea projects midline and there is no pneumothorax. Soft tissue planes and included osseous structures are nonsuspicious. IMPRESSION: Stable examination: RIGHT lung base scarring with pleural thickening, no acute cardiopulmonary process. Electronically Signed   By: Elon Alas M.D.   On: 05/31/2016 23:09    Cardiac Studies   Echocardiogram was personally reviewed by me which showed an  EF of 50-55% with grade 2 diastolic dysfunction, moderate to severe aortic stenosis and moderate pulmonary hypertension.  Patient Profile     81 y.o. male chronic diastolic heart failure with pulmonary hypertension, moderate to severe aortic stenosis and chronic kidney disease who presented with at least 20 pounds of weight cane with severe leg edema and increased shortness of breath.  Assessment & Plan    1. Acute on chronic diastolic heart failure with pulmonary hypertension: The patient is -1.5 L for admission but diuresis has been limited by hypotension and chronic kidney disease. His systolic blood pressure is consistently below 100. He continues to be volume overloaded and thus I decided to switch him to furosemide drip at 5 mg per hour. Monitor response and renal function closely and adjust the dose as needed.  2. Acute on chronic kidney disease: Likely due to cardiorenal syndrome. I recommend nephrology consultation to evaluate for other etiologies.  3. Moderate to severe aortic stenosis: This does not seem to be to the point of requiring intervention. Continue to monitor with echocardiogram every 6 months.  Signed, Kathlyn Sacramento, MD  06/02/2016, 8:30 AM

## 2016-06-02 NOTE — Progress Notes (Signed)
Initial HF Clinic appointment scheduled on June 09, 2016 at 9:40am. Thank you for the referral.

## 2016-06-02 NOTE — Care Management Important Message (Signed)
Important Message  Patient Details  Name: Nathan Kramer MRN: [id number] Date of Birth: March 06, 1936   Medicare Important Message Given:  Yes  Initial signed IM printed from Epic and given to patient.     Katrina Stack, RN 06/02/2016, 11:17 AM

## 2016-06-03 DIAGNOSIS — N179 Acute kidney failure, unspecified: Secondary | ICD-10-CM

## 2016-06-03 DIAGNOSIS — N189 Chronic kidney disease, unspecified: Secondary | ICD-10-CM

## 2016-06-03 DIAGNOSIS — Z9111 Patient's noncompliance with dietary regimen: Secondary | ICD-10-CM

## 2016-06-03 DIAGNOSIS — I130 Hypertensive heart and chronic kidney disease with heart failure and stage 1 through stage 4 chronic kidney disease, or unspecified chronic kidney disease: Principal | ICD-10-CM

## 2016-06-03 LAB — BASIC METABOLIC PANEL
ANION GAP: 13 (ref 5–15)
BUN: 57 mg/dL — ABNORMAL HIGH (ref 6–20)
CALCIUM: 8.8 mg/dL — AB (ref 8.9–10.3)
CO2: 27 mmol/L (ref 22–32)
Chloride: 97 mmol/L — ABNORMAL LOW (ref 101–111)
Creatinine, Ser: 1.98 mg/dL — ABNORMAL HIGH (ref 0.61–1.24)
GFR, EST AFRICAN AMERICAN: 35 mL/min — AB (ref >60)
GFR, EST NON AFRICAN AMERICAN: 31 mL/min — AB (ref >60)
GLUCOSE: 110 mg/dL — AB (ref 65–99)
POTASSIUM: 4.5 mmol/L (ref 3.5–5.1)
Sodium: 137 mmol/L (ref 135–145)

## 2016-06-03 LAB — HEPATIC FUNCTION PANEL
ALBUMIN: 3.3 g/dL — AB (ref 3.5–5.0)
ALT: 11 U/L — AB (ref 17–63)
AST: 42 U/L — AB (ref 15–41)
Alkaline Phosphatase: 120 U/L (ref 38–126)
BILIRUBIN DIRECT: 0.3 mg/dL (ref 0.1–0.5)
Indirect Bilirubin: 0.7 mg/dL (ref 0.3–0.9)
Total Bilirubin: 1 mg/dL (ref 0.3–1.2)
Total Protein: 6.1 g/dL — ABNORMAL LOW (ref 6.5–8.1)

## 2016-06-03 LAB — CBC
HCT: 43 % (ref 40.0–52.0)
Hemoglobin: 13.2 g/dL (ref 13.0–18.0)
MCH: 19.6 pg — ABNORMAL LOW (ref 26.0–34.0)
MCHC: 30.6 g/dL — ABNORMAL LOW (ref 32.0–36.0)
MCV: 64.1 fL — ABNORMAL LOW (ref 80.0–100.0)
PLATELETS: 271 10*3/uL (ref 150–440)
RBC: 6.71 MIL/uL — AB (ref 4.40–5.90)
RDW: 21.7 % — ABNORMAL HIGH (ref 11.5–14.5)
WBC: 27.9 10*3/uL — AB (ref 3.8–10.6)

## 2016-06-03 MED ORDER — ALBUMIN HUMAN 25 % IV SOLN
12.5000 g | Freq: Two times a day (BID) | INTRAVENOUS | Status: AC
  Administered 2016-06-03 – 2016-06-04 (×4): 12.5 g via INTRAVENOUS
  Filled 2016-06-03 (×4): qty 50

## 2016-06-03 NOTE — Progress Notes (Signed)
Patient: Nathan Kramer / Admit Date: 05/31/2016 / Date of Encounter: 06/03/2016, 4:57 PM   Hospital Problem List     Principal Problem:   Acute on chronic diastolic CHF (congestive heart failure), NYHA class 3 ([place]) Active Problems:   Aortic stenosis, moderate   HTN (hypertension)   Acute kidney injury superimposed on CKD (HCC)   Cellulitis   Dietary noncompliance     Subjective   Tremendous pitting leg swelling through the thighs/woody edema Pitting Sacral edema also noted This only recently taking torsemide 20 mg daily with low-dose metolazone once a week Reports he was not urinating very much Wife recently surprised at home that his scrotum was edematous. Was having difficulty urinating and that is what brought him into the hospital He has required indwelling Foley catheter 1 L out on Lasix infusion, rate increased this morning   Inpatient Medications    Scheduled Meds: . albumin human  12.5 g Intravenous Q12H  . aspirin EC  81 mg Oral Daily  . feeding supplement (ENSURE ENLIVE)  237 mL Oral TID BM  . ferrous gluconate  324 mg Oral BID WC  . heparin  5,000 Units Subcutaneous Q8H  . levothyroxine  25 mcg Oral QAC breakfast  . morphine  30 mg Oral TID WC  . potassium chloride  10 mEq Oral BID  . sodium chloride flush  3 mL Intravenous Q12H  . sodium chloride flush  3 mL Intravenous Q12H   Continuous Infusions: . furosemide (LASIX) infusion 8 mg/hr (06/03/16 1046)   PRN Meds: sodium chloride, acetaminophen **OR** acetaminophen, HYDROcodone-acetaminophen, ondansetron **OR** ondansetron (ZOFRAN) IV, senna-docusate, sodium chloride flush   Objective: Telemetry:  Physical Exam: Blood pressure (!) 107/50, pulse 75, temperature 97.6 F (36.4 C), temperature source Oral, resp. rate 16, height 5\' 8"  (1.727 m), weight 166 lb 11.2 oz (75.6 kg), SpO2 96 %. Body mass index is 25.35 kg/m. GEN: Thin  cachectic, in no acute distress.  HEENT: Grossly normal.  Neck: Supple,   JVD 12+ cm, no carotid bruits, or masses. Cardiac: RRR,S4, 3/6 SEM RSB,   no rubs, or gallops. No clubbing, cyanosis 2+ pitting edema to the thighs, 1+ sacral pitting edema Respiratory:  Respirations regular and unlabored, clear to auscultation bilaterally. GI: Soft, nontender, nondistended, BS + x 4. MS: no deformity or atrophy. Skin: warm and dry, no rash. Neuro:  Strength and sensation are intact. Psych: AAOx3.  Normal affect.   Intake/Output Summary (Last 24 hours) at 06/03/16 1657 Last data filed at 06/03/16 1300  Gross per 24 hour  Intake              910 ml  Output             1400 ml  Net             -490 ml     Labs: CBC  Recent Labs  06/01/16 0455 06/03/16 0524  WBC 25.9* 27.9*  HGB 13.0 13.2  HCT 40.6 43.0  MCV 63.6* 64.1*  PLT 258 [id number]   Basic Metabolic Panel  Recent Labs  06/02/16 0942 06/03/16 0524  NA 138 137  K 3.8 4.5  CL 100* 97*  CO2 28 27  GLUCOSE 128* 110*  BUN 54* 57*  CREATININE 2.15* 1.98*  CALCIUM 8.7* 8.8*   Liver Function Tests  Recent Labs  06/03/16 0524  AST 42*  ALT 11*  ALKPHOS 120  BILITOT 1.0  PROT 6.1*  ALBUMIN 3.3*   No results  for input(s): LIPASE, AMYLASE in the last 72 hours. Cardiac Enzymes  Recent Labs  06/01/16 0455 06/01/16 1000 06/01/16 1603  TROPONINI 0.17* 0.16* 0.14*   BNP Invalid input(s): POCBNP D-Dimer No results for input(s): DDIMER in the last 72 hours. Hemoglobin A1C No results for input(s): HGBA1C in the last 72 hours. Fasting Lipid Panel No results for input(s): CHOL, HDL, LDLCALC, TRIG, CHOLHDL, LDLDIRECT in the last 72 hours. Thyroid Function Tests No results for input(s): TSH, T4TOTAL, T3FREE, THYROIDAB in the last 72 hours.  Invalid input(s): FREET3  Weights: Filed Weights   05/31/16 2240 06/01/16 0415  Weight: 170 lb (77.1 kg) 166 lb 11.2 oz (75.6 kg)     Radiology/Studies:  Dg Chest 2 View  Result Date: 05/31/2016 CLINICAL DATA:  Shortness of breath, abdominal  swelling. History of prostate cancer, hypertension. EXAM: CHEST  2 VIEW COMPARISON:  Chest radiograph January 12, 2016 FINDINGS: The cardiac silhouette is mildly enlarged unchanged. Mediastinal silhouette is nonsuspicious, calcified aortic knob. Persistently elevated RIGHT hemidiaphragm with RIGHT lung base scarring and bibasilar pleural thickening. Trachea projects midline and there is no pneumothorax. Soft tissue planes and included osseous structures are nonsuspicious. IMPRESSION: Stable examination: RIGHT lung base scarring with pleural thickening, no acute cardiopulmonary process. Electronically Signed   By: Elon Alas M.D.   On: 05/31/2016 23:09     Assessment and Plan  81 y.o. male  1. Acute on chronic diastolic heart failure with pulmonary hypertension:  Tremendous pitting edema extending up to his sacrum Agree with continuing Lasix infusion with higher rate Will likely require more than 10 pounds of diuresis Would weigh daily, track pitting leg edema Closely follow renal function, electrolytes  2. Acute on chronic kidney disease:  Likely component of cardiorenal syndrome Would continue Lasix infusion  3. Moderate to severe aortic stenosis:   echocardiogram every 6 months. Likely contributing to some degree to his acute on chronic diastolic CHF  4) cachexia Muscle wasting   Total encounter time more than 25 minutes  Greater than 50% was spent in counseling and coordination of care with the patient   Signed, Esmond Plants, MD, Ph.D. [facility] 06/03/2016, 4:57 PM

## 2016-06-03 NOTE — Progress Notes (Signed)
[place] [facility] [place] NAME: Nathan Kramer    MR#:  [id number]  DATE OF BIRTH:  01-11-1936  SUBJECTIVE:   Patient here due to shortness of breath and lower extremity edema noted to be in congestive heart failure.  Started on Lasix gtt and minimally response but clinically feels better. Remains volume overloaded. Cr. Stable.    REVIEW OF SYSTEMS:    Review of Systems  Constitutional: Negative for chills and fever.  HENT: Negative for congestion and tinnitus.   Eyes: Negative for blurred vision and double vision.  Respiratory: Positive for shortness of breath. Negative for cough and wheezing.   Cardiovascular: Positive for leg swelling. Negative for chest pain, orthopnea and PND.  Gastrointestinal: Negative for abdominal pain, diarrhea, nausea and vomiting.  Genitourinary: Negative for dysuria and hematuria.  Neurological: Negative for dizziness, sensory change and focal weakness.  All other systems reviewed and are negative.   Nutrition: heart Healthy Tolerating Diet: Yes Tolerating PT: Await Eval.    DRUG ALLERGIES:   Allergies  Allergen Reactions  . Amoxicillin     Other reaction(s): Localized superficial swelling of skin Face swelling   . Reglan [Metoclopramide]     Involuntary movements body    VITALS:  Blood pressure 97/60, pulse 73, temperature 97.8 F (36.6 C), temperature source Oral, resp. rate 20, height 5\' 8"  (1.727 m), weight 75.6 kg (166 lb 11.2 oz), SpO2 96 %.  PHYSICAL EXAMINATION:   Physical Exam  GENERAL:  81 y.o.-year-old patient sitting up in chair in no acute distress.  EYES: Legally blind. No scleral icterus. Extraocular muscles intact.  HEENT: Head atraumatic, normocephalic. Oropharynx and nasopharynx clear.  NECK:  Supple, no jugular venous distention. No thyroid enlargement, no tenderness.  LUNGS: Normal breath sounds bilaterally, no wheezing, bibasilar rales, no rhonchi. No use of accessory muscles of  respiration.  CARDIOVASCULAR: S1, S2 normal. II/VI SEM at LSB, rubs, or gallops.  ABDOMEN: Soft, nontender, nondistended. Bowel sounds present. No organomegaly or mass.  EXTREMITIES: No cyanosis, clubbing, + 2 edema b/l.  Signs of chronic venous stasis b/l.  Legs wrapped in [place] now. NEUROLOGIC: Cranial nerves II through XII are intact. No focal Motor or sensory deficits b/l.   PSYCHIATRIC: The patient is alert and oriented x 3.  SKIN: No obvious rash, lesion, or ulcer.    LABORATORY PANEL:   CBC  Recent Labs Lab 06/03/16 0524  WBC 27.9*  HGB 13.2  HCT 43.0  PLT 271   ------------------------------------------------------------------------------------------------------------------  Chemistries   Recent Labs Lab 06/03/16 0524  NA 137  K 4.5  CL 97*  CO2 27  GLUCOSE 110*  BUN 57*  CREATININE 1.98*  CALCIUM 8.8*  AST 42*  ALT 11*  ALKPHOS 120  BILITOT 1.0   ------------------------------------------------------------------------------------------------------------------  Cardiac Enzymes  Recent Labs Lab 06/01/16 1603  TROPONINI 0.14*   ------------------------------------------------------------------------------------------------------------------  RADIOLOGY:  No results found.   ASSESSMENT AND PLAN:   81 year old male with past medical history of hypertension, pulmonary hypertension, diastolic CHF, moderate aortic stenosis, history of GERD, prostate cancer who presented to the hospital due to shortness of breath and worsening lower extremity edema.  1. CHF- acute on chronic diastolic dysfunction with underlying pulmonary hypertension. -Did not respond to IV diuretics twice daily and started on a Lasix drip and remains poorly responsive but clinically feels better.  - await further input from Cardiology/Nephrology.  ?? Adding another diuretic or maybe an inotrope but pt. Has diastolic dysfunction.  -  follow I's and O's and daily weights.  2.  Moderate Aortic Stenosis - as per Cards not a great surgical candidate.  - Repeat Echo showing no new changes.  Follow up w/ Cards as outpatient. Cont. Supportive care for now.   3. CKD Stage IV - creatinine close to baseline but now on Lasix gtt.  - Nephrology consulted and will await input.   4. Leukocytosis - ?? Etiology.  LE not likely cellulitis but chronic venous stasis. S/p UNNA boots.  - WBC count still up but no clinical sign of infection. Will d/c Vanc. And follow WBC count.  -  CXR (-). UA (-).    5. Hypothyroidism-continue Synthroid.  6. LE edema - due to CHF, venous stasis.  - cont. Lasix. Seen by Wound team and placed UNNA boots.   All the records are reviewed and case discussed with Care Management/Social Worker. Management plans discussed with the patient, family and they are in agreement.  CODE STATUS: DNR  DVT Prophylaxis: Hep SQ  TOTAL TIME TAKING CARE OF THIS PATIENT: 30 minutes.   POSSIBLE D/C IN 2-3 DAYS, DEPENDING ON CLINICAL CONDITION.   Henreitta Leber M.D on 06/03/2016 at 12:09 PM  Between 7am to 6pm - Pager - [phone]  After 6pm go to [email or web address] - [employer] [facility]  Office  [phone]  CC: Primary care physician; Tracie Harrier, MD

## 2016-06-03 NOTE — Consult Note (Signed)
[facility]  CONSULT NOTE    Date: 06/03/2016                  Patient Name:  Nathan Kramer  MRN: [id number]  DOB: 1935-10-25  Age / Sex: 81 y.o., male         PCP: Tracie Harrier, MD                 Service Requesting Consult: Dr. Verdell Carmine                 Reason for Consult: Acute renal failure on chronic kidney disease stage III            History of Present Illness: [name] is a 81 y.o. white male with diastolic congestive heart failure, hypertension, CVA, BPH, , who was admitted to [facility] on 05/31/2016 for Acute on chronic systolic congestive heart failure ([place]) [I50.23] Cellulitis of lower extremity, unspecified laterality [L03.119]   Patient with increasing peripheral edema. Furosemide gtt started yesterday. Urine output 1150. Creatinine 1.98 (2.15). Baseline creatinine of 1.77 from 05/11/16 with GFR of 34.   Patient was outpatient torsemide.     Medications: Outpatient medications: Prescriptions Prior to Admission  Medication Sig Dispense Refill Last Dose  . diphenhydrAMINE (BENADRYL) 25 MG tablet Take 25 mg by mouth as needed.    Taking  . esomeprazole (NEXIUM) 10 MG packet Take 20 mg by mouth daily before breakfast.   05/31/2016 at Unknown time  . Ferrous Gluconate (IRON 27 PO) Take 1 tablet by mouth daily.   05/31/2016 at Unknown time  . KLOR-CON M20 20 MEQ tablet TAKE 1 TABLET (20 MEQ TOTAL) BY MOUTH DAILY. 90 tablet 3 05/31/2016 at Unknown time  . levothyroxine (SYNTHROID, LEVOTHROID) 25 MCG tablet Take 25 mcg by mouth daily before breakfast.   05/31/2016 at Unknown time  . metolazone (ZAROXOLYN) 2.5 MG tablet Take 2.5 mg by mouth every 7 (seven) days.   Past Week at Unknown time  . morphine (MS CONTIN) 30 MG 12 hr tablet Take 1 tablet (30 mg total) by mouth 3 (three) times daily with meals. 90 tablet 0 05/31/2016 at Unknown time  . Polyethylene Glycol 3350 (CLEARLAX PO) Take 1 packet by mouth daily.   Taking  . torsemide (DEMADEX) 20 MG  tablet Take 2 tablets (40 mg total) by mouth daily. 60 tablet 3 05/31/2016 at Unknown time    Current medications: Current Facility-Administered Medications  Medication Dose Route Frequency Provider Last Rate Last Dose  . 0.9 %  sodium chloride infusion  250 mL Intravenous PRN Saundra Shelling, MD      . acetaminophen (TYLENOL) tablet 650 mg  650 mg Oral Q6H PRN Saundra Shelling, MD       Or  . acetaminophen (TYLENOL) suppository 650 mg  650 mg Rectal Q6H PRN Saundra Shelling, MD      . aspirin EC tablet 81 mg  81 mg Oral Daily Pavan Pyreddy, MD   81 mg at 06/03/16 1008  . feeding supplement (ENSURE ENLIVE) (ENSURE ENLIVE) liquid 237 mL  237 mL Oral TID BM Henreitta Leber, MD   237 mL at 06/03/16 1008  . ferrous gluconate (FERGON) tablet 324 mg  324 mg Oral BID WC Saundra Shelling, MD   324 mg at 06/03/16 0816  . furosemide (LASIX) 250 mg in dextrose 5 % 250 mL (1 mg/mL) infusion  8 mg/hr Intravenous Continuous Lavonia Dana, MD 8 mL/hr at 06/03/16 1046 8 mg/hr at  06/03/16 1046  . heparin injection 5,000 Units  5,000 Units Subcutaneous Q8H Saundra Shelling, MD   5,000 Units at 06/03/16 0525  . HYDROcodone-acetaminophen (NORCO/VICODIN) 5-325 MG per tablet 1-2 tablet  1-2 tablet Oral Q4H PRN Saundra Shelling, MD   2 tablet at 06/03/16 0345  . levothyroxine (SYNTHROID, LEVOTHROID) tablet 25 mcg  25 mcg Oral QAC breakfast Saundra Shelling, MD   25 mcg at 06/03/16 0814  . morphine (MS CONTIN) 12 hr tablet 30 mg  30 mg Oral TID WC Saundra Shelling, MD   30 mg at 06/03/16 0525  . ondansetron (ZOFRAN) tablet 4 mg  4 mg Oral Q6H PRN Saundra Shelling, MD       Or  . ondansetron (ZOFRAN) injection 4 mg  4 mg Intravenous Q6H PRN Pavan Pyreddy, MD      . potassium chloride (K-DUR,KLOR-CON) CR tablet 10 mEq  10 mEq Oral BID Areta Haber Dunn, PA-C   10 mEq at 06/03/16 1008  . senna-docusate (Senokot-S) tablet 1 tablet  1 tablet Oral QHS PRN Saundra Shelling, MD   1 tablet at 06/01/16 1440  . sodium chloride flush (NS) 0.9 % injection 3 mL  3  mL Intravenous Q12H Pavan Pyreddy, MD   3 mL at 06/02/16 2200  . sodium chloride flush (NS) 0.9 % injection 3 mL  3 mL Intravenous Q12H Pavan Pyreddy, MD   3 mL at 06/02/16 1016  . sodium chloride flush (NS) 0.9 % injection 3 mL  3 mL Intravenous PRN Saundra Shelling, MD          Allergies: Allergies  Allergen Reactions  . Amoxicillin     Other reaction(s): Localized superficial swelling of skin Face swelling   . Reglan [Metoclopramide]     Involuntary movements body      Past Medical History: Past Medical History:  Diagnosis Date  . Anemia   . Aortic insufficiency   . Aortic stenosis   . Chronic diastolic CHF (congestive heart failure) ([place]) 01/2016  . Mitral regurgitation   . Other chronic pain   . Prostate cancer ([place])   . Reflux esophagitis   . Stroke ([place])   . Unspecified essential hypertension   . Unspecified visual loss      Past Surgical History: Past Surgical History:  Procedure Laterality Date  . back surgery    . COLONOSCOPY       Family History: Family History  Problem Relation Age of Onset  . Diabetes Mother   . Hypertension Mother   . Hypertension Father   . Heart disease Father   . Cancer Sister   . Heart disease Brother      Social History: Social History   Social History  . Marital status: Married    Spouse name: N/A  . Number of children: N/A  . Years of education: N/A   Occupational History  . retired    Social History Main Topics  . Smoking status: Current Some Day Smoker    Packs/day: 1.50    Years: 50.00    Types: Cigars  . Smokeless tobacco: Never Used  . Alcohol use No  . Drug use: No  . Sexual activity: Not on file   Other Topics Concern  . Not on file   Social History Narrative  . No narrative on file     Review of Systems: Review of Systems  Constitutional: Negative.  Negative for chills, diaphoresis, fever, malaise/fatigue and weight loss.  HENT: Negative.  Negative for congestion, ear discharge,  ear pain,  hearing loss, nosebleeds, sinus pain, sore throat and tinnitus.   Eyes: Negative for blurred vision, double vision, photophobia, pain, discharge and redness.  Respiratory: Positive for shortness of breath. Negative for cough, hemoptysis, sputum production, wheezing and stridor.   Cardiovascular: Positive for leg swelling and PND. Negative for chest pain, palpitations, orthopnea and claudication.  Gastrointestinal: Negative for abdominal pain, blood in stool, constipation, diarrhea, heartburn, melena, nausea and vomiting.  Genitourinary: Negative.  Negative for dysuria, flank pain, frequency, hematuria and urgency.  Musculoskeletal: Negative.  Negative for back pain, falls, joint pain, myalgias and neck pain.  Skin: Negative.  Negative for itching and rash.  Neurological: Negative.  Negative for dizziness, tingling, tremors, sensory change, speech change, focal weakness, seizures, loss of consciousness, weakness and headaches.  Endo/Heme/Allergies: Negative for environmental allergies and polydipsia. Does not bruise/bleed easily.  Psychiatric/Behavioral: Negative.  Negative for depression, hallucinations, memory loss, substance abuse and suicidal ideas. The patient is not nervous/anxious and does not have insomnia.     Vital Signs: Blood pressure (!) 107/50, pulse 75, temperature 97.6 F (36.4 C), temperature source Oral, resp. rate 16, height 5\' 8"  (1.727 m), weight 75.6 kg (166 lb 11.2 oz), SpO2 96 %.  Weight trends: Filed Weights   05/31/16 2240 06/01/16 0415  Weight: 77.1 kg (170 lb) 75.6 kg (166 lb 11.2 oz)    Physical Exam: General: NAD, sitting in chair  Head: Normocephalic, atraumatic. Moist oral mucosal membranes  Eyes: Anicteric, PERRL  Neck: Supple, trachea midline  Lungs:  Clear to auscultation  Heart: Regular rate and rhythm  Abdomen:  Soft, nontender, abdominal wall edema ++  Extremities:  3+ peripheral edema.  Neurologic: Nonfocal, moving all four extremities  Skin: No  lesions  GU: Foley with clear yellow urine     Lab results: Basic Metabolic Panel:  Recent Labs Lab 06/01/16 0455 06/02/16 0942 06/03/16 0524  NA 140 138 137  K 3.6 3.8 4.5  CL 100* 100* 97*  CO2 32 28 27  GLUCOSE 127* 128* 110*  BUN 47* 54* 57*  CREATININE 2.30* 2.15* 1.98*  CALCIUM 8.8* 8.7* 8.8*    Liver Function Tests:  Recent Labs Lab 06/03/16 0524  AST 42*  ALT 11*  ALKPHOS 120  BILITOT 1.0  PROT 6.1*  ALBUMIN 3.3*   No results for input(s): LIPASE, AMYLASE in the last 168 hours. No results for input(s): AMMONIA in the last 168 hours.  CBC:  Recent Labs Lab 05/31/16 2251 06/01/16 0455 06/03/16 0524  WBC 28.0* 25.9* 27.9*  HGB 14.1 13.0 13.2  HCT 45.7 40.6 43.0  MCV 63.7* 63.6* 64.1*  PLT 281 258 271    Cardiac Enzymes:  Recent Labs Lab 06/01/16 0020 06/01/16 0455 06/01/16 1000 06/01/16 1603  TROPONINI 0.18* 0.17* 0.16* 0.14*    BNP: Invalid input(s): POCBNP  CBG: No results for input(s): GLUCAP in the last 168 hours.  Microbiology: Results for orders placed or performed during the hospital encounter of 05/31/16  Blood culture (routine x 2)     Status: None (Preliminary result)   Collection Time: 06/01/16 12:20 AM  Result Value Ref Range Status   Specimen Description BLOOD  L WRIST  Final   Special Requests BOTTLES DRAWN AEROBIC AND ANAEROBIC  ADEQUATE   Final   Culture NO GROWTH 2 DAYS  Final   Report Status PENDING  Incomplete  Blood culture (routine x 2)     Status: None (Preliminary result)   Collection Time: 06/01/16  1:11 AM  Result Value Ref Range Status   Specimen Description BLOOD  R FOREARM  Final   Special Requests BOTTLES DRAWN AEROBIC AND ANAEROBIC  ADEQUATE  Final   Culture NO GROWTH 2 DAYS  Final   Report Status PENDING  Incomplete    Coagulation Studies: No results for input(s): LABPROT, INR in the last 72 hours.  Urinalysis:  Recent Labs  06/02/16 1614  COLORURINE YELLOW*  LABSPEC 1.011  PHURINE 5.0   GLUCOSEU NEGATIVE  HGBUR MODERATE*  BILIRUBINUR NEGATIVE  KETONESUR NEGATIVE  PROTEINUR 30*  NITRITE NEGATIVE  LEUKOCYTESUR NEGATIVE      Imaging:  No results found.   Assessment & Plan: Mr. ARIS DEROUSSE is a 81 y.o. white male with diastolic congestive heart failure, hypertension, CVA, BPH, , who was admitted to [facility] on 05/31/2016 for Acute on chronic systolic congestive heart failure ([place]) [I50.23] Cellulitis of lower extremity, unspecified laterality [L03.119]   1. Acute renal failure on chronic kidney disease stage III: with proteinuria, anasarca.  Chronic kidney disease secondary to hypertension. Acute renal failure from acute cardiorenal syndrome from diastolic congestive heart failure.  - Increase furosemide gtt to 8mg /hr - albumin is low, will start IV albumin.   2. Hypertension and acute exacerbation of diastolic congestive heart failure and anasarca - UNNA boots - low salt diet - fluid restriction - Furosemide gtt - low threshold to restart spironolactone.   3. Cellulitis: with leukocytosis - received vanco 3/1 to 3/2  LOS: [place], [place] 3/3/201812:43 PM

## 2016-06-04 DIAGNOSIS — I10 Essential (primary) hypertension: Secondary | ICD-10-CM

## 2016-06-04 LAB — RENAL FUNCTION PANEL
ANION GAP: 11 (ref 5–15)
Albumin: 3.6 g/dL (ref 3.5–5.0)
BUN: 63 mg/dL — ABNORMAL HIGH (ref 6–20)
CHLORIDE: 98 mmol/L — AB (ref 101–111)
CO2: 30 mmol/L (ref 22–32)
CREATININE: 1.83 mg/dL — AB (ref 0.61–1.24)
Calcium: 9 mg/dL (ref 8.9–10.3)
GFR calc Af Amer: 39 mL/min — ABNORMAL LOW (ref >60)
GFR calc non Af Amer: 34 mL/min — ABNORMAL LOW (ref >60)
Glucose, Bld: 101 mg/dL — ABNORMAL HIGH (ref 65–99)
POTASSIUM: 3.7 mmol/L (ref 3.5–5.1)
Phosphorus: 3.4 mg/dL (ref 2.5–4.6)
Sodium: 139 mmol/L (ref 135–145)

## 2016-06-04 NOTE — Progress Notes (Signed)
Pts. Sat were 90, 2LNC applied sats increased to 94. Will continue to monitor.

## 2016-06-04 NOTE — Progress Notes (Signed)
Patient: Nathan Kramer / Admit Date: 05/31/2016 / Date of Encounter: 06/04/2016, 1:33 PM   Hospital Problem List     Principal Problem:   Acute on chronic diastolic CHF (congestive heart failure), NYHA class 3 ([place]) Active Problems:   Aortic stenosis, moderate   HTN (hypertension)   Acute kidney injury superimposed on CKD (HCC)   Cellulitis   Dietary noncompliance     Subjective   Patient reports mild improvement in scrotal swelling Still with tremendous pitting leg swelling through the thighs/woody edema Pitting Sacral edema  On Lasix infusion, rate increased yesterday with improved urine output 1.5 L negative past 24 hours   Inpatient Medications    Scheduled Meds: . albumin human  12.5 g Intravenous Q12H  . aspirin EC  81 mg Oral Daily  . feeding supplement (ENSURE ENLIVE)  237 mL Oral TID BM  . ferrous gluconate  324 mg Oral BID WC  . heparin  5,000 Units Subcutaneous Q8H  . levothyroxine  25 mcg Oral QAC breakfast  . morphine  30 mg Oral TID WC  . potassium chloride  10 mEq Oral BID  . sodium chloride flush  3 mL Intravenous Q12H  . sodium chloride flush  3 mL Intravenous Q12H   Continuous Infusions: . furosemide (LASIX) infusion 8 mg/hr (06/03/16 2306)   PRN Meds: sodium chloride, acetaminophen **OR** acetaminophen, HYDROcodone-acetaminophen, ondansetron **OR** ondansetron (ZOFRAN) IV, senna-docusate, sodium chloride flush   Objective: Telemetry:  Physical Exam: Blood pressure (!) 97/55, pulse 88, temperature 97.8 F (36.6 C), temperature source Oral, resp. rate 16, height 5\' 8"  (1.727 m), weight 171 lb 6.4 oz (77.7 kg), SpO2 97 %. Body mass index is 26.06 kg/m. GEN: Thin  cachectic, in no acute distress.  HEENT: Grossly normal.  Neck: Supple,  JVD 12+ cm, no carotid bruits, or masses. Cardiac: RRR,S4, 3/6 SEM RSB,   no rubs, or gallops. No clubbing, cyanosis 2+ pitting edema to the thighs, 1+ sacral pitting edema Respiratory:  Respirations regular  and unlabored, clear with dullness at the bases GI: Soft, nontender, nondistended, BS + x 4. MS: no deformity or atrophy. Skin: warm and dry, no rash. Neuro:  Strength and sensation are intact. Psych: AAOx3.  Normal affect.   Intake/Output Summary (Last 24 hours) at 06/04/16 1333 Last data filed at 06/04/16 0900  Gross per 24 hour  Intake              452 ml  Output             1550 ml  Net            -1098 ml     Labs: CBC  Recent Labs  06/03/16 0524  WBC 27.9*  HGB 13.2  HCT 43.0  MCV 64.1*  PLT 99991111   Basic Metabolic Panel  Recent Labs  06/03/16 0524 06/04/16 0444  NA 137 139  K 4.5 3.7  CL 97* 98*  CO2 27 30  GLUCOSE 110* 101*  BUN 57* 63*  CREATININE 1.98* 1.83*  CALCIUM 8.8* 9.0  PHOS  --  3.4   Liver Function Tests  Recent Labs  06/03/16 0524 06/04/16 0444  AST 42*  --   ALT 11*  --   ALKPHOS 120  --   BILITOT 1.0  --   PROT 6.1*  --   ALBUMIN 3.3* 3.6   No results for input(s): LIPASE, AMYLASE in the last 72 hours. Cardiac Enzymes  Recent Labs  06/01/16 1603  TROPONINI 0.14*   BNP Invalid input(s): POCBNP D-Dimer No results for input(s): DDIMER in the last 72 hours. Hemoglobin A1C No results for input(s): HGBA1C in the last 72 hours. Fasting Lipid Panel No results for input(s): CHOL, HDL, LDLCALC, TRIG, CHOLHDL, LDLDIRECT in the last 72 hours. Thyroid Function Tests No results for input(s): TSH, T4TOTAL, T3FREE, THYROIDAB in the last 72 hours.  Invalid input(s): FREET3  Weights: Filed Weights   05/31/16 2240 06/01/16 0415 06/04/16 0321  Weight: 170 lb (77.1 kg) 166 lb 11.2 oz (75.6 kg) 171 lb 6.4 oz (77.7 kg)     Radiology/Studies:  Dg Chest 2 View  Result Date: 05/31/2016 CLINICAL DATA:  Shortness of breath, abdominal swelling. History of prostate cancer, hypertension. EXAM: CHEST  2 VIEW COMPARISON:  Chest radiograph January 12, 2016 FINDINGS: The cardiac silhouette is mildly enlarged unchanged. Mediastinal silhouette  is nonsuspicious, calcified aortic knob. Persistently elevated RIGHT hemidiaphragm with RIGHT lung base scarring and bibasilar pleural thickening. Trachea projects midline and there is no pneumothorax. Soft tissue planes and included osseous structures are nonsuspicious. IMPRESSION: Stable examination: RIGHT lung base scarring with pleural thickening, no acute cardiopulmonary process. Electronically Signed   By: Elon Alas M.D.   On: 05/31/2016 23:09     Assessment and Plan  81 y.o. male  1. Acute on chronic diastolic heart failure with pulmonary hypertension:  Tremendous pitting edema extending up to his sacrum --- Would continue Lasix infusion  Will likely require more than 10 pounds of diuresis Suspect diuresis will take another week at least Closely follow renal function, electrolytes  2. Acute on chronic kidney disease:  Likely component of cardiorenal syndrome, creatinine down to 1.8 with diuresis Would continue Lasix infusion  3. Moderate to severe aortic stenosis:  Outpatient management Likely contributing to some degree to his acute on chronic diastolic CHF  4) cachexia Muscle wasting  The above discussed with the patient in detail  Total encounter time more than 25 minutes  Greater than 50% was spent in counseling and coordination of care with the patient   Signed, Esmond Plants, MD, Ph.D. [facility] 06/04/2016, 1:33 PM

## 2016-06-04 NOTE — Progress Notes (Signed)
Pt. Has cat napped throughout the night frequently asking for pain medication which was given each time with decreased results. Pt was walked around nurses station twice with gate belt and front wheel walker. Pt. Was joking with staff during the walk. Pt. Continue to sleep in chair.

## 2016-06-04 NOTE — Progress Notes (Signed)
[place] at [place] NAME: Nathan Kramer    MR#:  [id number]  DATE OF BIRTH:  07/18/1935  SUBJECTIVE:   Patient here due to shortness of breath and lower extremity edema noted to be in congestive heart failure.  Responding well to Lasix gtt now and make about 2 L of overnight yesterday.  No acute events overnight.   REVIEW OF SYSTEMS:    Review of Systems  Constitutional: Negative for chills and fever.  HENT: Negative for congestion and tinnitus.   Eyes: Negative for blurred vision and double vision.  Respiratory: Positive for shortness of breath. Negative for cough and wheezing.   Cardiovascular: Positive for leg swelling. Negative for chest pain, orthopnea and PND.  Gastrointestinal: Negative for abdominal pain, diarrhea, nausea and vomiting.  Genitourinary: Negative for dysuria and hematuria.  Neurological: Negative for dizziness, sensory change and focal weakness.  All other systems reviewed and are negative.   Nutrition: heart Healthy Tolerating Diet: Yes Tolerating PT: Await Eval.    DRUG ALLERGIES:   Allergies  Allergen Reactions  . Amoxicillin     Other reaction(s): Localized superficial swelling of skin Face swelling   . Reglan [Metoclopramide]     Involuntary movements body    VITALS:  Blood pressure (!) 97/55, pulse 88, temperature 97.8 F (36.6 C), temperature source Oral, resp. rate 16, height 5\' 8"  (1.727 m), weight 77.7 kg (171 lb 6.4 oz), SpO2 97 %.  PHYSICAL EXAMINATION:   Physical Exam  GENERAL:  81 y.o.-year-old patient sitting up in chair in no acute distress.  EYES: Legally blind. No scleral icterus. Extraocular muscles intact.  HEENT: Head atraumatic, normocephalic. Oropharynx and nasopharynx clear.  NECK:  Supple, no jugular venous distention. No thyroid enlargement, no tenderness.  LUNGS: Normal breath sounds bilaterally, no wheezing, bibasilar rales, no rhonchi. No use of accessory muscles of  respiration.  CARDIOVASCULAR: S1, S2 normal. II/VI SEM at LSB, No rubs, or gallops.  ABDOMEN: Soft, nontender, nondistended. Bowel sounds present. No organomegaly or mass.  EXTREMITIES: No cyanosis, clubbing, + 2 edema b/l.  Signs of chronic venous stasis b/l.  Legs wrapped in UNNA boots. NEUROLOGIC: Cranial nerves II through XII are intact. No focal Motor or sensory deficits b/l. Globally weak.   PSYCHIATRIC: The patient is alert and oriented x 3.  SKIN: No obvious rash, lesion, or ulcer.    LABORATORY PANEL:   CBC  Recent Labs Lab 06/03/16 0524  WBC 27.9*  HGB 13.2  HCT 43.0  PLT 271   ------------------------------------------------------------------------------------------------------------------  Chemistries   Recent Labs Lab 06/03/16 0524 06/04/16 0444  NA 137 139  K 4.5 3.7  CL 97* 98*  CO2 27 30  GLUCOSE 110* 101*  BUN 57* 63*  CREATININE 1.98* 1.83*  CALCIUM 8.8* 9.0  AST 42*  --   ALT 11*  --   ALKPHOS 120  --   BILITOT 1.0  --    ------------------------------------------------------------------------------------------------------------------  Cardiac Enzymes  Recent Labs Lab 06/01/16 1603  TROPONINI 0.14*   ------------------------------------------------------------------------------------------------------------------  RADIOLOGY:  No results found.   ASSESSMENT AND PLAN:   81 year old male with past medical history of hypertension, pulmonary hypertension, diastolic CHF, moderate aortic stenosis, history of GERD, prostate cancer who presented to the hospital due to shortness of breath and worsening lower extremity edema.  1. CHF- acute on chronic diastolic dysfunction with underlying pulmonary hypertension. - now on Lasix drip and seems to be responding to higher dose. Made about 2 L of  urine yesterday.  - appreciate Cards/Nephro input. Cont. Albumin infusions.  - follow I's and O's and daily weights.  2. Moderate Aortic Stenosis - as per  Cards not a great surgical candidate.  - Repeat Echo showing no new changes.  Follow up w/ Cards as outpatient. Cont. Supportive care for now.   3. CKD Stage IV - creatinine close to baseline but now on Lasix gtt.  - Nephrology consulted and appreciate input and cont. Current care.    4. Leukocytosis - ?? Etiology.  LE not likely cellulitis but chronic venous stasis. S/p UNNA boots.  - off abx now.  Follow WBC count in a.m.  -  CXR (-). UA (-).    5. Hypothyroidism-continue Synthroid.  6. LE edema - due to CHF, venous stasis.  - cont. Lasix. Seen by Wound team and placed UNNA boots.   All the records are reviewed and case discussed with Care Management/Social Worker. Management plans discussed with the patient, family and they are in agreement.  CODE STATUS: DNR  DVT Prophylaxis: Hep SQ  TOTAL TIME TAKING CARE OF THIS PATIENT: 25 minutes.   POSSIBLE D/C IN 2-3 DAYS, DEPENDING ON CLINICAL CONDITION.   Henreitta Leber M.D on 06/04/2016 at 12:16 PM  Between 7am to 6pm - Pager - [phone]  After 6pm go to [email or web address] - [employer] [facility]  Office  [phone]  CC: Primary care physician; Tracie Harrier, MD

## 2016-06-04 NOTE — Progress Notes (Signed)
[facility]  ROUNDING NOTE   Subjective:   UOP 2100  Creatinine 1.83 (1.98)  Furosemide 8mg  /hr gtt  Patient states he is feeling better.   Objective:  Vital signs in last 24 hours:  Temp:  [97.5 F (36.4 C)-97.9 F (36.6 C)] 97.7 F (36.5 C) (03/04 0800) Pulse Rate:  [73-87] 75 (03/04 0800) Resp:  [16-20] 16 (03/04 0800) BP: (91-107)/(42-60) 96/56 (03/04 0800) SpO2:  [94 %-98 %] 95 % (03/04 0800) Weight:  [77.7 kg (171 lb 6.4 oz)] 77.7 kg (171 lb 6.4 oz) (03/04 0321)  Weight change:  Filed Weights   05/31/16 2240 06/01/16 0415 06/04/16 0321  Weight: 77.1 kg (170 lb) 75.6 kg (166 lb 11.2 oz) 77.7 kg (171 lb 6.4 oz)    Intake/Output: I/O last 3 completed shifts: In: 50 [P.O.:720; I.V.:92; IV Piggyback:150] Out: 2950 [Urine:2950]   Intake/Output this shift:  No intake/output data recorded.  Physical Exam: General: NAD, sitting in chair  Head: Normocephalic, atraumatic. Moist oral mucosal membranes  Eyes: Anicteric, PERRL  Neck: Supple, trachea midline, +JVD  Lungs:  Bilateral wheezes  Heart: +murmur, regular rate  Abdomen:  Soft, nontender, abdominal wall edema ++  Extremities: UNNA Boots bilaterally, 2+ peripheral edema.  Neurologic: Nonfocal, moving all four extremities  Skin: No lesions  GU: Foley with yellow urine     Basic Metabolic Panel:  Recent Labs Lab 06/01/16 0020 06/01/16 0455 06/02/16 0942 06/03/16 0524 06/04/16 0444  NA 141 140 138 137 139  K 4.3 3.6 3.8 4.5 3.7  CL 103 100* 100* 97* 98*  CO2 29 32 28 27 30   GLUCOSE 113* 127* 128* 110* 101*  BUN 48* 47* 54* 57* 63*  CREATININE 2.31* 2.30* 2.15* 1.98* 1.83*  CALCIUM 8.6* 8.8* 8.7* 8.8* 9.0  PHOS  --   --   --   --  3.4    Liver Function Tests:  Recent Labs Lab 06/03/16 0524 06/04/16 0444  AST 42*  --   ALT 11*  --   ALKPHOS 120  --   BILITOT 1.0  --   PROT 6.1*  --   ALBUMIN 3.3* 3.6   No results for input(s): LIPASE, AMYLASE in the last 168 hours. No  results for input(s): AMMONIA in the last 168 hours.  CBC:  Recent Labs Lab 05/31/16 2251 06/01/16 0455 06/03/16 0524  WBC 28.0* 25.9* 27.9*  HGB 14.1 13.0 13.2  HCT 45.7 40.6 43.0  MCV 63.7* 63.6* 64.1*  PLT 281 258 271    Cardiac Enzymes:  Recent Labs Lab 06/01/16 0020 06/01/16 0455 06/01/16 1000 06/01/16 1603  TROPONINI 0.18* 0.17* 0.16* 0.14*    BNP: Invalid input(s): POCBNP  CBG: No results for input(s): GLUCAP in the last 168 hours.  Microbiology: Results for orders placed or performed during the hospital encounter of 05/31/16  Blood culture (routine x 2)     Status: None (Preliminary result)   Collection Time: 06/01/16 12:20 AM  Result Value Ref Range Status   Specimen Description BLOOD  L WRIST  Final   Special Requests BOTTLES DRAWN AEROBIC AND ANAEROBIC  ADEQUATE   Final   Culture NO GROWTH 2 DAYS  Final   Report Status PENDING  Incomplete  Blood culture (routine x 2)     Status: None (Preliminary result)   Collection Time: 06/01/16  1:11 AM  Result Value Ref Range Status   Specimen Description BLOOD  R FOREARM  Final   Special Requests BOTTLES DRAWN AEROBIC AND ANAEROBIC  ADEQUATE  Final   Culture NO GROWTH 2 DAYS  Final   Report Status PENDING  Incomplete    Coagulation Studies: No results for input(s): LABPROT, INR in the last 72 hours.  Urinalysis:  Recent Labs  06/02/16 1614  COLORURINE YELLOW*  LABSPEC 1.011  PHURINE 5.0  GLUCOSEU NEGATIVE  HGBUR MODERATE*  BILIRUBINUR NEGATIVE  KETONESUR NEGATIVE  PROTEINUR 30*  NITRITE NEGATIVE  LEUKOCYTESUR NEGATIVE      Imaging: No results found.   Medications:   . furosemide (LASIX) infusion 8 mg/hr (06/03/16 2306)   . albumin human  12.5 g Intravenous Q12H  . aspirin EC  81 mg Oral Daily  . feeding supplement (ENSURE ENLIVE)  237 mL Oral TID BM  . ferrous gluconate  324 mg Oral BID WC  . heparin  5,000 Units Subcutaneous Q8H  . levothyroxine  25 mcg Oral QAC breakfast  .  morphine  30 mg Oral TID WC  . potassium chloride  10 mEq Oral BID  . sodium chloride flush  3 mL Intravenous Q12H  . sodium chloride flush  3 mL Intravenous Q12H   sodium chloride, acetaminophen **OR** acetaminophen, HYDROcodone-acetaminophen, ondansetron **OR** ondansetron (ZOFRAN) IV, senna-docusate, sodium chloride flush  Assessment/ Plan:  [name] is a 81 y.o. white male with diastolic congestive heart failure, hypertension, CVA, BPH, , who was admitted to [facility] on 05/31/2016   1. Acute renal failure on chronic kidney disease stage III: with proteinuria, anasarca.  Chronic kidney disease secondary to hypertension. Acute renal failure from acute cardiorenal syndrome from diastolic congestive heart failure.  - furosemide gtt to 8mg /hr - IV albumin.   2. Hypertension and acute exacerbation of diastolic congestive heart failure/aortic stenosis and anasarca. Appreciate cardiology input.  - UNNA boots - low salt diet - fluid restriction - Furosemide gtt - low threshold to restart spironolactone.   3. Cellulitis: with leukocytosis - received vanco 3/1 to 3/2   LOS: [place], Loza Prell 3/4/20188:17 AM

## 2016-06-05 LAB — BASIC METABOLIC PANEL
Anion gap: 9 (ref 5–15)
BUN: 64 mg/dL — AB (ref 6–20)
CO2: 31 mmol/L (ref 22–32)
Calcium: 9.2 mg/dL (ref 8.9–10.3)
Chloride: 99 mmol/L — ABNORMAL LOW (ref 101–111)
Creatinine, Ser: 1.85 mg/dL — ABNORMAL HIGH (ref 0.61–1.24)
GFR calc Af Amer: 39 mL/min — ABNORMAL LOW (ref >60)
GFR, EST NON AFRICAN AMERICAN: 33 mL/min — AB (ref >60)
GLUCOSE: 119 mg/dL — AB (ref 65–99)
POTASSIUM: 3.6 mmol/L (ref 3.5–5.1)
Sodium: 139 mmol/L (ref 135–145)

## 2016-06-05 MED ORDER — SPIRONOLACTONE 25 MG PO TABS
25.0000 mg | ORAL_TABLET | Freq: Every day | ORAL | Status: DC
Start: 2016-06-05 — End: 2016-06-15
  Administered 2016-06-05 – 2016-06-14 (×10): 25 mg via ORAL
  Filled 2016-06-05 (×10): qty 1

## 2016-06-05 NOTE — Evaluation (Signed)
Physical Therapy Evaluation Patient Details Name: Nathan Kramer MRN: [id number] DOB: 12-19-1935 Today's Date: 06/05/2016   History of Present Illness  Nathan Kramer is a 81 y.o. male with a known history of congestive heart failure, chronic kidney disease, anemia, prostate cancer, hypertension, CVA presented to the emergency room with swelling in both the lower legs along with redness and weeping lesions in the lower extremities. Patient has serous sanguineous fluid weeping from the lower extremity skin.  He also complained of shortness of breath for the last couple of weeks. Has history of orthopnea. He was evaluated in the emergency room his BNP was elevated. He was given IV Lasix for diuresis and IV vancomycin and divided was started for lower extremity cellulitis. Troponin was elevated but patient also had elevation in creatinine. Elevated troponin could be secondary to demand ischemia and renal insufficiency. EKG normal sinus rhythm with no ST segment elevation. Patient was put on oxygen via nasal cannula in the emergency room. Hospitalist service was consulted for further care of the patient. Pt is now admitted for acute on chronic CHF with history of moderate/severe aortic stenosis. He is eager to work with physical therapy on this date.   Clinical Impression  Pt admitted with above diagnosis. Pt currently with functional limitations due to the deficits listed below (see PT Problem List). He is able to ambulate a full lap around RN station with therapist during evaluation. Attempted ambulation initially on room air but SaO2 drops to 87% and does not recover with standing rest breaks. Applied 2 L/min supplemental O2 and SaO2 remains at or above 90% throughout rest of ambulation. Pt denies DOE. He is able to comfortably carry on conversation with walking. He presents with some LE deconditioning which is worsened due to swelling. In addition he presents with higher level balance deficits in single leg  stance. He would benefit from rolling walker at discharge in order to decrease his risk for falls. He will also need HH PT for conditioning and balance. Pt in agreement. Pt will benefit from skilled PT services to address deficits in strength, balance, and mobility in order to return to full function at home.      Follow Up Recommendations Home health PT    Equipment Recommendations  Rolling walker with 5" wheels    Recommendations for Other Services       Precautions / Restrictions Precautions Precautions: Fall Restrictions Weight Bearing Restrictions: No      Mobility  Bed Mobility               General bed mobility comments: Received and left upright in recliner. Not assessed on this date  Transfers Overall transfer level: Needs assistance Equipment used: Rolling walker (2 wheeled) Transfers: Sit to/from Stand Sit to Stand: Supervision         General transfer comment: Pt demonstrates fair speed/sequencing with transfers. Safe hand placement demonstrated without cues. Fair stability when upright in standing  Ambulation/Gait Ambulation/Gait assistance: Min guard Ambulation Distance (Feet): 220 Feet Assistive device: Rolling walker (2 wheeled) Gait Pattern/deviations: Decreased step length - right;Decreased step length - left Gait velocity: Decreased but functional for full household mobility Gait velocity interpretation: <1.8 ft/sec, indicative of risk for recurrent falls General Gait Details: Pt able to ambulate a full lap around RN station with therapist. Attempted ambulation initially on room air but SaO2 drops to 87% and does not recover with standing rest breaks. Applied 2 L/min supplemental O2 and SaO2 remains at or above 90% throughout  rest of ambulation. Pt denies DOE. He is able to comfortably carry on conversation with walking. He has a history of R eye blindness and requires infrequent redirection for safety  Stairs            Wheelchair  Mobility    Modified Rankin (Stroke Patients Only)       Balance Overall balance assessment: Needs assistance Sitting-balance support: No upper extremity supported Sitting balance-Leahy Scale: Good     Standing balance support: No upper extremity supported Standing balance-Leahy Scale: Fair Standing balance comment: Able to maintain feet apart and together balance without UE support. Negative Rhomberg. Single leg balance approximately 2 seconds on each LE                             Pertinent Vitals/Pain Pain Assessment: 0-10 Pain Score: 5  Pain Location: R knee Pain Intervention(s): Monitored during session    Home Living Family/patient expects to be discharged to:: Private residence Living Arrangements: Spouse/significant other;Children;Other (Comment) (Grandchildren) Available Help at Discharge: Available 24 hours/day Type of Home: House Home Access: Stairs to enter Entrance Stairs-Rails: Can reach both;Right;Left Entrance Stairs-Number of Steps: 3 Home Layout: One level Home Equipment: Cane - single point;Other (comment) (no walker, no BSC, no home O2)      Prior Function Level of Independence: Needs assistance   Gait / Transfers Assistance Needed: Ambulates with "walking stick," No falls in the last 12 months.   ADL's / Homemaking Assistance Needed: Independent with ADLs, assist with IADLs. Doesn't drive        Hand Dominance   Dominant Hand: Right    Extremity/Trunk Assessment   Upper Extremity Assessment Upper Extremity Assessment: Overall WFL for tasks assessed    Lower Extremity Assessment Lower Extremity Assessment: Generalized weakness       Communication   Communication: No difficulties  Cognition Arousal/Alertness: Awake/alert Behavior During Therapy: WFL for tasks assessed/performed Overall Cognitive Status: Within Functional Limits for tasks assessed                      General Comments      Exercises      Assessment/Plan    PT Assessment Patient needs continued PT services  PT Problem List Decreased strength;Decreased activity tolerance;Decreased balance;Decreased mobility;Cardiopulmonary status limiting activity       PT Treatment Interventions DME instruction;Gait training;Functional mobility training;Therapeutic activities;Therapeutic exercise;Balance training;Neuromuscular re-education;Patient/family education    PT Goals (Current goals can be found in the Care Plan section)  Acute Rehab PT Goals Patient Stated Goal: Return to prior level of function at home. "I want to live al ong time." PT Goal Formulation: With patient Time For Goal Achievement: 06/19/16 Potential to Achieve Goals: Good    Frequency Min 2X/week   Barriers to discharge        Co-evaluation               End of Session Equipment Utilized During Treatment: Gait belt Activity Tolerance: Patient tolerated treatment well Patient left: in chair;with call bell/phone within reach;with chair alarm set;Other (comment) (Bilateral LEs elevated on pillows) Nurse Communication: Mobility status PT Visit Diagnosis: Unsteadiness on feet (R26.81);Muscle weakness (generalized) (M62.81)         Time: [id number] PT Time Calculation (min) (ACUTE ONLY): 33 min   Charges:   PT Evaluation $PT Eval Low Complexity: 1 Procedure PT Treatments $Gait Training: 8-22 mins   PT G Codes:  Lyndel Safe Issak Goley PT, DPT   Sarea Fyfe 06/05/2016, 4:48 PM

## 2016-06-05 NOTE — Progress Notes (Signed)
Pt. Catnapped throughout the night. Wife at bedside. Pt. Requested pain medication frequently and would drift off to sleep soon after taking medication. Pain medication had decrease affects but no complete relief per pt.

## 2016-06-05 NOTE — Progress Notes (Signed)
Progress Note  Patient Name: Nathan Kramer Date of Encounter: 06/05/2016  Primary Cardiologist: A. Ingal, MD   Subjective   Breathing relatively stable @ rest.  He is concerned that he is still so edematous - realizes he should have sought treatment sooner.  No chest pain or palps.  Inpatient Medications    Scheduled Meds: . aspirin EC  81 mg Oral Daily  . feeding supplement (ENSURE ENLIVE)  237 mL Oral TID BM  . ferrous gluconate  324 mg Oral BID WC  . heparin  5,000 Units Subcutaneous Q8H  . levothyroxine  25 mcg Oral QAC breakfast  . morphine  30 mg Oral TID WC  . potassium chloride  10 mEq Oral BID  . sodium chloride flush  3 mL Intravenous Q12H  . sodium chloride flush  3 mL Intravenous Q12H   Continuous Infusions: . furosemide (LASIX) infusion 8 mg/hr (06/05/16 0244)   PRN Meds: sodium chloride, acetaminophen **OR** acetaminophen, HYDROcodone-acetaminophen, ondansetron **OR** ondansetron (ZOFRAN) IV, senna-docusate, sodium chloride flush   Vital Signs    Vitals:   06/04/16 1110 06/04/16 2011 06/05/16 0444 06/05/16 0800  BP: (!) 97/55 (!) 97/45 (!) 91/45 (!) 97/55  Pulse: 88 76 76 87  Resp: 16 18 20 20   Temp: 97.8 F (36.6 C) 98 F (36.7 C) 98.3 F (36.8 C) 98.3 F (36.8 C)  TempSrc: Oral   Oral  SpO2: 97% 100% 99% 99%  Weight:      Height:        Intake/Output Summary (Last 24 hours) at 06/05/16 1122 Last data filed at 06/05/16 0648  Gross per 24 hour  Intake              212 ml  Output              900 ml  Net             -688 ml   Filed Weights   05/31/16 2240 06/01/16 0415 06/04/16 0321  Weight: 170 lb (77.1 kg) 166 lb 11.2 oz (75.6 kg) 171 lb 6.4 oz (77.7 kg)    Physical Exam   GEN: Frail, grossly edematous. HEENT: Grossly normal.  Neck: Supple, JVP to jaw, no carotid bruits, or masses. Cardiac: RRR, 3/6 SEM heard throughout, loudest @ RUSB, soft diast murmur @ USB, no rubs, or gallops. No clubbing, cyanosis.  Anasarca -  abd/flanks/thighs/scrotum/lower legs (wrapped).  Radials/DP/PT 2+ and equal bilaterally.  Respiratory:  Respirations regular and unlabored, diminished breath sounds bilat. GI: Firm, edematous, BS + x 4. MS: no deformity or atrophy. Skin: warm and dry, no rash. Neuro:  Strength and sensation are intact. Psych: AAOx3.  Normal affect.  Labs    Chemistry Recent Labs Lab 06/03/16 0524 06/04/16 0444 06/05/16 0459  NA 137 139 139  K 4.5 3.7 3.6  CL 97* 98* 99*  CO2 27 30 31   GLUCOSE 110* 101* 119*  BUN 57* 63* 64*  CREATININE 1.98* 1.83* 1.85*  CALCIUM 8.8* 9.0 9.2  PROT 6.1*  --   --   ALBUMIN 3.3* 3.6  --   AST 42*  --   --   ALT 11*  --   --   ALKPHOS 120  --   --   BILITOT 1.0  --   --   GFRNONAA 31* 34* 33*  GFRAA 35* 39* 39*  ANIONGAP 13 11 9      Hematology Recent Labs Lab 05/31/16 2251 06/01/16 0455 06/03/16 0524  WBC 28.0*  25.9* 27.9*  RBC 7.17* 6.39* 6.71*  HGB 14.1 13.0 13.2  HCT 45.7 40.6 43.0  MCV 63.7* 63.6* 64.1*  MCH 19.7* 20.4* 19.6*  MCHC 31.0* 32.0 30.6*  RDW 21.6* 21.5* 21.7*  PLT 281 258 271    Cardiac Enzymes Recent Labs Lab 06/01/16 0020 06/01/16 0455 06/01/16 1000 06/01/16 1603  TROPONINI 0.18* 0.17* 0.16* 0.14*     BNP Recent Labs Lab 05/31/16 2251  BNP 1,557.0*    Radiology    No results found.  Telemetry    RSR - Personally Reviewed  Cardiac Studies   2D Echocardiogram 3.1.2018  Study Conclusions  - Left ventricle: The cavity size was normal. There was mild   concentric hypertrophy. Systolic function was normal. The   estimated ejection fraction was in the range of 50% to 55%. Wall   motion was normal; there were no regional wall motion   abnormalities. Features are consistent with a pseudonormal left   ventricular filling pattern, with concomitant abnormal relaxation   and increased filling pressure (grade 2 diastolic dysfunction). - Ventricular septum: The contour showed systolic flattening. These    changes are consistent with RV pressure overload. - Aortic valve: There was moderate to severe stenosis. There was   moderate regurgitation. Mean gradient (S): 22 mm Hg. Valve area   (VTI): 0.91 cm^2. - Mitral valve: Calcified annulus. Mildly thickened, mildly   calcified leaflets . There was mild regurgitation. - Left atrium: The atrium was mildly dilated. - Right atrium: The atrium was mildly dilated. - Pulmonary arteries: Systolic pressure was moderately increased.   PA peak pressure: 55 mm Hg (S).  Patient Profile     81 y.o. male w/ a h/o diastolic CHF, mod to sev AS, mod AI, prior stroke, and HTN, who was admitted 3/1 with progressive dyspnea, wt gain, and CHF.  Assessment & Plan    1.  Acute on chronic diastolic CHF:  Admitted 3/1 with progressive dyspnea, edema, and wt gain.  Has responded well to Iv lasix.  Minus 328 ml overnight.  Minus 3.6L since admission.  Wt recorded as up 5 lbs on 3/4.  No weight recorded this am. Still with marked volume overload/anasarca. HR/BP stable.  BUN and CO2 have slowly risen since admission.  Creat relatively stable.  Cont lasix gtt.  Albumin low nl.  Weigh.  Dry wt looks to be ~ 146 lbs (03/02/2016).  Nephrology managing diuretics.  2.  Acute on chronic stage III kidney dzs:  Creat relatively stable. Nephrology seeing.  3.  Moderate to severe Aortic Stenosis:  Likely contributing to CHF.  Outpt mgmt - consider TAVR clinic though CKD would likely prevent contrast imaging, thus making him a poor candidate.  4. Elevated troponin:  In setting of #1/2.  No chest pain.  Low risk MV in 10/2015.  Echo w/ nl LVEF.  No further ischemic eval warranted.  Signed, Murray Hodgkins, NP  06/05/2016, 11:22 AM

## 2016-06-05 NOTE — Progress Notes (Addendum)
[facility] NAME: Nathan Kramer    MR#:  [id number]  DATE OF BIRTH:  [birth year]  SUBJECTIVE:   Patient has responded well to IV Lasix   REVIEW OF SYSTEMS:    Review of Systems  Constitutional: Negative for chills and fever.  HENT: Negative for congestion and tinnitus.   Eyes: Negative for blurred vision and double vision.  Respiratory: Positive for shortness of breath. Negative for cough and wheezing.   Cardiovascular: Positive for leg swelling. Negative for chest pain, orthopnea and PND.  Gastrointestinal: Negative for abdominal pain, diarrhea, nausea and vomiting.  Genitourinary: Negative for dysuria and hematuria.  Neurological: Negative for dizziness, sensory change and focal weakness.  All other systems reviewed and are negative.   Nutrition: heart Healthy Tolerating Diet: Yes Tolerating PT: Await Eval.    DRUG ALLERGIES:   Allergies  Allergen Reactions  . Amoxicillin     Other reaction(s): Localized superficial swelling of skin Face swelling   . Reglan [Metoclopramide]     Involuntary movements body    VITALS:  Blood pressure (!) 97/55, pulse 87, temperature 98.3 F (36.8 C), temperature source Oral, resp. rate 20, height 5\' 8"  (1.727 m), weight 77.7 kg (171 lb 6.4 oz), SpO2 99 %.  PHYSICAL EXAMINATION:   Physical Exam  GENERAL:  81 y.o.-year-old patient sitting up in chair in no acute distress.  EYES: Legally blind. No scleral icterus. Extraocular muscles intact.  HEENT: Head atraumatic, normocephalic. Oropharynx and nasopharynx clear.  NECK:  Supple, no jugular venous distention. No thyroid enlargement, no tenderness.  LUNGS: Normal breath sounds bilaterally, no wheezing, bibasilar rales, no rhonchi. No use of accessory muscles of respiration.  CARDIOVASCULAR: S1, S2 normal.3/6 SEM at LSB, No rubs, or gallops.  ABDOMEN: Soft, nontender, +Distended. Bowel sounds present. No organomegaly or mass.   EXTREMITIES: No cyanosis, clubbing, + 2 edema b/l.  Signs of chronic venous stasis b/l.  Legs wrapped in UNNA boots. Significant scrotal swelling NEUROLOGIC: Cranial nerves II through XII are intact. No focal Motor or sensory deficits b/l.  PSYCHIATRIC: The patient is alert and oriented x 3.  SKIN: No obvious rash, lesion, or ulcer.    LABORATORY PANEL:   CBC  Recent Labs Lab 06/03/16 0524  WBC 27.9*  HGB 13.2  HCT 43.0  PLT 271   ------------------------------------------------------------------------------------------------------------------  Chemistries   Recent Labs Lab 06/03/16 0524  06/05/16 0459  NA 137  < > 139  K 4.5  < > 3.6  CL 97*  < > 99*  CO2 27  < > 31  GLUCOSE 110*  < > 119*  BUN 57*  < > 64*  CREATININE 1.98*  < > 1.85*  CALCIUM 8.8*  < > 9.2  AST 42*  --   --   ALT 11*  --   --   ALKPHOS 120  --   --   BILITOT 1.0  --   --   < > = values in this interval not displayed. ------------------------------------------------------------------------------------------------------------------  Cardiac Enzymes  Recent Labs Lab 06/01/16 1603  TROPONINI 0.14*   ------------------------------------------------------------------------------------------------------------------  RADIOLOGY:  No results found.   ASSESSMENT AND PLAN:   81 year old male with past medical history of hypertension, pulmonary hypertension, diastolic CHF, moderate aortic stenosis, history of GERD, prostate cancer who presented to the hospital due to shortness of breath and worsening lower extremity edema.  1.Acute on chronic diastolic dysfunction with underlying pulmonary hypertension. Continue Lasix drip .  Appreciate Cards/Nephro  input.  Follow I's and O's and daily weights.  2. Moderate/Severe  Aortic Stenosis contributing to CHF: As per Cardiology, he is not a great surgical candidate.   Repeat Echo shows no new changes.   He will follow up with Cardiology as outpatient.    3. CKD Stage III: Creatinine is responded to Lasix drip.  Nephrology consulted and appreciate input and cont. Current care.    4. Leukocytosis - ?? Etiology.  LE not likely cellulitis but chronic venous stasis. S/p UNNA boots.    Follow WBC count in a.m.   Chest x-ray and urine analysis is negative for acute infection  5. Hypothyroidism: Continue Synthroid.  6. LE edema due to CHF and venous stasis.  Unna boots and Lasix 7. Elevated troponin due to demand ischemia and not ACS Management plans discussed with the patient and he is in agreement CODE STATUS: DNR  DVT Prophylaxis: Hep SQ  TOTAL TIME TAKING CARE OF THIS PATIENT: 25 minutes.   POSSIBLE D/C IN 2 DAYS, DEPENDING ON CLINICAL CONDITION.   Nathan Kramer M.D on 06/05/2016 at 11:24 AM  Between 7am to 6pm - Pager - [phone]  After 6pm go to [email or web address] - [employer] [facility]  Office  [phone]  CC: Primary care physician; Tracie Harrier, MD

## 2016-06-05 NOTE — Progress Notes (Signed)
Pt. Walked around nurses station x1 with no difficulty or distress noted.

## 2016-06-05 NOTE — Care Management Important Message (Signed)
Important Message  Patient Details  Name: Nathan Kramer MRN: [id number] Date of Birth: 15-Apr-1935   Medicare Important Message Given:  Yes    Beverly Sessions, RN 06/05/2016, 2:51 PM

## 2016-06-05 NOTE — Progress Notes (Signed)
[facility]  ROUNDING NOTE   Subjective:   Patient is continued on Lasix drip at 8 mg per hour.  He has good response.  His serum creatinine stable at 1.85.  Serum potassium is 2.6 today.  Patient states he is able to eat some of her appetite remains poor overall.  He continues to have some lower extremity edema.  Objective:  Vital signs in last 24 hours:  Temp:  [98 F (36.7 C)-98.3 F (36.8 C)] 98.2 F (36.8 C) (03/05 1230) Pulse Rate:  [76-87] 81 (03/05 1230) Resp:  [16-20] 16 (03/05 1230) BP: (91-101)/(45-55) 101/46 (03/05 1230) SpO2:  [97 %-100 %] 97 % (03/05 1230)  Weight change:  Filed Weights   05/31/16 2240 06/01/16 0415 06/04/16 0321  Weight: 77.1 kg (170 lb) 75.6 kg (166 lb 11.2 oz) 77.7 kg (171 lb 6.4 oz)    Intake/Output: I/O last 3 completed shifts: In: [id number] [P.O.:480; I.V.:184] Out: 2450 [Urine:2450]   Intake/Output this shift:  Total I/O In: 160 [P.O.:160] Out: 1300 [Urine:1300]  Physical Exam: General: NAD, sitting in chair  Head: Normocephalic, atraumatic. Moist oral mucosal membranes  Eyes: Anicteric,  Neck: Supple, trachea midline, +JVD  Lungs:  Bilateral wheezes  Heart: +murmur, irregular rhythm  Abdomen:  Soft, nontender, abdominal wall edema ++  Extremities: UNNA Boots bilaterally, 2+ peripheral edema.  Neurologic: Nonfocal, moving all four extremities  Skin: No lesions  GU: Foley with yellow urine     Basic Metabolic Panel:  Recent Labs Lab 06/01/16 0455 06/02/16 0942 06/03/16 0524 06/04/16 0444 06/05/16 0459  NA 140 138 137 139 139  K 3.6 3.8 4.5 3.7 3.6  CL 100* 100* 97* 98* 99*  CO2 32 28 27 30 31   GLUCOSE 127* 128* 110* 101* 119*  BUN 47* 54* 57* 63* 64*  CREATININE 2.30* 2.15* 1.98* 1.83* 1.85*  CALCIUM 8.8* 8.7* 8.8* 9.0 9.2  PHOS  --   --   --  3.4  --     Liver Function Tests:  Recent Labs Lab 06/03/16 0524 06/04/16 0444  AST 42*  --   ALT 11*  --   ALKPHOS 120  --   BILITOT 1.0  --   PROT 6.1*   --   ALBUMIN 3.3* 3.6   No results for input(s): LIPASE, AMYLASE in the last 168 hours. No results for input(s): AMMONIA in the last 168 hours.  CBC:  Recent Labs Lab 05/31/16 2251 06/01/16 0455 06/03/16 0524  WBC 28.0* 25.9* 27.9*  HGB 14.1 13.0 13.2  HCT 45.7 40.6 43.0  MCV 63.7* 63.6* 64.1*  PLT 281 258 271    Cardiac Enzymes:  Recent Labs Lab 06/01/16 0020 06/01/16 0455 06/01/16 1000 06/01/16 1603  TROPONINI 0.18* 0.17* 0.16* 0.14*    BNP: Invalid input(s): POCBNP  CBG: No results for input(s): GLUCAP in the last 168 hours.  Microbiology: Results for orders placed or performed during the hospital encounter of 05/31/16  Blood culture (routine x 2)     Status: None (Preliminary result)   Collection Time: 06/01/16 12:20 AM  Result Value Ref Range Status   Specimen Description BLOOD  L WRIST  Final   Special Requests BOTTLES DRAWN AEROBIC AND ANAEROBIC  ADEQUATE   Final   Culture NO GROWTH 4 DAYS  Final   Report Status PENDING  Incomplete  Blood culture (routine x 2)     Status: None (Preliminary result)   Collection Time: 06/01/16  1:11 AM  Result Value Ref Range Status  Specimen Description BLOOD  R FOREARM  Final   Special Requests BOTTLES DRAWN AEROBIC AND ANAEROBIC  ADEQUATE  Final   Culture NO GROWTH 4 DAYS  Final   Report Status PENDING  Incomplete    Coagulation Studies: No results for input(s): LABPROT, INR in the last 72 hours.  Urinalysis: No results for input(s): COLORURINE, LABSPEC, PHURINE, GLUCOSEU, HGBUR, BILIRUBINUR, KETONESUR, PROTEINUR, UROBILINOGEN, NITRITE, LEUKOCYTESUR in the last 72 hours.  Invalid input(s): APPERANCEUR    Imaging: No results found.   Medications:   . furosemide (LASIX) infusion 8 mg/hr (06/05/16 0244)   . aspirin EC  81 mg Oral Daily  . feeding supplement (ENSURE ENLIVE)  237 mL Oral TID BM  . ferrous gluconate  324 mg Oral BID WC  . heparin  5,000 Units Subcutaneous Q8H  . levothyroxine  25 mcg Oral  QAC breakfast  . morphine  30 mg Oral TID WC  . potassium chloride  10 mEq Oral BID  . sodium chloride flush  3 mL Intravenous Q12H  . sodium chloride flush  3 mL Intravenous Q12H   sodium chloride, acetaminophen **OR** acetaminophen, HYDROcodone-acetaminophen, ondansetron **OR** ondansetron (ZOFRAN) IV, senna-docusate, sodium chloride flush  Assessment/ Plan:  Mr. OVEL GUBA is a 81 y.o. white male with diastolic congestive heart failure, hypertension, CVA, BPH, , who was admitted to [facility] on 05/31/2016   1. Acute renal failure on chronic kidney disease stage III: with proteinuria, anasarca.  Chronic kidney disease secondary to hypertension. Acute renal failure from acute cardiorenal syndrome from diastolic congestive heart failure.  - furosemide gtt continued at 8mg /hr - IV albumin given for 2 days  2. aortic stenosis and anasarca.   Louretta Parma boots - low salt diet - fluid restriction - Furosemide gtt - start spironolactone.   3. Cellulitis: with leukocytosis - received vanco 3/1 to 3/2   LOS: 4 Azul Coffie 3/5/20186:45 PM

## 2016-06-06 ENCOUNTER — Ambulatory Visit: Payer: Medicare Other | Admitting: Anesthesiology

## 2016-06-06 LAB — CULTURE, BLOOD (ROUTINE X 2)
CULTURE: NO GROWTH
Culture: NO GROWTH
SPECIAL REQUESTS: ADEQUATE
SPECIAL REQUESTS: ADEQUATE

## 2016-06-06 LAB — CBC
HCT: 44.3 % (ref 40.0–52.0)
HEMOGLOBIN: 13 g/dL (ref 13.0–18.0)
MCH: 19.2 pg — AB (ref 26.0–34.0)
MCHC: 29.3 g/dL — ABNORMAL LOW (ref 32.0–36.0)
MCV: 65.4 fL — AB (ref 80.0–100.0)
PLATELETS: 330 10*3/uL (ref 150–440)
RBC: 6.77 MIL/uL — AB (ref 4.40–5.90)
RDW: 22.5 % — ABNORMAL HIGH (ref 11.5–14.5)
WBC: 24.2 10*3/uL — ABNORMAL HIGH (ref 3.8–10.6)

## 2016-06-06 LAB — BASIC METABOLIC PANEL
Anion gap: 11 (ref 5–15)
BUN: 64 mg/dL — AB (ref 6–20)
CHLORIDE: 97 mmol/L — AB (ref 101–111)
CO2: 34 mmol/L — AB (ref 22–32)
Calcium: 9.4 mg/dL (ref 8.9–10.3)
Creatinine, Ser: 1.7 mg/dL — ABNORMAL HIGH (ref 0.61–1.24)
GFR calc Af Amer: 43 mL/min — ABNORMAL LOW (ref >60)
GFR calc non Af Amer: 37 mL/min — ABNORMAL LOW (ref >60)
GLUCOSE: 151 mg/dL — AB (ref 65–99)
POTASSIUM: 3.7 mmol/L (ref 3.5–5.1)
Sodium: 142 mmol/L (ref 135–145)

## 2016-06-06 MED ORDER — SALINE SPRAY 0.65 % NA SOLN
1.0000 | NASAL | Status: DC | PRN
  Filled 2016-06-06: qty 44

## 2016-06-06 MED ORDER — BISACODYL 10 MG RE SUPP
10.0000 mg | Freq: Every day | RECTAL | Status: DC | PRN
  Administered 2016-06-06: 10 mg via RECTAL
  Filled 2016-06-06: qty 1

## 2016-06-06 MED ORDER — FLEET ENEMA 7-19 GM/118ML RE ENEM
1.0000 | ENEMA | Freq: Once | RECTAL | Status: AC
  Administered 2016-06-06: 1 via RECTAL

## 2016-06-06 NOTE — Progress Notes (Signed)
[facility]  ROUNDING NOTE   Subjective:   Patient is continued on Lasix drip at 8 mg per hour.  He has good response.  His serum creatinine improved slightly to 1.70  He continues to have some lower extremity edema. UOP 2400 cc Blood tinged urine  Objective:  Vital signs in last 24 hours:  Temp:  [97.4 F (36.3 C)-98.1 F (36.7 C)] 97.8 F (36.6 C) (03/06 1112) Pulse Rate:  [76-89] 80 (03/06 1112) Resp:  [18-20] 18 (03/06 1112) BP: (100-115)/(38-52) 107/38 (03/06 1112) SpO2:  [96 %-100 %] 100 % (03/06 1112) Weight:  [77 kg (169 lb 12.8 oz)] 77 kg (169 lb 12.8 oz) (03/06 0403)  Weight change:  Filed Weights   06/01/16 0415 06/04/16 0321 06/06/16 0403  Weight: 75.6 kg (166 lb 11.2 oz) 77.7 kg (171 lb 6.4 oz) 77 kg (169 lb 12.8 oz)    Intake/Output: I/O last 3 completed shifts: In: 613.6 [P.O.:400; I.V.:213.6] Out: 2400 [Urine:2400]   Intake/Output this shift:  Total I/O In: 120 [P.O.:120] Out: 800 [Urine:800]  Physical Exam: General: NAD, sitting in chair  Head: Normocephalic, atraumatic. Moist oral mucosal membranes  Eyes: Anicteric,  Neck: Supple, trachea midline, +JVD  Lungs:  Bilateral wheezes  Heart: +murmur, irregular rhythm  Abdomen:  Soft, nontender, abdominal wall edema ++  Extremities: UNNA Boots bilaterally, 2+ peripheral edema.  Neurologic: Nonfocal, moving all four extremities  Skin: No lesions  GU: Foley with blood tinged urine     Basic Metabolic Panel:  Recent Labs Lab 06/02/16 0942 06/03/16 0524 06/04/16 0444 06/05/16 0459 06/06/16 0516  NA 138 137 139 139 142  K 3.8 4.5 3.7 3.6 3.7  CL 100* 97* 98* 99* 97*  CO2 28 27 30 31  34*  GLUCOSE 128* 110* 101* 119* 151*  BUN 54* 57* 63* 64* 64*  CREATININE 2.15* 1.98* 1.83* 1.85* 1.70*  CALCIUM 8.7* 8.8* 9.0 9.2 9.4  PHOS  --   --  3.4  --   --     Liver Function Tests:  Recent Labs Lab 06/03/16 0524 06/04/16 0444  AST 42*  --   ALT 11*  --   ALKPHOS 120  --    BILITOT 1.0  --   PROT 6.1*  --   ALBUMIN 3.3* 3.6   No results for input(s): LIPASE, AMYLASE in the last 168 hours. No results for input(s): AMMONIA in the last 168 hours.  CBC:  Recent Labs Lab 05/31/16 2251 06/01/16 0455 06/03/16 0524 06/06/16 0516  WBC 28.0* 25.9* 27.9* 24.2*  HGB 14.1 13.0 13.2 13.0  HCT 45.7 40.6 43.0 44.3  MCV 63.7* 63.6* 64.1* 65.4*  PLT 281 258 271 330    Cardiac Enzymes:  Recent Labs Lab 06/01/16 0020 06/01/16 0455 06/01/16 1000 06/01/16 1603  TROPONINI 0.18* 0.17* 0.16* 0.14*    BNP: Invalid input(s): POCBNP  CBG: No results for input(s): GLUCAP in the last 168 hours.  Microbiology: Results for orders placed or performed during the hospital encounter of 05/31/16  Blood culture (routine x 2)     Status: None   Collection Time: 06/01/16 12:20 AM  Result Value Ref Range Status   Specimen Description BLOOD  L WRIST  Final   Special Requests BOTTLES DRAWN AEROBIC AND ANAEROBIC  ADEQUATE   Final   Culture NO GROWTH 5 DAYS  Final   Report Status 06/06/2016 FINAL  Final  Blood culture (routine x 2)     Status: None   Collection Time: 06/01/16  1:11 AM  Result Value Ref Range Status   Specimen Description BLOOD  R FOREARM  Final   Special Requests BOTTLES DRAWN AEROBIC AND ANAEROBIC  ADEQUATE  Final   Culture NO GROWTH 5 DAYS  Final   Report Status 06/06/2016 FINAL  Final    Coagulation Studies: No results for input(s): LABPROT, INR in the last 72 hours.  Urinalysis: No results for input(s): COLORURINE, LABSPEC, PHURINE, GLUCOSEU, HGBUR, BILIRUBINUR, KETONESUR, PROTEINUR, UROBILINOGEN, NITRITE, LEUKOCYTESUR in the last 72 hours.  Invalid input(s): APPERANCEUR    Imaging: No results found.   Medications:   . furosemide (LASIX) infusion 8 mg/hr (06/06/16 1018)   . aspirin EC  81 mg Oral Daily  . feeding supplement (ENSURE ENLIVE)  237 mL Oral TID BM  . ferrous gluconate  324 mg Oral BID WC  . heparin  5,000 Units  Subcutaneous Q8H  . levothyroxine  25 mcg Oral QAC breakfast  . morphine  30 mg Oral TID WC  . potassium chloride  10 mEq Oral BID  . sodium chloride flush  3 mL Intravenous Q12H  . sodium chloride flush  3 mL Intravenous Q12H  . spironolactone  25 mg Oral Daily   sodium chloride, acetaminophen **OR** acetaminophen, bisacodyl, HYDROcodone-acetaminophen, ondansetron **OR** ondansetron (ZOFRAN) IV, senna-docusate, sodium chloride, sodium chloride flush  Assessment/ Plan:  [name] is a 81 y.o. white male with diastolic congestive heart failure, hypertension, CVA, BPH, , who was admitted to [facility] on 05/31/2016   1. Acute renal failure on chronic kidney disease stage III: with proteinuria, anasarca.  Chronic kidney disease secondary to hypertension. Acute renal failure from acute cardiorenal syndrome from diastolic congestive heart failure.  - furosemide gtt continued at 8mg /hr - IV albumin given for 2 days  2.  Anasarca; Cardiac - aortic stenosis - UNNA boots - low salt diet - fluid restriction - Furosemide gtt - continue spironolactone.   3. Cellulitis: with leukocytosis - received vanco 3/1 to 3/2   LOS: 5 Nathan Kramer 3/6/20184:26 PM

## 2016-06-06 NOTE — Consult Note (Signed)
[facility] Nurse wound follow up Reason for Consult: Bilateral venous stasis ulcers and CHF exacerbation.  Wears removable compression at home.  Wife reinforces that with self adherent Coban compression as well.  We have had a trial of Unnas boots x 1 week.  Legs are greatly improved today.   Wound type:Chronic venous stasis with blistering and weeping that has resolved.  Pressure Injury POA: Yes Measurement:left anterior lower leg  2.5 cm x 2 cm x 0.1 cm ruptured blister Scattered nonintact blistering to right leg, anterior and posterior.  absorptive alginate dressing for moisture management.  Legs improved  Wound [id number] and moist Drainage (amount, consistency, odor) minimal serous weeping  No odor Periwound:erythema and edema noted, but improved  Dressing procedure/placement/frequency:Cleanse bilateral lower legs with soap and water and pat dry.  Apply alginate dressing to blistering and weeping.  Wrap with zinc layer from below toes to below knee.  Secure with self adherent COban.   Wife instructed to resume removable compression when discharged.  Instructed not to shower with Unnas boots on and remove in one week.  Recommend HH after discharge for Unnas boots management.  Will not follow at this time.  Please re-consult if needed.  Domenic Moras RN BSN [place] Pager [phone]

## 2016-06-06 NOTE — Progress Notes (Signed)
Nutrition Follow-up  DOCUMENTATION CODES:   Severe malnutrition in context of chronic illness  INTERVENTION:  - Continue with Ensure Enlive supplementation TID, each supplement provides 350 calories, 20 gram protein   NUTRITION DIAGNOSIS:   Malnutrition related to chronic illness as evidenced by severe fluid accumulation, severe depletion of muscle mass, severe depletion of body fat.  Ongoing  GOAL:   Patient will meet greater than or equal to 90% of their needs  Progressing  MONITOR:   PO intake, I & O's, Labs, Weight trends, Supplement acceptance  REASON FOR ASSESSMENT:   Malnutrition Screening Tool    ASSESSMENT:    Nathan Kramer  is a 81 y.o. male with a known history of Congestive heart failure, chronic kidney disease, anemia, prostate cancer, hypertension, CVA presented to the emergency room with swelling in both the lower legs along with redness and weeping lesions in the lower extremities  Pt not willing to discuss nutrition at this time and repeatedly requested pain medication.  When asked about meal completion, pt stated he did not know.   Per chart review of meal completion percentages, pt has consumed negligible amounts to 100%   Pt has been accepting his Ensures daily.   RN classified his generalized edema as deep pitting. Pt is currently on Lasix drip to aid in urine output.  Labs reviewed Medications reviewed; Potassium chloride, Fergon, IV Lasix, Aldactone  Diet Order:  Diet Heart Room service appropriate? Yes; Fluid consistency: Thin; Fluid restriction: 1200 mL Fluid  Skin:  Reviewed, no issues  Last BM:  3/4  Height:   Ht Readings from Last 1 Encounters:  06/01/16 5\' 8"  (1.727 m)    Weight:   Wt Readings from Last 1 Encounters:  06/06/16 169 lb 12.8 oz (77 kg)    Ideal Body Weight:  70 kg  BMI:  Body mass index is 25.82 kg/m.  Estimated Nutritional Needs:   Kcal:  1500-1800 calories  Protein:  75-91 gm  Fluid:  >/=  1.5L  EDUCATION NEEDS:   No education needs identified at this time  [employer] Dietetic Intern

## 2016-06-06 NOTE — Progress Notes (Signed)
Patient c/o constipation , MD made aware and fleet enema, dulcolax suppository  and soap subs enema gi/ven with very little effect , slight rectal bleeding noted ,MD made aware, will continue to monitor

## 2016-06-06 NOTE — Progress Notes (Addendum)
[facility] NAME: Nathan Kramer    MR#:  [id number]  DATE OF BIRTH:  [birth year]  SUBJECTIVE:   Patient having some nosebleeds from nasal cannula  Shortness of breath and lobe should be edema has improved  REVIEW OF SYSTEMS:    Review of Systems  Constitutional: Negative for chills and fever.  HENT: Positive for nosebleeds. Negative for congestion and tinnitus.   Eyes: Negative for blurred vision and double vision.  Respiratory: Positive for shortness of breath (better). Negative for cough and wheezing.   Cardiovascular: Positive for leg swelling. Negative for chest pain, orthopnea and PND.  Gastrointestinal: Negative for abdominal pain, diarrhea, nausea and vomiting.  Genitourinary: Negative for dysuria and hematuria.  Neurological: Negative for dizziness, sensory change and focal weakness.  All other systems reviewed and are negative.   Nutrition: heart Healthy Tolerating Diet: Yes Tolerating PT: Home health at discharge.    DRUG ALLERGIES:   Allergies  Allergen Reactions  . Amoxicillin     Other reaction(s): Localized superficial swelling of skin Face swelling   . Reglan [Metoclopramide]     Involuntary movements body    VITALS:  Blood pressure (!) 100/52, pulse 76, temperature 97.4 F (36.3 C), temperature source Oral, resp. rate 18, height 5\' 8"  (1.727 m), weight 77 kg (169 lb 12.8 oz), SpO2 99 %.  PHYSICAL EXAMINATION:   Physical Exam  GENERAL:  81 y.o.-year-old patient sitting up in chair in no acute distress.  EYES: Legally blind. No scleral icterus. Extraocular muscles intact.  HEENT: Head atraumatic, normocephalic. Oropharynx and nasopharynx clear.  NECK:  Supple, no jugular venous distention. No thyroid enlargement, no tenderness.  LUNGS:crackles R>L bases, no wheezing, bibasilar rales, no rhonchi. No use of accessory muscles of respiration.  CARDIOVASCULAR: S1, S2 normal.3/6 SEM at LSB, No rubs, or  gallops.  ABDOMEN: Soft, nontender, ND Bowel sounds present. No organomegaly or mass.  EXTREMITIES: No cyanosis, clubbing, + 2 edema b/l.    Legs wrapped in UNNA boots.  scrotal swelling NEUROLOGIC: Cranial nerves II through XII are intact. No focal Motor or sensory deficits b/l.  PSYCHIATRIC: The patient is alert and oriented x 3.  SKIN: No obvious rash, lesion, or ulcer.    LABORATORY PANEL:   CBC  Recent Labs Lab 06/06/16 0516  WBC 24.2*  HGB 13.0  HCT 44.3  PLT 330   ------------------------------------------------------------------------------------------------------------------  Chemistries   Recent Labs Lab 06/03/16 0524  06/06/16 0516  NA 137  < > 142  K 4.5  < > 3.7  CL 97*  < > 97*  CO2 27  < > 34*  GLUCOSE 110*  < > 151*  BUN 57*  < > 64*  CREATININE 1.98*  < > 1.70*  CALCIUM 8.8*  < > 9.4  AST 42*  --   --   ALT 11*  --   --   ALKPHOS 120  --   --   BILITOT 1.0  --   --   < > = values in this interval not displayed. ------------------------------------------------------------------------------------------------------------------  Cardiac Enzymes  Recent Labs Lab 06/01/16 1603  TROPONINI 0.14*   ------------------------------------------------------------------------------------------------------------------  RADIOLOGY:  No results found.   ASSESSMENT AND PLAN:   81 year old male with past medical history of hypertension, pulmonary hypertension, diastolic CHF, moderate aortic stenosis, history of GERD, prostate cancer who presented to the hospital due to shortness of breath and worsening lower extremity edema.  1.Acute on chronic diastolic dysfunction with underlying  pulmonary hypertension. Continue Lasix drip  Appreciate Cards/Nephro input.  Follow I's and O's and daily weights.  2. Moderate/Severe  Aortic Stenosis contributing to CHF: As per Cardiology, he is not a great surgical candidate.   Repeat Echo shows no new changes.   He will  follow up with Cardiology as outpatient.   3. CKD Stage III: Creatinine is responded to Lasix drip.  Nephrology consulted and appreciate input and cont. Current care.    4. Leukocytosis - ?? Etiology.  LE not cellulitis but he does have chronic venous stasis. He needs to continue to wear UNNA boots.    Continue to follow WBC count in a.m, there has been a trend downward.   Chest x-ray and urine analysis is negative for acute infection  5. Hypothyroidism: Continue Synthroid.  6. LE edema due to CHF and venous stasis.  Unna boots and Lasix   7. Elevated troponin due to demand ischemia and not ACS  8. Nose bleeds: Nasal saline   Management plans discussed with the patient and wife and he is in agreement CODE STATUS: DNR  DVT Prophylaxis: Hep SQ  TOTAL TIME TAKING CARE OF THIS PATIENT: 24 minutes.   POSSIBLE D/C IN 2 DAYS, DEPENDING ON CLINICAL CONDITION.   Pacen Watford M.D on 06/06/2016 at 7:57 AM  Between 7am to 6pm - Pager - [phone]  After 6pm go to [email or web address] - Technical brewer [facility]  Office  [phone]  CC: Primary care physician; Tracie Harrier, MD

## 2016-06-06 NOTE — Progress Notes (Signed)
DR Benjie Karvonen was informed about pt chf  Vest reading of 51 % also that pt is having slight rectal bleeding due to constipation , will continue to monitor , see order

## 2016-06-06 NOTE — Progress Notes (Signed)
Patient Name: Nathan Kramer Date of Encounter: 06/06/2016  Primary Cardiologist: [facility] Problem List     Principal Problem:   Acute on chronic diastolic CHF (congestive heart failure), NYHA class 3 (HCC) Active Problems:   Aortic stenosis, moderate   Acute kidney injury superimposed on CKD (HCC)   Dietary noncompliance   HTN (hypertension)   Cellulitis     Subjective   Breathing about the same. Remains on nasal cannula. UOP net -5.6 L for the admission. Weight up 3 pounds for the admission. LE swelling persists. Some hematuria noted via Foley catheter. Mild epistaxis this morning earlier. Renal function improving. No chest pain.   Inpatient Medications    Scheduled Meds: . aspirin EC  81 mg Oral Daily  . feeding supplement (ENSURE ENLIVE)  237 mL Oral TID BM  . ferrous gluconate  324 mg Oral BID WC  . heparin  5,000 Units Subcutaneous Q8H  . levothyroxine  25 mcg Oral QAC breakfast  . morphine  30 mg Oral TID WC  . potassium chloride  10 mEq Oral BID  . sodium chloride flush  3 mL Intravenous Q12H  . sodium chloride flush  3 mL Intravenous Q12H  . spironolactone  25 mg Oral Daily   Continuous Infusions: . furosemide (LASIX) infusion 8 mg/hr (06/05/16 1901)   PRN Meds: sodium chloride, acetaminophen **OR** acetaminophen, HYDROcodone-acetaminophen, ondansetron **OR** ondansetron (ZOFRAN) IV, senna-docusate, sodium chloride, sodium chloride flush   Vital Signs    Vitals:   06/05/16 1230 06/05/16 1946 06/06/16 0403 06/06/16 0846  BP: (!) 101/46 (!) 115/46 (!) 100/52 (!) 106/41  Pulse: 81 83 76 89  Resp: 16 18 18 20   Temp: 98.2 F (36.8 C) 98.1 F (36.7 C) 97.4 F (36.3 C) 97.9 F (36.6 C)  TempSrc: Oral Oral Oral Oral  SpO2: 97% 98% 99% 96%  Weight:   169 lb 12.8 oz (77 kg)   Height:        Intake/Output Summary (Last 24 hours) at 06/06/16 0914 Last data filed at 06/06/16 0700  Gross per 24 hour  Intake            521.6 ml  Output              2400 ml  Net          -1878.4 ml   Filed Weights   06/01/16 0415 06/04/16 0321 06/06/16 0403  Weight: 166 lb 11.2 oz (75.6 kg) 171 lb 6.4 oz (77.7 kg) 169 lb 12.8 oz (77 kg)    Physical Exam    GEN: Well nourished, well developed, in no acute distress.  HEENT: Grossly normal. Mild epistaxis.  Neck: Supple, JVD elevated to the jaw, no carotid bruits, or masses. Cardiac: RRR,III/VI systolic murmur at RUSB, I/VI diastolic murmur LUSB, no rubs, or gallops. No clubbing, cyanosis, 2+ pitting LE edema persists to the scrotum. Lower legs wrapped. Radials/DP/PT 2+ and equal bilaterally.  Respiratory:  Decreased breath sounds bilaterally with bibasilar crackles bilaterally. GI: Soft, nontender, anasarca, BS + x 4. MS: no deformity or atrophy. Skin: warm and dry, no rash. Foley with hematuria noted.  Neuro:  Strength and sensation are intact. Psych: AAOx3.  Normal affect.  Labs    CBC  Recent Labs  06/06/16 0516  WBC 24.2*  HGB 13.0  HCT 44.3  MCV 65.4*  PLT [id number]   Basic Metabolic Panel  Recent Labs  06/04/16 0444 06/05/16 0459 06/06/16 0516  NA 139 139 142  K 3.7 3.6 3.7  CL 98* 99* 97*  CO2 30 31 34*  GLUCOSE 101* 119* 151*  BUN 63* 64* 64*  CREATININE 1.83* 1.85* 1.70*  CALCIUM 9.0 9.2 9.4  PHOS 3.4  --   --    Liver Function Tests  Recent Labs  06/04/16 0444  ALBUMIN 3.6   No results for input(s): LIPASE, AMYLASE in the last 72 hours. Cardiac Enzymes No results for input(s): CKTOTAL, CKMB, CKMBINDEX, TROPONINI in the last 72 hours. BNP Invalid input(s): POCBNP D-Dimer No results for input(s): DDIMER in the last 72 hours. Hemoglobin A1C No results for input(s): HGBA1C in the last 72 hours. Fasting Lipid Panel No results for input(s): CHOL, HDL, LDLCALC, TRIG, CHOLHDL, LDLDIRECT in the last 72 hours. Thyroid Function Tests No results for input(s): TSH, T4TOTAL, T3FREE, THYROIDAB in the last 72 hours.  Invalid input(s): FREET3  Telemetry    Sinus  rhythm, 90's bpm - Personally Reviewed  ECG    n/a - Personally Reviewed  Radiology    No results found.  Cardiac Studies   2D Echocardiogram 3.1.2018  Study Conclusions  - Left ventricle: The cavity size was normal. There was mild concentric hypertrophy. Systolic function was normal. The estimated ejection fraction was in the range of 50% to 55%. Wall motion was normal; there were no regional wall motion abnormalities. Features are consistent with a pseudonormal left ventricular filling pattern, with concomitant abnormal relaxation and increased filling pressure (grade 2 diastolic dysfunction). - Ventricular septum: The contour showed systolic flattening. These changes are consistent with RV pressure overload. - Aortic valve: There was moderate to severe stenosis. There was moderate regurgitation. Mean gradient (S): 22 mm Hg. Valve area (VTI): 0.91 cm^2. - Mitral valve: Calcified annulus. Mildly thickened, mildly calcified leaflets . There was mild regurgitation. - Left atrium: The atrium was mildly dilated. - Right atrium: The atrium was mildly dilated. - Pulmonary arteries: Systolic pressure was moderately increased. PA peak pressure: 55 mm Hg (S).  Patient Profile     81 y.o. male with history of diastolic CHF, mod to sev AS, mod AI, prior stroke, and HTN, who was admitted 3/1 with progressive dyspnea, wt gain, and CHF.  Assessment & Plan    1. Acute on chronic diastolic CHF: -Has responded well to IV diuresis, though still with marked volume overload  -Net -5.6 L for the admission and -1.8 L for the past 24 hours -Weight remains up, he is up 3 pounds for the admission per documented weights -Renal function continues to improve with IV diuresis, bicarb slightly up today -Continue Lasix gtt -Dry weight of approximately 146 pounds in 02/2016  2. Acute on CKD stage III: -SCr stable to improving -Nephrology on board  3. Moderate to severe  AS: -Likely contributing to his CHF -Will need outpatient follow up, with consideration of TAVR clinic, though his CKD may make him a poor candidate for this -Uncertain if he would want AVR as in the past he has declined this  4. Elevated troponin: -Likely in the setting of #1 -No chest pain -Low-risk Myoview in 10/2015 -Echo this admission with normal EF -No further plans for ischemic evaluation at this time  Signed, Marcille Blanco [place] Pager: [phone] 06/06/2016, 9:14 AM

## 2016-06-07 ENCOUNTER — Inpatient Hospital Stay: Payer: Medicare Other

## 2016-06-07 LAB — CBC
HCT: 42.5 % (ref 40.0–52.0)
Hemoglobin: 12.7 g/dL — ABNORMAL LOW (ref 13.0–18.0)
MCH: 19 pg — ABNORMAL LOW (ref 26.0–34.0)
MCHC: 29.8 g/dL — AB (ref 32.0–36.0)
MCV: 63.6 fL — ABNORMAL LOW (ref 80.0–100.0)
PLATELETS: 369 10*3/uL (ref 150–440)
RBC: 6.69 MIL/uL — ABNORMAL HIGH (ref 4.40–5.90)
RDW: 22.1 % — AB (ref 11.5–14.5)
WBC: 30.9 10*3/uL — ABNORMAL HIGH (ref 3.8–10.6)

## 2016-06-07 LAB — BASIC METABOLIC PANEL
Anion gap: 9 (ref 5–15)
BUN: 67 mg/dL — ABNORMAL HIGH (ref 6–20)
CALCIUM: 9.4 mg/dL (ref 8.9–10.3)
CO2: 35 mmol/L — ABNORMAL HIGH (ref 22–32)
CREATININE: 1.72 mg/dL — AB (ref 0.61–1.24)
Chloride: 97 mmol/L — ABNORMAL LOW (ref 101–111)
GFR, EST AFRICAN AMERICAN: 42 mL/min — AB (ref >60)
GFR, EST NON AFRICAN AMERICAN: 36 mL/min — AB (ref >60)
Glucose, Bld: 131 mg/dL — ABNORMAL HIGH (ref 65–99)
Potassium: 3.7 mmol/L (ref 3.5–5.1)
SODIUM: 141 mmol/L (ref 135–145)

## 2016-06-07 LAB — CREATININE, URINE, RANDOM: Creatinine, Urine: 81 mg/dL

## 2016-06-07 LAB — GLUCOSE, CAPILLARY: GLUCOSE-CAPILLARY: 104 mg/dL — AB (ref 65–99)

## 2016-06-07 LAB — SODIUM, URINE, RANDOM: Sodium, Ur: 13 mmol/L

## 2016-06-07 MED ORDER — ALBUMIN HUMAN 5 % IV SOLN
12.5000 g | Freq: Every day | INTRAVENOUS | Status: DC
  Administered 2016-06-07: 12.5 g via INTRAVENOUS
  Filled 2016-06-07 (×2): qty 250

## 2016-06-07 MED ORDER — DOCUSATE SODIUM 100 MG PO CAPS
100.0000 mg | ORAL_CAPSULE | Freq: Two times a day (BID) | ORAL | Status: DC
  Administered 2016-06-07 – 2016-06-10 (×7): 100 mg via ORAL
  Filled 2016-06-07 (×7): qty 1

## 2016-06-07 NOTE — Care Management Important Message (Signed)
Important Message  Patient Details  Name: Nathan Kramer MRN: [id number] Date of Birth: September 16, 1935   Medicare Important Message Given:  Yes    Beverly Sessions, RN 06/07/2016, 5:14 PM

## 2016-06-07 NOTE — Progress Notes (Signed)
Physical Therapy Treatment Patient Details Name: Nathan Kramer MRN: [id number] DOB: 1935/10/24 Today's Date: 06/07/2016    History of Present Illness Nathan Kramer is a 81 y.o. male with a known history of congestive heart failure, chronic kidney disease, anemia, prostate cancer, hypertension, CVA presented to the emergency room with swelling in both the lower legs along with redness and weeping lesions in the lower extremities. Patient has serous sanguineous fluid weeping from the lower extremity skin.  He also complained of shortness of breath for the last couple of weeks. Has history of orthopnea. He was evaluated in the emergency room his BNP was elevated. He was given IV Lasix for diuresis and IV vancomycin and divided was started for lower extremity cellulitis. Troponin was elevated but patient also had elevation in creatinine. Elevated troponin could be secondary to demand ischemia and renal insufficiency. EKG normal sinus rhythm with no ST segment elevation. Patient was put on oxygen via nasal cannula in the emergency room. Hospitalist service was consulted for further care of the patient. Pt is now admitted for acute on chronic CHF with history of moderate/severe aortic stenosis. He is eager to work with physical therapy on this date.     PT Comments    Pt receiving albumin infusion at time of arrival. He is currently hypotensive and RN requested to defer ambulation at this time. He is able to perform seated exercises with some increase in R knee pain. Pt reports feeling depressed and becomes tearful during session. RN notified and agreed to discuss with MD. Pt also agreed to chaplain visit. Will continue to progress therapy as tolerated. Pt will benefit from skilled PT services to address deficits in strength, balance, and mobility in order to return to full function at home.    Follow Up Recommendations  Home health PT     Equipment Recommendations  Rolling walker with 5" wheels     Recommendations for Other Services       Precautions / Restrictions Precautions Precautions: Fall Restrictions Weight Bearing Restrictions: No    Mobility  Bed Mobility               General bed mobility comments: Received upright in recliner  Transfers                 General transfer comment: Pt receiving albumin infusion. He is currently hypotensive. RN requested to defer ambulation at this time  Ambulation/Gait                 Stairs            Wheelchair Mobility    Modified Rankin (Stroke Patients Only)       Balance                                    Cognition Arousal/Alertness: Awake/alert Behavior During Therapy: WFL for tasks assessed/performed Overall Cognitive Status: Within Functional Limits for tasks assessed                      Exercises General Exercises - Lower Extremity Long Arc Quad: Strengthening;Both;Seated;15 reps Heel Slides: Strengthening;Seated;15 reps;Left Hip ABduction/ADduction: Strengthening;Both;Seated;15 reps Hip Flexion/Marching: Strengthening;Both;15 reps;Seated    General Comments        Pertinent Vitals/Pain Pain Assessment: 0-10 Pain Score: 5  Pain Location: R knee and tailbone. Examined tailbone and it is very red but blanchable. RN notified who brings  sacral pad Pain Intervention(s): Monitored during session    Home Living                      Prior Function            PT Goals (current goals can now be found in the care plan section) Acute Rehab PT Goals Patient Stated Goal: Return to prior level of function at home. "I want to live a long time." PT Goal Formulation: With patient Time For Goal Achievement: 06/19/16 Potential to Achieve Goals: Good Progress towards PT goals: Progressing toward goals    Frequency    Min 2X/week      PT Plan Current plan remains appropriate    Co-evaluation             End of Session   Activity  Tolerance: Patient tolerated treatment well Patient left: in chair;with call bell/phone within reach;with chair alarm set Nurse Communication: Mobility status (Pt expresses feeling depressed, redness on coccyx) PT Visit Diagnosis: Unsteadiness on feet (R26.81);Muscle weakness (generalized) (M62.81)     Time: 0122-2411 PT Time Calculation (min) (ACUTE ONLY): 30 min  Charges:  $Therapeutic Exercise: 8-22 mins                    G Codes:      Lyndel Safe Huprich PT, DPT   Huprich,Jason 06/07/2016, 5:35 PM

## 2016-06-07 NOTE — Progress Notes (Signed)
Patient Name: Nathan Kramer Date of Encounter: 06/07/2016  Primary Cardiologist: [facility] Problem List     Principal Problem:   Acute on chronic diastolic CHF (congestive heart failure), NYHA class 3 (HCC) Active Problems:   Aortic stenosis, moderate   Acute kidney injury superimposed on CKD (HCC)   Dietary noncompliance   HTN (hypertension)   Cellulitis     Subjective   No further constipation. Breathing continues to improve. Negative 1.9 L for the past 24 hours with a net - 7.5 L for the admission. Weight remains 3 pounds up for the admission. Blood tinged urine again noted along Foley catheter. LE swelling with erythema persists. UNNA boots in place. WBC remains elevated and is up trending this morning at 30. Renal function stable at 1.72. BP soft in the 67'H systolic.   Inpatient Medications    Scheduled Meds: . aspirin EC  81 mg Oral Daily  . feeding supplement (ENSURE ENLIVE)  237 mL Oral TID BM  . ferrous gluconate  324 mg Oral BID WC  . heparin  5,000 Units Subcutaneous Q8H  . levothyroxine  25 mcg Oral QAC breakfast  . morphine  30 mg Oral TID WC  . potassium chloride  10 mEq Oral BID  . sodium chloride flush  3 mL Intravenous Q12H  . sodium chloride flush  3 mL Intravenous Q12H  . spironolactone  25 mg Oral Daily   Continuous Infusions: . furosemide (LASIX) infusion 8 mg/hr (06/06/16 1018)   PRN Meds: sodium chloride, acetaminophen **OR** acetaminophen, bisacodyl, HYDROcodone-acetaminophen, ondansetron **OR** ondansetron (ZOFRAN) IV, senna-docusate, sodium chloride, sodium chloride flush   Vital Signs    Vitals:   06/06/16 1112 06/06/16 1933 06/07/16 0401 06/07/16 0528  BP: (!) 107/38 (!) 94/50 (!) 91/48   Pulse: 80 89 88   Resp: 18 18 16    Temp: 97.8 F (36.6 C) 98.3 F (36.8 C) 98.3 F (36.8 C)   TempSrc: Oral Oral Oral   SpO2: 100% 100% 100%   Weight:    169 lb 1.6 oz (76.7 kg)  Height:        Intake/Output Summary (Last 24 hours)  at 06/07/16 0807 Last data filed at 06/07/16 4193  Gross per 24 hour  Intake           379.73 ml  Output             2325 ml  Net         -1945.27 ml   Filed Weights   06/04/16 0321 06/06/16 0403 06/07/16 0528  Weight: 171 lb 6.4 oz (77.7 kg) 169 lb 12.8 oz (77 kg) 169 lb 1.6 oz (76.7 kg)    Physical Exam    GEN: Well nourished, well developed, in no acute distress.  HEENT: Grossly normal.  Neck: Supple, JVD remains elevated to the jaw, no carotid bruits, or masses. Cardiac: RRR, III/VI systolic murmur, no rubs, or gallops. No clubbing, cyanosis, 2-3+ pitting LE edema.  Radials/DP/PT 2+ and equal bilaterally.  Respiratory:  Bibasilar crackles. GI: Soft, nontender, nondistended, BS + x 4. MS: no deformity or atrophy. Skin: warm and dry, no rash. Neuro:  Strength and sensation are intact. Psych: AAOx3.  Normal affect.  Labs    CBC  Recent Labs  06/06/16 0516 06/07/16 0436  WBC 24.2* 30.9*  HGB 13.0 12.7*  HCT 44.3 42.5  MCV 65.4* 63.6*  PLT 330 790   Basic Metabolic Panel  Recent Labs  06/06/16 0516 06/07/16 0436  NA 142 141  K 3.7 3.7  CL 97* 97*  CO2 34* 35*  GLUCOSE 151* 131*  BUN 64* 67*  CREATININE 1.70* 1.72*  CALCIUM 9.4 9.4   Liver Function Tests No results for input(s): AST, ALT, ALKPHOS, BILITOT, PROT, ALBUMIN in the last 72 hours. No results for input(s): LIPASE, AMYLASE in the last 72 hours. Cardiac Enzymes No results for input(s): CKTOTAL, CKMB, CKMBINDEX, TROPONINI in the last 72 hours. BNP Invalid input(s): POCBNP D-Dimer No results for input(s): DDIMER in the last 72 hours. Hemoglobin A1C No results for input(s): HGBA1C in the last 72 hours. Fasting Lipid Panel No results for input(s): CHOL, HDL, LDLCALC, TRIG, CHOLHDL, LDLDIRECT in the last 72 hours. Thyroid Function Tests No results for input(s): TSH, T4TOTAL, T3FREE, THYROIDAB in the last 72 hours.  Invalid input(s): FREET3  Telemetry    NSR, 70's bpm, 2 short runs of atrial  tachycardia - Personally Reviewed  ECG    n/a - Personally Reviewed  Radiology    No results found.  Cardiac Studies   2D Echocardiogram3.1.2018  Study Conclusions  - Left ventricle: The cavity size was normal. There was mild concentric hypertrophy. Systolic function was normal. The estimated ejection fraction was in the range of 50% to 55%. Wall motion was normal; there were no regional wall motion abnormalities. Features are consistent with a pseudonormal left ventricular filling pattern, with concomitant abnormal relaxation and increased filling pressure (grade 2 diastolic dysfunction). - Ventricular septum: The contour showed systolic flattening. These changes are consistent with RV pressure overload. - Aortic valve: There was moderate to severe stenosis. There was moderate regurgitation. Mean gradient (S): 22 mm Hg. Valve area (VTI): 0.91 cm^2. - Mitral valve: Calcified annulus. Mildly thickened, mildly calcified leaflets . There was mild regurgitation. - Left atrium: The atrium was mildly dilated. - Right atrium: The atrium was mildly dilated. - Pulmonary arteries: Systolic pressure was moderately increased. PA peak pressure: 55 mm Hg (S).  Patient Profile     81 y.o. male with history of diastolic CHF, mod to sev AS, mod AI, prior stroke, and HTN, who was admitted 3/1 with progressive dyspnea, wt gain, and CHF.  Assessment & Plan    1. Acute on chronic diastolic CHF/anasarca/LE swelling: -Has responded well to IV diuresis, though still with marked volume overload with significant JVD elevation to the jaw, abdominal distension, and LE swelling -Net -7.4 L for the admission and -1.9 L for the past 24 hours -Weight remains up, he is up 3 pounds for the admission per documented weights -Renal function stable with IV diuresis, bicarb slightly up today -Continue Lasix gtt at 8 mg/hr -Status post IV albumin -Would likely benefit from metolazone  though his BP is soft -Dry weight of approximately 146 pounds in 02/2016  2. Acute on CKD stage III: -SCr stable  -Nephrology on board  3. Moderate to severe AS: -Likely contributing to his CHF -Will need outpatient follow up, with consideration of TAVR clinic, though his CKD may make him a poor candidate for this -Uncertain if he would want AVR as in the past he has declined this  4. Elevated troponin: -Likely in the setting of #1 -No chest pain -Low-risk Myoview in 10/2015 -Echo this admission with normal EF -No further plans for ischemic evaluation at this time  5. Leukocytosis: -WBC count remains elevated and is up trending -Was previously on vancomycin, though this has been discontinued -LE remain erythematous above UNNA boots -Consider CXR and urine Cx with  reinitiation of ABX, defer to IM   Signed, Christell Faith, PA-C [place] Pager: [phone] 06/07/2016, 8:07 AM   Attending Note Patient seen and examined, agree with detailed note above,  Patient presentation and plan discussed on rounds.   Continues to have significant urination Neg more than 7 L so far Continues on Lasix infusion Still with woody firm edema of his thighs, sacral edema Reports that he pulled on his Foley catheter yesterday, some traumatic hematuria developing  On physical examination he has clear lung sounds with dullness the bases, sacral edema, abdomen mildly distended, nontender, 2+ woody edema of his thighs extending into the buttocks, no wraps below the knees  Lab work reviewed showing stable creatinine 1.72, BUN 67  ----Acute on chronic diastolic CHF Given continued massive fluid overload, JVD, 2+ pitting woody leg edema, would continue IV infusion Suspect he will need another week of diuresis as we have seen no improvement in the woody thigh edema and minimal improvement of sacral edema. Abdomen mildly improved. Each day we round, provide additional CHF training. Patient seems  to be unable to comprehend the details, wife will have to manage him at home.   ----Hematuria He pulled on his Foley catheter causing traumatic injury, some hematuria   Other details as above Discussed need for continued diuresis with patient and wife in detail Greater than 50% was spent in counseling and coordination of care with patient Total encounter time 25 minutes or more   Signed: Esmond Plants  M.D., Ph.D. [facility]

## 2016-06-07 NOTE — Progress Notes (Signed)
[facility] [place] NAME: Nathan Kramer    MR#:  [id number]  DATE OF BIRTH:  [birth year]  SUBJECTIVE:   Patient finally had a large bowel movement yesterday. Patient's on Lasix drip  REVIEW OF SYSTEMS:    Review of Systems  Constitutional: Negative for chills.  HENT: Positive for nosebleeds. Negative for congestion and tinnitus.   Eyes: Negative for blurred vision and double vision.  Respiratory: Positive for shortness of breath (better). Negative for cough and wheezing.   Cardiovascular: Positive for leg swelling. Negative for chest pain, orthopnea and PND.  Gastrointestinal: Negative for abdominal pain, diarrhea, nausea and vomiting.  Genitourinary: Negative for dysuria and hematuria.       Scrotal swelling  Neurological: Negative for dizziness, sensory change and focal weakness.  All other systems reviewed and are negative.   Nutrition: heart Healthy Tolerating Diet: Yes Tolerating PT: Home health at discharge.    DRUG ALLERGIES:   Allergies  Allergen Reactions  . Amoxicillin     Other reaction(s): Localized superficial swelling of skin Face swelling   . Reglan [Metoclopramide]     Involuntary movements body    VITALS:  Blood pressure (!) 91/48, pulse 88, temperature 98.3 F (36.8 C), temperature source Oral, resp. rate 16, height 5\' 8"  (1.727 m), weight 76.7 kg (169 lb 1.6 oz), SpO2 100 %.  PHYSICAL EXAMINATION:   Physical Exam  GENERAL:  81 y.o.-year-old patient sitting up in chair in no acute distress.  EYES: Legally blind. No scleral icterus. Extraocular muscles intact.  HEENT: Head atraumatic, normocephalic. Oropharynx and nasopharynx clear.  NECK:  Supple, no jugular venous distention. No thyroid enlargement, no tenderness.  LUNGS:crackles R>L bases, no wheezing, bibasilar rales, no rhonchi. No use of accessory muscles of respiration.  CARDIOVASCULAR: S1, S2 normal.3/6 SEM at LSB, No rubs, or gallops.   ABDOMEN: Soft, nontender, ND Bowel sounds present. No organomegaly or mass.  EXTREMITIES: No cyanosis, clubbing, + 2 edema b/l.    Legs wrapped in UNNA boots.  scrotal swelling NEUROLOGIC: Cranial nerves II through XII are intact. No focal Motor or sensory deficits b/l.  PSYCHIATRIC: The patient is alert and oriented x 3.  SKIN: No obvious rash, lesion, or ulcer.    LABORATORY PANEL:   CBC  Recent Labs Lab 06/07/16 0436  WBC 30.9*  HGB 12.7*  HCT 42.5  PLT 369   ------------------------------------------------------------------------------------------------------------------  Chemistries   Recent Labs Lab 06/03/16 0524  06/07/16 0436  NA 137  < > 141  K 4.5  < > 3.7  CL 97*  < > 97*  CO2 27  < > 35*  GLUCOSE 110*  < > 131*  BUN 57*  < > 67*  CREATININE 1.98*  < > 1.72*  CALCIUM 8.8*  < > 9.4  AST 42*  --   --   ALT 11*  --   --   ALKPHOS 120  --   --   BILITOT 1.0  --   --   < > = values in this interval not displayed. ------------------------------------------------------------------------------------------------------------------  Cardiac Enzymes  Recent Labs Lab 06/01/16 1603  TROPONINI 0.14*   ------------------------------------------------------------------------------------------------------------------  RADIOLOGY:  No results found.   ASSESSMENT AND PLAN:   81 year old male with past medical history of hypertension, pulmonary hypertension, diastolic CHF, moderate aortic stenosis, history of GERD, prostate cancer who presented to the hospital due to shortness of breath and worsening lower extremity edema.  1.Acute on chronic diastolic dysfunction with underlying  pulmonary hypertension: Still with significant fluid Continue Lasix drip,Monitor BMP Appreciate Cards/Nephro input.  Follow I's and O's and daily weights.  2. Moderate/Severe  Aortic Stenosis contributing to CHF: As per Cardiology, he is not a great surgical candidate.   Repeat Echo  shows no new changes.   He will follow up with Cardiology as outpatient.   3. CKD Stage III: Creatinine is spotting to Lasix drip.  Nephrology consulted and appreciate input and cont. Current care.    4. Leukocytosis - unclear etiology of leukocytosis. Patient is without fever. Urine analysis and chest x-ray was negative for infection. Continue to monitor CBC White blood cell count up to 31 this morning  5. Hypothyroidism: Continue Synthroid.  6. LE edema due to CHF and venous stasis.  Unna boots and Lasix   7. Elevated troponin due to demand ischemia and not ACS  8. Nose bleeds: Nasal saline  9. Constipation: Continue daily  stool softeners  Management plans discussed with the patient and wife and he is in agreement CODE STATUS: DNR  DVT Prophylaxis: Hep SQ  TOTAL TIME TAKING CARE OF THIS PATIENT: 24 minutes.   POSSIBLE D/C IN 4-6 DAYS, DEPENDING ON CLINICAL CONDITION.   Tylisa Alcivar M.D on 06/07/2016 at 10:33 AM  Between 7am to 6pm - Pager - [phone]  After 6pm go to [email or web address] - Technical brewer [place] [facility]  Office  [phone]  CC: Primary care physician; Tracie Harrier, MD

## 2016-06-07 NOTE — Progress Notes (Signed)
[facility]  ROUNDING NOTE   Subjective:   Patient is continued on Lasix drip at 8 mg per hour.  He has good response.  His serum creatinine is stable at 1.7  He continues to have some lower extremity edema. UOP 2300 cc Blood tinged urine  Objective:  Vital signs in last 24 hours:  Temp:  [97.8 F (36.6 C)-98.3 F (36.8 C)] 97.8 F (36.6 C) (03/07 1115) Pulse Rate:  [84-89] 84 (03/07 1115) Resp:  [16-18] 18 (03/07 1115) BP: (91-94)/(45-50) 93/45 (03/07 1115) SpO2:  [100 %] 100 % (03/07 1115) Weight:  [76.7 kg (169 lb 1.6 oz)] 76.7 kg (169 lb 1.6 oz) (03/07 0528)  Weight change: -0.318 kg (-11.2 oz) Filed Weights   06/04/16 0321 06/06/16 0403 06/07/16 0528  Weight: 77.7 kg (171 lb 6.4 oz) 77 kg (169 lb 12.8 oz) 76.7 kg (169 lb 1.6 oz)    Intake/Output: I/O last 3 completed shifts: In: 643.7 [P.O.:360; I.V.:283.7] Out: 1761 [Urine:3425]   Intake/Output this shift:  Total I/O In: 240 [P.O.:240] Out: 100 [Urine:100]  Physical Exam: General: NAD, sitting in chair  Head: Normocephalic, atraumatic. Moist oral mucosal membranes  Eyes: Anicteric,  Neck: Supple, trachea midline, +JVD  Lungs:  Bilateral wheezes  Heart: +murmur, irregular rhythm  Abdomen:  Soft, nontender, abdominal wall edema ++  Extremities: UNNA Boots bilaterally, 2+ peripheral edema.  Neurologic: Nonfocal, moving all four extremities  Skin: No lesions  GU: Foley with blood tinged urine     Basic Metabolic Panel:  Recent Labs Lab 06/03/16 0524 06/04/16 0444 06/05/16 0459 06/06/16 0516 06/07/16 0436  NA 137 139 139 142 141  K 4.5 3.7 3.6 3.7 3.7  CL 97* 98* 99* 97* 97*  CO2 27 30 31  34* 35*  GLUCOSE 110* 101* 119* 151* 131*  BUN 57* 63* 64* 64* 67*  CREATININE 1.98* 1.83* 1.85* 1.70* 1.72*  CALCIUM 8.8* 9.0 9.2 9.4 9.4  PHOS  --  3.4  --   --   --     Liver Function Tests:  Recent Labs Lab 06/03/16 0524 06/04/16 0444  AST 42*  --   ALT 11*  --   ALKPHOS 120  --    BILITOT 1.0  --   PROT 6.1*  --   ALBUMIN 3.3* 3.6   No results for input(s): LIPASE, AMYLASE in the last 168 hours. No results for input(s): AMMONIA in the last 168 hours.  CBC:  Recent Labs Lab 05/31/16 2251 06/01/16 0455 06/03/16 0524 06/06/16 0516 06/07/16 0436  WBC 28.0* 25.9* 27.9* 24.2* 30.9*  HGB 14.1 13.0 13.2 13.0 12.7*  HCT 45.7 40.6 43.0 44.3 42.5  MCV 63.7* 63.6* 64.1* 65.4* 63.6*  PLT 281 258 271 330 369    Cardiac Enzymes:  Recent Labs Lab 06/01/16 0020 06/01/16 0455 06/01/16 1000 06/01/16 1603  TROPONINI 0.18* 0.17* 0.16* 0.14*    BNP: Invalid input(s): POCBNP  CBG:  Recent Labs Lab 06/07/16 0748  GLUCAP 104*    Microbiology: Results for orders placed or performed during the hospital encounter of 05/31/16  Blood culture (routine x 2)     Status: None   Collection Time: 06/01/16 12:20 AM  Result Value Ref Range Status   Specimen Description BLOOD  L WRIST  Final   Special Requests BOTTLES DRAWN AEROBIC AND ANAEROBIC  ADEQUATE   Final   Culture NO GROWTH 5 DAYS  Final   Report Status 06/06/2016 FINAL  Final  Blood culture (routine x 2)  Status: None   Collection Time: 06/01/16  1:11 AM  Result Value Ref Range Status   Specimen Description BLOOD  R FOREARM  Final   Special Requests BOTTLES DRAWN AEROBIC AND ANAEROBIC  ADEQUATE  Final   Culture NO GROWTH 5 DAYS  Final   Report Status 06/06/2016 FINAL  Final    Coagulation Studies: No results for input(s): LABPROT, INR in the last 72 hours.  Urinalysis: No results for input(s): COLORURINE, LABSPEC, PHURINE, GLUCOSEU, HGBUR, BILIRUBINUR, KETONESUR, PROTEINUR, UROBILINOGEN, NITRITE, LEUKOCYTESUR in the last 72 hours.  Invalid input(s): APPERANCEUR    Imaging: Dg Chest 1 View  Result Date: 06/07/2016 CLINICAL DATA:  Pneumonia. History of hypertension, congestive heart failure and aortic stenosis. EXAM: CHEST 1 VIEW COMPARISON:  05/31/2016 and 01/12/2016 radiographs. FINDINGS: 1052  hour. The heart size and mediastinal contours are stable. There is aortic atherosclerosis. There is chronic vascular congestion with right-greater-than-left pleural thickening and mild basilar scarring. No overt pulmonary edema, confluent airspace opacity or pneumothorax. The bones appear unchanged. IMPRESSION: Stable examination with chronic vascular congestion and chronic pleural thickening. No acute findings. Electronically Signed   By: Richardean Sale M.D.   On: 06/07/2016 11:20     Medications:   . furosemide (LASIX) infusion 8 mg/hr (06/06/16 1018)   . aspirin EC  81 mg Oral Daily  . docusate sodium  100 mg Oral BID  . feeding supplement (ENSURE ENLIVE)  237 mL Oral TID BM  . ferrous gluconate  324 mg Oral BID WC  . heparin  5,000 Units Subcutaneous Q8H  . levothyroxine  25 mcg Oral QAC breakfast  . morphine  30 mg Oral TID WC  . potassium chloride  10 mEq Oral BID  . sodium chloride flush  3 mL Intravenous Q12H  . sodium chloride flush  3 mL Intravenous Q12H  . spironolactone  25 mg Oral Daily   sodium chloride, acetaminophen **OR** acetaminophen, bisacodyl, HYDROcodone-acetaminophen, ondansetron **OR** ondansetron (ZOFRAN) IV, senna-docusate, sodium chloride, sodium chloride flush  Assessment/ Plan:  [name] is a 81 y.o. white male with diastolic congestive heart failure, hypertension, CVA, BPH, , who was admitted to [facility] on 05/31/2016   1. Acute renal failure on chronic kidney disease stage III: with proteinuria, anasarca.  Chronic kidney disease secondary to hypertension. Acute renal failure from acute cardiorenal syndrome from diastolic congestive heart failure.  - furosemide gtt continued at 8mg /hr - will resume iv albumin for oncotic support  2.  Anasarca; Cardiac - aortic stenosis - UNNA boots - low salt diet - fluid restriction - Furosemide gtt - continue spironolactone.   3. Cellulitis: with leukocytosis - received vanco 3/1 to 3/2   LOS:  6 Nathan Kramer 3/7/20182:20 PM

## 2016-06-08 LAB — CBC
HEMATOCRIT: 44.8 % (ref 40.0–52.0)
Hemoglobin: 13.1 g/dL (ref 13.0–18.0)
MCH: 19 pg — AB (ref 26.0–34.0)
MCHC: 29.2 g/dL — AB (ref 32.0–36.0)
MCV: 65.3 fL — AB (ref 80.0–100.0)
Platelets: 363 10*3/uL (ref 150–440)
RBC: 6.87 MIL/uL — ABNORMAL HIGH (ref 4.40–5.90)
RDW: 22.6 % — ABNORMAL HIGH (ref 11.5–14.5)
WBC: 28.4 10*3/uL — ABNORMAL HIGH (ref 3.8–10.6)

## 2016-06-08 LAB — BASIC METABOLIC PANEL
Anion gap: 11 (ref 5–15)
BUN: 70 mg/dL — ABNORMAL HIGH (ref 6–20)
CHLORIDE: 96 mmol/L — AB (ref 101–111)
CO2: 33 mmol/L — AB (ref 22–32)
CREATININE: 1.69 mg/dL — AB (ref 0.61–1.24)
Calcium: 9.3 mg/dL (ref 8.9–10.3)
GFR calc non Af Amer: 37 mL/min — ABNORMAL LOW (ref >60)
GFR, EST AFRICAN AMERICAN: 43 mL/min — AB (ref >60)
GLUCOSE: 134 mg/dL — AB (ref 65–99)
Potassium: 4.2 mmol/L (ref 3.5–5.1)
Sodium: 140 mmol/L (ref 135–145)

## 2016-06-08 MED ORDER — ALUM & MAG HYDROXIDE-SIMETH 200-200-20 MG/5ML PO SUSP
30.0000 mL | Freq: Four times a day (QID) | ORAL | Status: DC | PRN
  Administered 2016-06-08: 30 mL via ORAL
  Filled 2016-06-08: qty 30

## 2016-06-08 MED ORDER — ALBUMIN HUMAN 25 % IV SOLN
12.5000 g | Freq: Every day | INTRAVENOUS | Status: DC
  Administered 2016-06-08 – 2016-06-14 (×7): 12.5 g via INTRAVENOUS
  Filled 2016-06-08 (×8): qty 50

## 2016-06-08 MED ORDER — POLYETHYLENE GLYCOL 3350 17 G PO PACK
17.0000 g | PACK | Freq: Every day | ORAL | Status: DC
  Administered 2016-06-08 – 2016-06-15 (×7): 17 g via ORAL
  Filled 2016-06-08 (×7): qty 1

## 2016-06-08 MED ORDER — METOLAZONE 2.5 MG PO TABS
2.5000 mg | ORAL_TABLET | Freq: Once | ORAL | Status: AC
  Administered 2016-06-08: 2.5 mg via ORAL
  Filled 2016-06-08: qty 1

## 2016-06-08 NOTE — Progress Notes (Signed)
[facility]  ROUNDING NOTE   Subjective:   Patient seen earlier today Lasix drip increased to 10 mg/hr UOP 1050 cc Blood tinged urine No SOB  Objective:  Vital signs in last 24 hours:  Temp:  [97.6 F (36.4 C)-98.6 F (37 C)] 97.6 F (36.4 C) (03/08 1208) Pulse Rate:  [83-92] 89 (03/08 1430) Resp:  [14-18] 14 (03/08 1208) BP: (88-118)/(46-62) 118/50 (03/08 1430) SpO2:  [96 %-100 %] 100 % (03/08 1430) Weight:  [77.5 kg (170 lb 14.4 oz)] 77.5 kg (170 lb 14.4 oz) (03/08 0519)  Weight change: 0.816 kg (1 lb 12.8 oz) Filed Weights   06/06/16 0403 06/07/16 0528 06/08/16 0519  Weight: 77 kg (169 lb 12.8 oz) 76.7 kg (169 lb 1.6 oz) 77.5 kg (170 lb 14.4 oz)    Intake/Output: I/O last 3 completed shifts: In: 1262.9 [P.O.:720; I.V.:292.9; IV Piggyback:250] Out: 2575 [Urine:2575]   Intake/Output this shift:  Total I/O In: 970 [P.O.:720; Other:250] Out: 250 [Urine:250]  Physical Exam: General: NAD, sitting in chair  Head: Normocephalic, atraumatic. Moist oral mucosal membranes  Eyes: Anicteric,  Neck: Supple, trachea midline, +JVD  Lungs:  Bilateral wheezes  Heart: +murmur, irregular rhythm  Abdomen:  Soft, nontender, abdominal wall edema ++  Extremities: UNNA Boots bilaterally, 2+ peripheral edema.  Neurologic: Nonfocal, moving all four extremities  Skin: No lesions  GU: Foley with blood tinged urine     Basic Metabolic Panel:  Recent Labs Lab 06/04/16 0444 06/05/16 0459 06/06/16 0516 06/07/16 0436 06/08/16 0614  NA 139 139 142 141 140  K 3.7 3.6 3.7 3.7 4.2  CL 98* 99* 97* 97* 96*  CO2 30 31 34* 35* 33*  GLUCOSE 101* 119* 151* 131* 134*  BUN 63* 64* 64* 67* 70*  CREATININE 1.83* 1.85* 1.70* 1.72* 1.69*  CALCIUM 9.0 9.2 9.4 9.4 9.3  PHOS 3.4  --   --   --   --     Liver Function Tests:  Recent Labs Lab 06/03/16 0524 06/04/16 0444  AST 42*  --   ALT 11*  --   ALKPHOS 120  --   BILITOT 1.0  --   PROT 6.1*  --   ALBUMIN 3.3* 3.6    No results for input(s): LIPASE, AMYLASE in the last 168 hours. No results for input(s): AMMONIA in the last 168 hours.  CBC:  Recent Labs Lab 06/03/16 0524 06/06/16 0516 06/07/16 0436 06/08/16 0614  WBC 27.9* 24.2* 30.9* 28.4*  HGB 13.2 13.0 12.7* 13.1  HCT 43.0 44.3 42.5 44.8  MCV 64.1* 65.4* 63.6* 65.3*  PLT 271 330 369 363    Cardiac Enzymes: No results for input(s): CKTOTAL, CKMB, CKMBINDEX, TROPONINI in the last 168 hours.  BNP: Invalid input(s): POCBNP  CBG:  Recent Labs Lab 06/07/16 0748  GLUCAP 104*    Microbiology: Results for orders placed or performed during the hospital encounter of 05/31/16  Blood culture (routine x 2)     Status: None   Collection Time: 06/01/16 12:20 AM  Result Value Ref Range Status   Specimen Description BLOOD  L WRIST  Final   Special Requests BOTTLES DRAWN AEROBIC AND ANAEROBIC  ADEQUATE   Final   Culture NO GROWTH 5 DAYS  Final   Report Status 06/06/2016 FINAL  Final  Blood culture (routine x 2)     Status: None   Collection Time: 06/01/16  1:11 AM  Result Value Ref Range Status   Specimen Description BLOOD  R FOREARM  Final  Special Requests BOTTLES DRAWN AEROBIC AND ANAEROBIC  ADEQUATE  Final   Culture NO GROWTH 5 DAYS  Final   Report Status 06/06/2016 FINAL  Final    Coagulation Studies: No results for input(s): LABPROT, INR in the last 72 hours.  Urinalysis: No results for input(s): COLORURINE, LABSPEC, PHURINE, GLUCOSEU, HGBUR, BILIRUBINUR, KETONESUR, PROTEINUR, UROBILINOGEN, NITRITE, LEUKOCYTESUR in the last 72 hours.  Invalid input(s): APPERANCEUR    Imaging: Dg Chest 1 View  Result Date: 06/07/2016 CLINICAL DATA:  Pneumonia. History of hypertension, congestive heart failure and aortic stenosis. EXAM: CHEST 1 VIEW COMPARISON:  05/31/2016 and 01/12/2016 radiographs. FINDINGS: 1052 hour. The heart size and mediastinal contours are stable. There is aortic atherosclerosis. There is chronic vascular congestion  with right-greater-than-left pleural thickening and mild basilar scarring. No overt pulmonary edema, confluent airspace opacity or pneumothorax. The bones appear unchanged. IMPRESSION: Stable examination with chronic vascular congestion and chronic pleural thickening. No acute findings. Electronically Signed   By: Richardean Sale M.D.   On: 06/07/2016 11:20     Medications:   . furosemide (LASIX) infusion 10 mg/hr (06/08/16 0929)   . albumin human  12.5 g Intravenous Daily  . aspirin EC  81 mg Oral Daily  . docusate sodium  100 mg Oral BID  . feeding supplement (ENSURE ENLIVE)  237 mL Oral TID BM  . ferrous gluconate  324 mg Oral BID WC  . heparin  5,000 Units Subcutaneous Q8H  . levothyroxine  25 mcg Oral QAC breakfast  . morphine  30 mg Oral TID WC  . polyethylene glycol  17 g Oral Daily  . potassium chloride  10 mEq Oral BID  . sodium chloride flush  3 mL Intravenous Q12H  . sodium chloride flush  3 mL Intravenous Q12H  . spironolactone  25 mg Oral Daily   sodium chloride, acetaminophen **OR** acetaminophen, bisacodyl, HYDROcodone-acetaminophen, ondansetron **OR** ondansetron (ZOFRAN) IV, senna-docusate, sodium chloride, sodium chloride flush  Assessment/ Plan:  Nathan Kramer is a 81 y.o. white male with diastolic congestive heart failure, hypertension, CVA, BPH, , who was admitted to [facility] on 05/31/2016   1. Acute renal failure on chronic kidney disease stage III: with proteinuria, anasarca.  Chronic kidney disease secondary to hypertension. Acute renal failure from acute cardiorenal syndrome from diastolic congestive heart failure.  - furosemide gtt  At 10 mg/hr - will resume iv albumin for oncotic support  2.  Anasarca; Cardiac - aortic stenosis. Gr 2 diastolic dysfunction - UNNA boots - low salt diet - fluid restriction - Furosemide gtt - continue spironolactone.   3. Cellulitis: with leukocytosis - received vanco 3/1 to 3/2   LOS:  7 Nathan Kramer 3/8/20184:06 PM

## 2016-06-08 NOTE — Progress Notes (Signed)
Physical Therapy Treatment Patient Details Name: Nathan Kramer MRN: [id number] DOB: 10-Jun-1935 Today's Date: 06/08/2016    History of Present Illness Nathan Kramer is a 81 y.o. male with a known history of congestive heart failure, chronic kidney disease, anemia, prostate cancer, hypertension, CVA presented to the emergency room with swelling in both the lower legs along with redness and weeping lesions in the lower extremities. Patient has serous sanguineous fluid weeping from the lower extremity skin.  He also complained of shortness of breath for the last couple of weeks. Has history of orthopnea. He was evaluated in the emergency room his BNP was elevated. He was given IV Lasix for diuresis and IV vancomycin and divided was started for lower extremity cellulitis. Troponin was elevated but patient also had elevation in creatinine. Elevated troponin could be secondary to demand ischemia and renal insufficiency. EKG normal sinus rhythm with no ST segment elevation. Patient was put on oxygen via nasal cannula in the emergency room. Hospitalist service was consulted for further care of the patient. Pt is now admitted for acute on chronic CHF with history of moderate/severe aortic stenosis. He is eager to work with physical therapy on this date.     PT Comments    Pt is very eager to work with physical therapy on this date. Pt able to ambulate a full lap around RN station with multiple standing rest breaks. Gait speed is slow but functional for household mobility. Utilized 3L/min O2 which is consistent with his current supplemental O2 in room. SaO2 remains at or above 95% throughout distance and HR WNL as well. Pt only reports mild DOE. He is able to complete seated exercises but also demonstrates fatigue requiring rest breaks. He again endorses feeling depressed and anxious today. RN notified again today and MD page but at time of note writer has not spoken with physician. Pt will benefit from skilled  PT services to address deficits in strength, balance, and mobility in order to return to full function at home.    Follow Up Recommendations  Home health PT     Equipment Recommendations  Rolling walker with 5" wheels    Recommendations for Other Services       Precautions / Restrictions Precautions Precautions: Fall Restrictions Weight Bearing Restrictions: No    Mobility  Bed Mobility               General bed mobility comments: Received upright in recliner  Transfers Overall transfer level: Needs assistance Equipment used: Rolling walker (2 wheeled) Transfers: Sit to/from Stand Sit to Stand: Supervision         General transfer comment: Pt requires slightly more effort today for transfers compared to prior sessions. However once he is upright he is steady without UE support on walker  Ambulation/Gait Ambulation/Gait assistance: Min guard   Assistive device: Rolling walker (2 wheeled) Gait Pattern/deviations: Decreased step length - right;Decreased step length - left Gait velocity: Decreased but functional for full household mobility Gait velocity interpretation: <1.8 ft/sec, indicative of risk for recurrent falls General Gait Details: Pt able to ambulate a full lap around RN station with multiple standing rest breaks. Gait speed is slow but functional for household mobility. Utilized 3L/min O2 which is consistent with his current supplemental O2 in room. SaO2 remains at or above 95% throughout distance. HR WNL during ambulation as well. Pt provided cues to keep body inside walker for safety. He has a history of R eye blindness and requires infrequent redirection for safety  Stairs            Wheelchair Mobility    Modified Rankin (Stroke Patients Only)       Balance Overall balance assessment: Needs assistance Sitting-balance support: No upper extremity supported Sitting balance-Leahy Scale: Good     Standing balance support: No upper  extremity supported Standing balance-Leahy Scale: Fair Standing balance comment: Able to maintain feet apart and together balance without UE support. Negative Rhomberg. Single leg balance approximately 2 seconds on each LE                    Cognition Arousal/Alertness: Awake/alert Behavior During Therapy: WFL for tasks assessed/performed Overall Cognitive Status: Within Functional Limits for tasks assessed                      Exercises General Exercises - Lower Extremity Long Arc Quad: Strengthening;Both;Seated;15 reps Heel Slides: Strengthening;Seated;15 reps;Left Hip ABduction/ADduction: Strengthening;Both;Seated;15 reps Hip Flexion/Marching: Strengthening;Both;15 reps;Seated    General Comments        Pertinent Vitals/Pain Pain Assessment: No/denies pain Pain Location: Denies pain initially. However after short time pt reports increased pain at site of urinary catheter insertion. Catheter inspected without obvious signs of issue except for considerable scrotal and penile edema Pain Intervention(s): Monitored during session;Patient requesting pain meds-RN notified    Home Living                      Prior Function            PT Goals (current goals can now be found in the care plan section) Acute Rehab PT Goals Patient Stated Goal: Return to prior level of function at home. "I want to live a long time." PT Goal Formulation: With patient Time For Goal Achievement: 06/19/16 Potential to Achieve Goals: Good Progress towards PT goals: Progressing toward goals    Frequency    Min 2X/week      PT Plan Current plan remains appropriate    Co-evaluation             End of Session Equipment Utilized During Treatment: Gait belt Activity Tolerance: Patient tolerated treatment well Patient left: in chair;with call bell/phone within reach;with chair alarm set Nurse Communication: Patient requests pain meds;Other (comment) (Pt reports  feelings of depression) PT Visit Diagnosis: Unsteadiness on feet (R26.81);Muscle weakness (generalized) (M62.81)     Time: 9774-1423 PT Time Calculation (min) (ACUTE ONLY): 39 min  Charges:  $Gait Training: 23-37 mins $Therapeutic Exercise: 8-22 mins                    G Codes:      Lyndel Safe Colbie Danner PT, DPT   Tyra Michelle 06/08/2016, 3:39 PM

## 2016-06-08 NOTE — Progress Notes (Signed)
[place] [facility] [employer] NAME: Nathan Kramer    MR#:  [id number]  DATE OF BIRTH:  1935/07/27  SUBJECTIVE:   Patient Still with signs of fluid overload. He continues to be on Lasix drip.  REVIEW OF SYSTEMS:    Review of Systems  Constitutional: Negative for chills and fever.  HENT: Negative for congestion, nosebleeds and tinnitus.   Eyes: Negative for blurred vision and double vision.  Respiratory: Positive for shortness of breath (better). Negative for cough and wheezing.   Cardiovascular: Positive for leg swelling. Negative for chest pain, orthopnea and PND.  Gastrointestinal: Negative for abdominal pain, blood in stool, constipation, diarrhea, melena, nausea and vomiting.  Genitourinary: Negative for dysuria and hematuria.       Scrotal swelling  Neurological: Negative for dizziness, sensory change and focal weakness.  All other systems reviewed and are negative.   Nutrition: heart Healthy Tolerating Diet: Yes Tolerating PT: Home health at discharge.    DRUG ALLERGIES:   Allergies  Allergen Reactions  . Amoxicillin     Other reaction(s): Localized superficial swelling of skin Face swelling   . Reglan [Metoclopramide]     Involuntary movements body    VITALS:  Blood pressure 111/62, pulse 84, temperature 98.6 F (37 C), temperature source Oral, resp. rate 18, height 5\' 8"  (1.727 m), weight 77.5 kg (170 lb 14.4 oz), SpO2 100 %.  PHYSICAL EXAMINATION:   Physical Exam  GENERAL:  81 y.o.-year-old patient sitting up in chair in no acute distress.  EYES: Legally blind. No scleral icterus. Extraocular muscles intact.  HEENT: Head atraumatic, normocephalic. Oropharynx and nasopharynx clear.  JVD NECK:  Supple, no jugular venous distention. No thyroid enlargement, no tenderness.  LUNGS:Clear to auscultation no wheezing, bibasilar rales, no rhonchi. No use of accessory muscles of respiration.  CARDIOVASCULAR: S1, S2 normal.3/6 SEM  at LSB, No rubs, or gallops.  ABDOMEN: Soft, nontender, ND Bowel sounds present. No organomegaly or mass.  EXTREMITIES: No cyanosis, clubbing, + 3 edema b/l.    Legs wrapped in UNNA boots.  scrotal swelling NEUROLOGIC: Cranial nerves II through XII are intact. No focal Motor or sensory deficits b/l.  PSYCHIATRIC: The patient is alert and oriented x 3.  SKIN: No obvious rash, lesion, or ulcer.    LABORATORY PANEL:   CBC  Recent Labs Lab 06/08/16 0614  WBC 28.4*  HGB 13.1  HCT 44.8  PLT 363   ------------------------------------------------------------------------------------------------------------------  Chemistries   Recent Labs Lab 06/03/16 0524  06/08/16 0614  NA 137  < > 140  K 4.5  < > 4.2  CL 97*  < > 96*  CO2 27  < > 33*  GLUCOSE 110*  < > 134*  BUN 57*  < > 70*  CREATININE 1.98*  < > 1.69*  CALCIUM 8.8*  < > 9.3  AST 42*  --   --   ALT 11*  --   --   ALKPHOS 120  --   --   BILITOT 1.0  --   --   < > = values in this interval not displayed. ------------------------------------------------------------------------------------------------------------------  Cardiac Enzymes  Recent Labs Lab 06/01/16 1603  TROPONINI 0.14*   ------------------------------------------------------------------------------------------------------------------  RADIOLOGY:  Dg Chest 1 View  Result Date: 06/07/2016 CLINICAL DATA:  Pneumonia. History of hypertension, congestive heart failure and aortic stenosis. EXAM: CHEST 1 VIEW COMPARISON:  05/31/2016 and 01/12/2016 radiographs. FINDINGS: 1052 hour. The heart size and mediastinal contours are stable. There is aortic atherosclerosis.  There is chronic vascular congestion with right-greater-than-left pleural thickening and mild basilar scarring. No overt pulmonary edema, confluent airspace opacity or pneumothorax. The bones appear unchanged. IMPRESSION: Stable examination with chronic vascular congestion and chronic pleural thickening.  No acute findings. Electronically Signed   By: Richardean Sale M.D.   On: 06/07/2016 11:20     ASSESSMENT AND PLAN:   81 year old male with past medical history of hypertension, pulmonary hypertension, diastolic CHF, moderate aortic stenosis, history of GERD, prostate cancer who presented to the hospital due to shortness of breath and worsening lower extremity edema.  1.Acute on chronic diastolic dysfunction with underlying pulmonary hypertension: Still with significant fluid and signs of fluid overload Continue Lasix drip and continued to Monitor BMP Appreciate Cards/Nephro input.  Follow I's and O's and daily weights.  2. Moderate/Severe  Aortic Stenosis contributing to CHF: As per Cardiology, he is not a great surgical candidate.   Repeat Echo shows no new changes.   He will follow up with Cardiology as outpatient.   3. CKD Stage III: Creatinine remained stable Continue to monitor [facility] Nephrology consult appreciated.   4. Leukocytosis - unclear etiology of leukocytosis. Patient is without fever. Urine analysis and chest x-ray was negative for infection. Continue to monitor CBC  5. Hypothyroidism: Continue Synthroid.  6. LE edema due to CHF and venous stasis.  Unna boots and Lasix   7. Elevated troponin due to demand ischemia and not ACS  8. Nose bleeds: Nasal saline  9. Constipation: Continue daily  stool softeners  Management plans discussed with the patient and wife and he is in agreement CODE STATUS: DNR  DVT Prophylaxis: Hep SQ  TOTAL TIME TAKING CARE OF THIS PATIENT: 23 minutes.   POSSIBLE D/C IN 5-6 DAYS, DEPENDING ON CLINICAL CONDITION.   Jennalyn Cawley M.D on 06/08/2016 at 7:58 AM  Between 7am to 6pm - Pager - [phone]  After 6pm go to [email or web address] - [employer] [facility]  Office  [phone]  CC: Primary care physician; Tracie Harrier, MD

## 2016-06-08 NOTE — Progress Notes (Signed)
Patient Name: Nathan Kramer Date of Encounter: 06/08/2016  Primary Cardiologist: [facility] Problem List     Principal Problem:   Acute on chronic diastolic CHF (congestive heart failure), NYHA class 3 (HCC) Active Problems:   Aortic stenosis, moderate   Acute kidney injury superimposed on CKD (HCC)   Dietary noncompliance   HTN (hypertension)   Cellulitis     Subjective   Breathing continues to slowly improve on Lasix gtt. Remains significantly volume overloaded. Renal function continues to improve. Net positive for the past 24 hours, UOP 7.4 L for the admission. Weight up 4 pounds for the admission.   Inpatient Medications    Scheduled Meds: . albumin human  12.5 g Intravenous Daily  . aspirin EC  81 mg Oral Daily  . docusate sodium  100 mg Oral BID  . feeding supplement (ENSURE ENLIVE)  237 mL Oral TID BM  . ferrous gluconate  324 mg Oral BID WC  . heparin  5,000 Units Subcutaneous Q8H  . levothyroxine  25 mcg Oral QAC breakfast  . morphine  30 mg Oral TID WC  . potassium chloride  10 mEq Oral BID  . sodium chloride flush  3 mL Intravenous Q12H  . sodium chloride flush  3 mL Intravenous Q12H  . spironolactone  25 mg Oral Daily   Continuous Infusions: . furosemide (LASIX) infusion 8 mg/hr (06/07/16 1843)   PRN Meds: sodium chloride, acetaminophen **OR** acetaminophen, bisacodyl, HYDROcodone-acetaminophen, ondansetron **OR** ondansetron (ZOFRAN) IV, senna-docusate, sodium chloride, sodium chloride flush   Vital Signs    Vitals:   06/07/16 0528 06/07/16 1115 06/07/16 2053 06/08/16 0519  BP:  (!) 93/45 (!) 102/56 111/62  Pulse:  84 92 84  Resp:  18 18 18   Temp:  97.8 F (36.6 C) 97.8 F (36.6 C) 98.6 F (37 C)  TempSrc:  Oral Oral Oral  SpO2:  100% 96% 100%  Weight: 169 lb 1.6 oz (76.7 kg)   170 lb 14.4 oz (77.5 kg)  Height:        Intake/Output Summary (Last 24 hours) at 06/08/16 0744 Last data filed at 06/08/16 6578  Gross per 24 hour    Intake           1163.2 ml  Output             1050 ml  Net            113.2 ml   Filed Weights   06/06/16 0403 06/07/16 0528 06/08/16 0519  Weight: 169 lb 12.8 oz (77 kg) 169 lb 1.6 oz (76.7 kg) 170 lb 14.4 oz (77.5 kg)    Physical Exam    GEN: Well nourished, well developed, in no acute distress.  HEENT: Grossly normal.  Neck: Supple, JVD remains elevated to the jaw, no carotid bruits, or masses. Cardiac: RRR, III/VI systolic murmur, no rubs, or gallops. No clubbing, cyanosis, 2-3+ pitting LE edema to the sacrum.  Radials/DP/PT 2+ and equal bilaterally.  Respiratory:  Bibasilar crackles. GI: Soft, nontender, nondistended, BS + x 4. MS: no deformity or atrophy. Skin: warm and dry, no rash. Neuro:  Strength and sensation are intact. Psych: AAOx3.  Normal affect.  Labs    CBC  Recent Labs  06/07/16 0436 06/08/16 0614  WBC 30.9* 28.4*  HGB 12.7* 13.1  HCT 42.5 44.8  MCV 63.6* 65.3*  PLT 369 469   Basic Metabolic Panel  Recent Labs  06/07/16 0436 06/08/16 0614  NA 141 140  K 3.7 4.2  CL 97* 96*  CO2 35* 33*  GLUCOSE 131* 134*  BUN 67* 70*  CREATININE 1.72* 1.69*  CALCIUM 9.4 9.3   Liver Function Tests No results for input(s): AST, ALT, ALKPHOS, BILITOT, PROT, ALBUMIN in the last 72 hours. No results for input(s): LIPASE, AMYLASE in the last 72 hours. Cardiac Enzymes No results for input(s): CKTOTAL, CKMB, CKMBINDEX, TROPONINI in the last 72 hours. BNP Invalid input(s): POCBNP D-Dimer No results for input(s): DDIMER in the last 72 hours. Hemoglobin A1C No results for input(s): HGBA1C in the last 72 hours. Fasting Lipid Panel No results for input(s): CHOL, HDL, LDLCALC, TRIG, CHOLHDL, LDLDIRECT in the last 72 hours. Thyroid Function Tests No results for input(s): TSH, T4TOTAL, T3FREE, THYROIDAB in the last 72 hours.  Invalid input(s): FREET3  Telemetry    NSR, 70's bpm - Personally Reviewed  ECG    n/a - Personally Reviewed  Radiology    Dg  Chest 1 View  Result Date: 06/07/2016 CLINICAL DATA:  Pneumonia. History of hypertension, congestive heart failure and aortic stenosis. EXAM: CHEST 1 VIEW COMPARISON:  05/31/2016 and 01/12/2016 radiographs. FINDINGS: 1052 hour. The heart size and mediastinal contours are stable. There is aortic atherosclerosis. There is chronic vascular congestion with right-greater-than-left pleural thickening and mild basilar scarring. No overt pulmonary edema, confluent airspace opacity or pneumothorax. The bones appear unchanged. IMPRESSION: Stable examination with chronic vascular congestion and chronic pleural thickening. No acute findings. Electronically Signed   By: Richardean Sale M.D.   On: 06/07/2016 11:20    Cardiac Studies   2D Echocardiogram3.1.2018  Study Conclusions  - Left ventricle: The cavity size was normal. There was mild concentric hypertrophy. Systolic function was normal. The estimated ejection fraction was in the range of 50% to 55%. Wall motion was normal; there were no regional wall motion abnormalities. Features are consistent with a pseudonormal left ventricular filling pattern, with concomitant abnormal relaxation and increased filling pressure (grade 2 diastolic dysfunction). - Ventricular septum: The contour showed systolic flattening. These changes are consistent with RV pressure overload. - Aortic valve: There was moderate to severe stenosis. There was moderate regurgitation. Mean gradient (S): 22 mm Hg. Valve area (VTI): 0.91 cm^2. - Mitral valve: Calcified annulus. Mildly thickened, mildly calcified leaflets . There was mild regurgitation. - Left atrium: The atrium was mildly dilated. - Right atrium: The atrium was mildly dilated. - Pulmonary arteries: Systolic pressure was moderately increased. PA peak pressure: 55 mm Hg (S).  Patient Profile     81 y.o. male with history of diastolic CHF, mod to sev AS, mod AI, prior stroke, and HTN, who  was admitted 3/1 with progressive dyspnea, wt gain, and CHF.  Assessment & Plan    1. Acute on chronic diastolic CHF/anasarca/LE swelling: -Has responded well to IV diuresis, though still with marked volume overload with significant JVD elevation to the jaw, abdominal distension, and LE swelling to the sacrum -Will need another week of IV diuresis likely -Net -7.4 L for the admission  -Weight remains up, he is up 4 pounds for the admission per documented weights -Renal function stable with IV diuresis, bicarb slightly up today -Continue Lasix gtt at 8 mg/hr, consider titration to 10 mg/hr -Status post IV albumin -Would likely benefit from metolazone though his BP is soft -Dry weight of approximately 146 pounds in 02/2016  2. Acute on CKD stage III: -SCr stable  -Nephrology on board  3. Moderate to severe AS: -Likely contributing to his CHF -Will  need outpatient follow up, with consideration of TAVR clinic, though his CKD may make him a poor candidate for this -Uncertain if he would want AVR as in the past he has declined this  4. Elevated troponin: -Likely in the setting of #1 -No chest pain -Low-risk Myoview in 10/2015 -Echo this admission with normal EF -No further plans for ischemic evaluation at this time  5. Leukocytosis: -WBC count remains elevated -Was previously on vancomycin, though this has been discontinued -LE remain erythematous above UNNA boots -CXR on 3/7 with stable congestion  Signed, Christell Faith, PA-C [place] Pager: [phone] 06/08/2016, 7:44 AM

## 2016-06-08 NOTE — Progress Notes (Signed)
PAtient presently sitting in the recliner chair, remains  alert and oriented, denies any pain at this time, rounded with MD at bedside, patient and family updated about plan off care, will continue to monitor

## 2016-06-09 ENCOUNTER — Ambulatory Visit: Payer: Medicare Other | Admitting: Family

## 2016-06-09 LAB — BASIC METABOLIC PANEL
Anion gap: 11 (ref 5–15)
BUN: 79 mg/dL — AB (ref 6–20)
CHLORIDE: 93 mmol/L — AB (ref 101–111)
CO2: 34 mmol/L — AB (ref 22–32)
CREATININE: 1.88 mg/dL — AB (ref 0.61–1.24)
Calcium: 9.6 mg/dL (ref 8.9–10.3)
GFR calc non Af Amer: 33 mL/min — ABNORMAL LOW (ref >60)
GFR, EST AFRICAN AMERICAN: 38 mL/min — AB (ref >60)
Glucose, Bld: 105 mg/dL — ABNORMAL HIGH (ref 65–99)
POTASSIUM: 4.3 mmol/L (ref 3.5–5.1)
Sodium: 138 mmol/L (ref 135–145)

## 2016-06-09 LAB — CBC
HEMATOCRIT: 43.9 % (ref 40.0–52.0)
Hemoglobin: 12.9 g/dL — ABNORMAL LOW (ref 13.0–18.0)
MCH: 18.9 pg — AB (ref 26.0–34.0)
MCHC: 29.3 g/dL — ABNORMAL LOW (ref 32.0–36.0)
MCV: 64.4 fL — ABNORMAL LOW (ref 80.0–100.0)
Platelets: 384 10*3/uL (ref 150–440)
RBC: 6.81 MIL/uL — AB (ref 4.40–5.90)
RDW: 22 % — ABNORMAL HIGH (ref 11.5–14.5)
WBC: 26.5 10*3/uL — ABNORMAL HIGH (ref 3.8–10.6)

## 2016-06-09 LAB — CREATININE CLEARANCE, URINE, 24 HOUR
COLLECTION INTERVAL-CRCL: 24 h
CREATININE, URINE: 29 mg/dL
Creatinine Clearance: 31 mL/min — ABNORMAL LOW (ref 75–125)
Creatinine, 24H Ur: 841 mg/d (ref 800–2000)
Urine Total Volume-CRCL: 2900 mL

## 2016-06-09 NOTE — Progress Notes (Signed)
Physical Therapy Treatment Patient Details Name: Nathan Kramer MRN: [id number] DOB: Mar 08, 1936 Today's Date: 06/09/2016    History of Present Illness Nikhil Osei is a 81 y.o. male with a known history of congestive heart failure, chronic kidney disease, anemia, prostate cancer, hypertension, CVA presented to the emergency room with swelling in both the lower legs along with redness and weeping lesions in the lower extremities. Patient has serous sanguineous fluid weeping from the lower extremity skin.  He also complained of shortness of breath for the last couple of weeks. Has history of orthopnea. He was evaluated in the emergency room his BNP was elevated. He was given IV Lasix for diuresis and IV vancomycin and divided was started for lower extremity cellulitis. Troponin was elevated but patient also had elevation in creatinine. Elevated troponin could be secondary to demand ischemia and renal insufficiency. EKG normal sinus rhythm with no ST segment elevation. Patient was put on oxygen via nasal cannula in the emergency room. Hospitalist service was consulted for further care of the patient. Pt is now admitted for acute on chronic CHF with history of moderate/severe aortic stenosis. He is eager to work with physical therapy on this date.     PT Comments    Pt continues to remain motivated to work with therapy. He ambulates one lap around RN station with short shuffling steps. Unable to obtain SaO2 readings from either hand on this date however pt denies DOE and no signs of respiratory distress. HR remains WNL during ambulation as well. Pt provided cues to keep body inside walker for safety. He is able to complete seated exercises but continues to complain of R knee pain as well as pain on his tailbone. Spoke with RN who brought pt pain medication. Also notified RN that sacral dressing needs to be adjusted. Pt will benefit from skilled PT services to address deficits in strength, balance, and  mobility in order to return to full function at home.    Follow Up Recommendations  Home health PT     Equipment Recommendations  Rolling walker with 5" wheels    Recommendations for Other Services       Precautions / Restrictions Precautions Precautions: Fall Restrictions Weight Bearing Restrictions: No    Mobility  Bed Mobility               General bed mobility comments: Received upright in recliner and left in recliner. Bed mobility not assessed at this time  Transfers Overall transfer level: Needs assistance Equipment used: Rolling walker (2 wheeled) Transfers: Sit to/from Stand Sit to Stand: Supervision         General transfer comment: Pt demonstrates UE reliance for transfers but once upright is safe and steady on his feet in wide stance wtihout UE support  Ambulation/Gait Ambulation/Gait assistance: Min guard Ambulation Distance (Feet): 220 Feet Assistive device: Rolling walker (2 wheeled) Gait Pattern/deviations: Decreased step length - right;Decreased step length - left Gait velocity: Decreased but functional for full household mobility Gait velocity interpretation: <1.8 ft/sec, indicative of risk for recurrent falls General Gait Details: Pt able to ambulate a full lap around RN station with only 2 standing rest breaks on this date. Gait speed is slow but functional for household mobility. Utilized 3L/min O2 which is consistent with his current supplemental O2 in room. Unable to obtain SaO2 readings from either hand however pt denies DOE and no signs of respiratory distress. HR remains WNL during ambulation as well. Pt provided cues to keep body inside  walker for safety. He has a history of R eye blindness and requires infrequent redirection for safety due to bumping into objects with his walker   Stairs            Wheelchair Mobility    Modified Rankin (Stroke Patients Only)       Balance Overall balance assessment: Needs  assistance Sitting-balance support: No upper extremity supported Sitting balance-Leahy Scale: Good     Standing balance support: No upper extremity supported Standing balance-Leahy Scale: Fair                      Cognition Arousal/Alertness: Awake/alert Behavior During Therapy: WFL for tasks assessed/performed Overall Cognitive Status: Within Functional Limits for tasks assessed                      Exercises General Exercises - Lower Extremity Long Arc Quad: Strengthening;Both;Seated;15 reps Heel Slides: Strengthening;Seated;15 reps;Both Hip ABduction/ADduction: Strengthening;Both;Seated;15 reps Hip Flexion/Marching: Strengthening;Both;15 reps;Seated    General Comments        Pertinent Vitals/Pain Pain Assessment: 0-10 Pain Score: 8  Pain Location: Tailbone and RLE Pain Intervention(s): Monitored during session;Patient requesting pain meds-RN notified;RN gave pain meds during session    Home Living                      Prior Function            PT Goals (current goals can now be found in the care plan section) Acute Rehab PT Goals Patient Stated Goal: Return to prior level of function at home. "I want to live a long time." PT Goal Formulation: With patient Time For Goal Achievement: 06/19/16 Potential to Achieve Goals: Good    Frequency    Min 2X/week      PT Plan Current plan remains appropriate    Co-evaluation             End of Session Equipment Utilized During Treatment: Gait belt;Oxygen;Other (comment) (3 L/min) Activity Tolerance: Patient tolerated treatment well Patient left: in chair;with call bell/phone within reach;with chair alarm set Nurse Communication: Patient requests pain meds;Other (comment) (Needs new sacral dressings, requesting thickened liquids) PT Visit Diagnosis: Unsteadiness on feet (R26.81);Muscle weakness (generalized) (M62.81)     Time: 3845-3646 PT Time Calculation (min) (ACUTE ONLY): 30  min  Charges:  $Gait Training: 8-22 mins $Therapeutic Exercise: 8-22 mins                    G Codes:      Lyndel Safe Wilfred Dayrit PT, DPT   Nabiha Planck 06/09/2016, 11:15 AM

## 2016-06-09 NOTE — Progress Notes (Signed)
[facility] [place] NAME: Nathan Kramer    MR#:  [id number]  DATE OF BIRTH:  May 07, 1935  SUBJECTIVE:   Wife at bedside this am Patient still with edema .  REVIEW OF SYSTEMS:    Review of Systems  Constitutional: Negative for chills and fever.  HENT: Negative for congestion, nosebleeds and tinnitus.   Eyes: Negative for blurred vision and double vision.  Respiratory: Positive for shortness of breath (better). Negative for cough and wheezing.   Cardiovascular: Positive for leg swelling. Negative for chest pain, orthopnea and PND.  Gastrointestinal: Negative for abdominal pain, blood in stool, constipation, diarrhea, melena, nausea and vomiting.  Genitourinary: Negative for dysuria and hematuria.       Scrotal swelling  Neurological: Negative for dizziness, sensory change and focal weakness.  Psychiatric/Behavioral: Positive for memory loss. The patient is nervous/anxious.   All other systems reviewed and are negative.   Nutrition: heart Healthy Tolerating Diet: Yes Tolerating PT: Home health at discharge.    DRUG ALLERGIES:   Allergies  Allergen Reactions  . Amoxicillin     Other reaction(s): Localized superficial swelling of skin Face swelling   . Reglan [Metoclopramide]     Involuntary movements body    VITALS:  Blood pressure (!) 97/44, pulse 86, temperature 97.6 F (36.4 C), temperature source Oral, resp. rate 18, height 5\' 8"  (1.727 m), weight 77.6 kg (171 lb 1.6 oz), SpO2 100 %.  PHYSICAL EXAMINATION:   Physical Exam  GENERAL:  81 y.o.-year-old patient sitting up in chair in no acute distress.  EYES: Legally blind. No scleral icterus. Extraocular muscles intact.  HEENT: Head atraumatic, normocephalic. Oropharynx and nasopharynx clear.  JVD NECK:  Supple, no jugular venous distention. No thyroid enlargement, no tenderness.  LUNGS:Clear to auscultation no wheezing, bibasilar rales, no rhonchi. No use of accessory  muscles of respiration.  CARDIOVASCULAR: S1, S2 normal.3/6 SEM at LSB, No rubs, or gallops.  ABDOMEN: Soft, nontender, ND Bowel sounds present. No organomegaly or mass.  EXTREMITIES: No cyanosis, clubbing, + 3 edema b/l.    Legs wrapped in UNNA boots.  Still with significant scrotal swelling NEUROLOGIC: Cranial nerves II through XII are intact. No focal Motor or sensory deficits b/l.  PSYCHIATRIC: The patient is alert and oriented x 3.  SKIN: No obvious rash, lesion, or ulcer.    LABORATORY PANEL:   CBC  Recent Labs Lab 06/09/16 0414  WBC 26.5*  HGB 12.9*  HCT 43.9  PLT 384   ------------------------------------------------------------------------------------------------------------------  Chemistries   Recent Labs Lab 06/03/16 0524  06/09/16 0414  NA 137  < > 138  K 4.5  < > 4.3  CL 97*  < > 93*  CO2 27  < > 34*  GLUCOSE 110*  < > 105*  BUN 57*  < > 79*  CREATININE 1.98*  < > 1.88*  CALCIUM 8.8*  < > 9.6  AST 42*  --   --   ALT 11*  --   --   ALKPHOS 120  --   --   BILITOT 1.0  --   --   < > = values in this interval not displayed. ------------------------------------------------------------------------------------------------------------------  Cardiac Enzymes No results for input(s): TROPONINI in the last 168 hours. ------------------------------------------------------------------------------------------------------------------  RADIOLOGY:  Dg Chest 1 View  Result Date: 06/07/2016 CLINICAL DATA:  Pneumonia. History of hypertension, congestive heart failure and aortic stenosis. EXAM: CHEST 1 VIEW COMPARISON:  05/31/2016 and 01/12/2016 radiographs. FINDINGS: 1052 hour. The heart  size and mediastinal contours are stable. There is aortic atherosclerosis. There is chronic vascular congestion with right-greater-than-left pleural thickening and mild basilar scarring. No overt pulmonary edema, confluent airspace opacity or pneumothorax. The bones appear unchanged.  IMPRESSION: Stable examination with chronic vascular congestion and chronic pleural thickening. No acute findings. Electronically Signed   By: Richardean Sale M.D.   On: 06/07/2016 11:20     ASSESSMENT AND PLAN:   81 year old male with past medical history of hypertension, pulmonary hypertension, diastolic CHF, moderate aortic stenosis, history of GERD, prostate cancer who presented to the hospital due to shortness of breath and worsening lower extremity edema.  1.Acute on chronic diastolic dysfunction with underlying pulmonary hypertension: Still with significant edema and signs of fluid overload Continue Lasix drip with albumin as per nephrology Continue to Monitor BMP Appreciate Cards/Nephro input.  Follow I's and O's and daily weights.  2. Moderate/Severe  Aortic Stenosis contributing to CHF: As per Cardiology, he is not a great surgical candidate.   Repeat Echo shows no new changes.   He will follow up with Cardiology as outpatient.   3. CKD Stage III: Creatinine remains stable Continue to monitor [facility] Nephrology consult appreciated.   4. Leukocytosis - unclear etiology of leukocytosis. Patient is without fever. Urine analysis and chest x-ray was negative for infection. Continue to monitor CBC  5. Hypothyroidism: Continue Synthroid.  6. LE edema due to CHF and venous stasis.  Unna boots and Lasix   7. Elevated troponin due to demand ischemia and not ACS  8. Nose bleeds, resolved: Nasal saline spray  9. Constipation: Continue daily  stool softeners  Management plans discussed with the patient and wife and he is in agreement CODE STATUS: DNR  DVT Prophylaxis: Hep SQ  TOTAL TIME TAKING CARE OF THIS PATIENT: 79 minutes.   POSSIBLE D/C IN 5 DAYS, DEPENDING ON CLINICAL CONDITION.   Nathan Kramer M.D on 06/09/2016 at 8:07 AM  Between 7am to 6pm - Pager - [phone]  After 6pm go to [email or web address] - [employer] [facility]  Office   [phone]  CC: Primary care physician; Tracie Harrier, MD

## 2016-06-09 NOTE — Progress Notes (Signed)
Patient Name: Nathan Kramer Date of Encounter: 06/09/2016  Primary Cardiologist: [facility] Problem List     Principal Problem:   Acute on chronic diastolic CHF (congestive heart failure), NYHA class 3 (HCC) Active Problems:   Aortic stenosis, moderate   Acute kidney injury superimposed on CKD (HCC)   Dietary noncompliance   HTN (hypertension)   Cellulitis     Subjective   No SOB. LE edema remains, though he and his wife feel like it has improved some. Went up on Lasix gtt to 10 mg/hr with a one-time metolazone on 3/8 with UOP of 3.1 L for the past 24 hours. Renal function slightly up today at 1.8 from 1.6 the prior day. Weight up 5 pounds for the admission.   Inpatient Medications    Scheduled Meds: . albumin human  12.5 g Intravenous Daily  . aspirin EC  81 mg Oral Daily  . docusate sodium  100 mg Oral BID  . feeding supplement (ENSURE ENLIVE)  237 mL Oral TID BM  . ferrous gluconate  324 mg Oral BID WC  . heparin  5,000 Units Subcutaneous Q8H  . levothyroxine  25 mcg Oral QAC breakfast  . morphine  30 mg Oral TID WC  . polyethylene glycol  17 g Oral Daily  . potassium chloride  10 mEq Oral BID  . sodium chloride flush  3 mL Intravenous Q12H  . spironolactone  25 mg Oral Daily   Continuous Infusions: . furosemide (LASIX) infusion 10 mg/hr (06/09/16 0003)   PRN Meds: sodium chloride, acetaminophen **OR** acetaminophen, alum & mag hydroxide-simeth, bisacodyl, HYDROcodone-acetaminophen, ondansetron **OR** ondansetron (ZOFRAN) IV, senna-docusate, sodium chloride, sodium chloride flush   Vital Signs    Vitals:   06/08/16 2039 06/09/16 0335 06/09/16 0357 06/09/16 0734  BP: (!) 96/47 106/77  (!) 97/44  Pulse: 89 86  86  Resp: 18     Temp: 97.7 F (36.5 C) 97.6 F (36.4 C)  97.6 F (36.4 C)  TempSrc:  Oral  Oral  SpO2: 98% 100%  100%  Weight:   171 lb 1.6 oz (77.6 kg)   Height:        Intake/Output Summary (Last 24 hours) at 06/09/16 0806 Last data  filed at 06/09/16 0455  Gross per 24 hour  Intake             2310 ml  Output             3100 ml  Net             -790 ml   Filed Weights   06/07/16 0528 06/08/16 0519 06/09/16 0357  Weight: 169 lb 1.6 oz (76.7 kg) 170 lb 14.4 oz (77.5 kg) 171 lb 1.6 oz (77.6 kg)    Physical Exam    GEN: Well nourished, well developed, in no acute distress.  HEENT: Grossly normal.  Neck: Supple, JVD elevated to just below the jaw, no carotid bruits, or masses. Cardiac: RRR, III/VI systolic murmur, no rubs, or gallops. No clubbing, cyanosis, 2+ pitting edema to the sacrum.  Radials/DP/PT 2+ and equal bilaterally. UNNA boots noted with mild erythema extending superiorly from [employer].  Respiratory:  Improved bibasilar crackles. GI: Soft, nontender, nondistended, BS + x 4. MS: no deformity or atrophy. Skin: warm and dry, no rash. Neuro:  Strength and sensation are intact. Psych: AAOx3.  Normal affect.  Labs    CBC  Recent Labs  06/08/16 0614 06/09/16 0414  WBC 28.4* 26.5*  HGB 13.1 12.9*  HCT 44.8 43.9  MCV 65.3* 64.4*  PLT 363 782   Basic Metabolic Panel  Recent Labs  06/08/16 0614 06/09/16 0414  NA 140 138  K 4.2 4.3  CL 96* 93*  CO2 33* 34*  GLUCOSE 134* 105*  BUN 70* 79*  CREATININE 1.69* 1.88*  CALCIUM 9.3 9.6   Liver Function Tests No results for input(s): AST, ALT, ALKPHOS, BILITOT, PROT, ALBUMIN in the last 72 hours. No results for input(s): LIPASE, AMYLASE in the last 72 hours. Cardiac Enzymes No results for input(s): CKTOTAL, CKMB, CKMBINDEX, TROPONINI in the last 72 hours. BNP Invalid input(s): POCBNP D-Dimer No results for input(s): DDIMER in the last 72 hours. Hemoglobin A1C No results for input(s): HGBA1C in the last 72 hours. Fasting Lipid Panel No results for input(s): CHOL, HDL, LDLCALC, TRIG, CHOLHDL, LDLDIRECT in the last 72 hours. Thyroid Function Tests No results for input(s): TSH, T4TOTAL, T3FREE, THYROIDAB in the last 72 hours.  Invalid  input(s): FREET3  Telemetry    NSR, 70's bpm - Personally Reviewed  ECG    n/a - Personally Reviewed  Radiology    Dg Chest 1 View  Result Date: 06/07/2016 CLINICAL DATA:  Pneumonia. History of hypertension, congestive heart failure and aortic stenosis. EXAM: CHEST 1 VIEW COMPARISON:  05/31/2016 and 01/12/2016 radiographs. FINDINGS: 1052 hour. The heart size and mediastinal contours are stable. There is aortic atherosclerosis. There is chronic vascular congestion with right-greater-than-left pleural thickening and mild basilar scarring. No overt pulmonary edema, confluent airspace opacity or pneumothorax. The bones appear unchanged. IMPRESSION: Stable examination with chronic vascular congestion and chronic pleural thickening. No acute findings. Electronically Signed   By: Richardean Sale M.D.   On: 06/07/2016 11:20    Cardiac Studies   2D Echocardiogram3.1.2018  Study Conclusions  - Left ventricle: The cavity size was normal. There was mild concentric hypertrophy. Systolic function was normal. The estimated ejection fraction was in the range of 50% to 55%. Wall motion was normal; there were no regional wall motion abnormalities. Features are consistent with a pseudonormal left ventricular filling pattern, with concomitant abnormal relaxation and increased filling pressure (grade 2 diastolic dysfunction). - Ventricular septum: The contour showed systolic flattening. These changes are consistent with RV pressure overload. - Aortic valve: There was moderate to severe stenosis. There was moderate regurgitation. Mean gradient (S): 22 mm Hg. Valve area (VTI): 0.91 cm^2. - Mitral valve: Calcified annulus. Mildly thickened, mildly calcified leaflets . There was mild regurgitation. - Left atrium: The atrium was mildly dilated. - Right atrium: The atrium was mildly dilated. - Pulmonary arteries: Systolic pressure was moderately increased. PA peak pressure: 55 mm  Hg (S).  Patient Profile     81 y.o. male with history of diastolic CHF, mod to sev AS, mod AI, prior stroke, and HTN, who was admitted 3/1 with progressive dyspnea, wt gain, and CHF.  Assessment & Plan    1. Acute on chronic diastolic CHF/anasarca/LE swelling: -Has responded well to IV diuresis, though still with marked volume overload with significant JVD elevation to just below the jaw, abdominal distension, and LE swelling to the sacrum -Will need another week of IV diuresis likely -Net -8.1 L for the admission  -Weight remains up, he is up 5 pounds for the admission per documented weights as of 3/9 -Increased Lasix gtt to 10 mg/hr with a one-time dose of metolazone on 3/8 with good UOP -Renal function slightly up this morning -Will continue Lasix gtt at 10 mg/hr -  Hold metolazone -Status post IV albumin -Dry weight of approximately 146 pounds in 02/2016  2. Acute on CKD stage III: -SCr stable, slight bump today s/p metolazone -Will hold metolazone today -Continue Lasix gtt as above -Nephrology on board  3. Moderate to severe AS: -Likely contributing to his CHF -Will need outpatient follow up, with consideration of TAVR clinic, though his CKD may make him a poor candidate for this -Uncertain if he would want AVR as in the past he has declined this  4. Elevated troponin: -Likely in the setting of #1 -No chest pain -Low-risk Myoview in 10/2015 -Echo this admission with normal EF -No further plans for ischemic evaluation at this time  5. Leukocytosis: -WBC count remains elevated, improving -Was previously on vancomycin, though this has been discontinued -LE remain erythematous above UNNA boots -CXR on 3/7 with stable congestion  Signed, Christell Faith, PA-C [place] Pager: [phone] 06/09/2016, 8:06 AM

## 2016-06-09 NOTE — Progress Notes (Signed)
Noted urine in the foley to be tea color with sediments. Patient has Heparin SQ to be administered this shift.  Dr. Jannifer Franklin notified with a new order to collect U/A and hold the heparin SQ till the result comes. No acute distress noted. Will continue to monitor.

## 2016-06-09 NOTE — Plan of Care (Signed)
Problem: Pain Managment: Goal: General experience of comfort will improve Outcome: Progressing Patient complaining of 6/10 pain in his buttock and right lower leg. Medicated with PRN Norco, pain level went down to 4/10. Placed warm towels on elevated legs, per patient's request. Will reassess pain level and continue to manage appropriately.

## 2016-06-10 ENCOUNTER — Inpatient Hospital Stay: Payer: Medicare Other

## 2016-06-10 LAB — CBC
HCT: 43 % (ref 40.0–52.0)
Hemoglobin: 12.7 g/dL — ABNORMAL LOW (ref 13.0–18.0)
MCH: 19 pg — AB (ref 26.0–34.0)
MCHC: 29.6 g/dL — ABNORMAL LOW (ref 32.0–36.0)
MCV: 64.3 fL — ABNORMAL LOW (ref 80.0–100.0)
PLATELETS: 382 10*3/uL (ref 150–440)
RBC: 6.69 MIL/uL — AB (ref 4.40–5.90)
RDW: 22.5 % — AB (ref 11.5–14.5)
WBC: 26.9 10*3/uL — ABNORMAL HIGH (ref 3.8–10.6)

## 2016-06-10 LAB — BASIC METABOLIC PANEL
Anion gap: 12 (ref 5–15)
BUN: 81 mg/dL — AB (ref 6–20)
CALCIUM: 9.5 mg/dL (ref 8.9–10.3)
CO2: 37 mmol/L — ABNORMAL HIGH (ref 22–32)
Chloride: 90 mmol/L — ABNORMAL LOW (ref 101–111)
Creatinine, Ser: 1.69 mg/dL — ABNORMAL HIGH (ref 0.61–1.24)
GFR calc Af Amer: 43 mL/min — ABNORMAL LOW (ref >60)
GFR, EST NON AFRICAN AMERICAN: 37 mL/min — AB (ref >60)
Glucose, Bld: 120 mg/dL — ABNORMAL HIGH (ref 65–99)
POTASSIUM: 4.2 mmol/L (ref 3.5–5.1)
SODIUM: 139 mmol/L (ref 135–145)

## 2016-06-10 LAB — URINALYSIS, COMPLETE (UACMP) WITH MICROSCOPIC

## 2016-06-10 LAB — GLUCOSE, CAPILLARY: GLUCOSE-CAPILLARY: 116 mg/dL — AB (ref 65–99)

## 2016-06-10 MED ORDER — HYDROMORPHONE HCL 1 MG/ML IJ SOLN
1.0000 mg | Freq: Once | INTRAMUSCULAR | Status: AC
Start: 2016-06-10 — End: 2016-06-10
  Administered 2016-06-10: 1 mg via INTRAVENOUS
  Filled 2016-06-10: qty 1

## 2016-06-10 MED ORDER — DEXTROSE 5 % IV SOLN
1.0000 g | INTRAVENOUS | Status: AC
  Administered 2016-06-10 – 2016-06-13 (×5): 1 g via INTRAVENOUS
  Filled 2016-06-10 (×5): qty 10

## 2016-06-10 NOTE — Progress Notes (Addendum)
Patient complaining of severe pain in penis/abdomen. CBI not flowing continuously. Foley irrigated, multiple large blood clots removed. Dr. Alyson Ingles made aware, new orders to bladder scan patient and irrigate foley as needed. CBI now flowing continuously and foley draining adequately. Will continue to frequently monitor.

## 2016-06-10 NOTE — Plan of Care (Signed)
Problem: Safety: Goal: Ability to remain free from injury will improve Outcome: Progressing Patient high fall risk. Safety precautions in place, including chair alarm. Reminded patient that he needs to call for assistance before ambulating, verbalized understanding.

## 2016-06-10 NOTE — Progress Notes (Signed)
ANTIBIOTIC CONSULT NOTE - INITIAL  Pharmacy Consult for Ceftriaxone Indication: UTI  Allergies  Allergen Reactions  . Amoxicillin     Other reaction(s): Localized superficial swelling of skin Face swelling   . Reglan [Metoclopramide]     Involuntary movements body    Patient Measurements: Height: 5\' 8"  (172.7 cm) Weight: 170 lb 3.2 oz (77.2 kg) IBW/kg (Calculated) : 68.4 Adjusted Body Weight:   Vital Signs: Temp: 97.4 F (36.3 C) (03/10 1126) Temp Source: Oral (03/10 1126) BP: 107/68 (03/10 1126) Pulse Rate: 87 (03/10 1126) Intake/Output from previous day: 03/09 0701 - 03/10 0700 In: 890 [P.O.:600; I.V.:240; IV Piggyback:50] Out: 2800 [Urine:2800] Intake/Output from this shift: Total I/O In: 3000 [Other:3000] Out: 1150 [Urine:1150]  Labs:  Recent Labs  06/08/16 0614 06/08/16 0829 06/09/16 0414 06/10/16 0535  WBC 28.4*  --  26.5* 26.9*  HGB 13.1  --  12.9* 12.7*  PLT 363  --  384 382  LABCREA  --  29  --   --   CREATININE 1.69*  --  1.88* 1.69*   Estimated Creatinine Clearance: 34.9 mL/min (by C-G formula based on SCr of 1.69 mg/dL (H)). No results for input(s): VANCOTROUGH, VANCOPEAK, VANCORANDOM, GENTTROUGH, GENTPEAK, GENTRANDOM, TOBRATROUGH, TOBRAPEAK, TOBRARND, AMIKACINPEAK, AMIKACINTROU, AMIKACIN in the last 72 hours.   Microbiology: Recent Results (from the past 720 hour(s))  Blood culture (routine x 2)     Status: None   Collection Time: 06/01/16 12:20 AM  Result Value Ref Range Status   Specimen Description BLOOD  L WRIST  Final   Special Requests BOTTLES DRAWN AEROBIC AND ANAEROBIC  ADEQUATE   Final   Culture NO GROWTH 5 DAYS  Final   Report Status 06/06/2016 FINAL  Final  Blood culture (routine x 2)     Status: None   Collection Time: 06/01/16  1:11 AM  Result Value Ref Range Status   Specimen Description BLOOD  R FOREARM  Final   Special Requests BOTTLES DRAWN AEROBIC AND ANAEROBIC  ADEQUATE  Final   Culture NO GROWTH 5 DAYS  Final   Report  Status 06/06/2016 FINAL  Final    Medical History: Past Medical History:  Diagnosis Date  . Anemia   . Aortic insufficiency   . Aortic stenosis   . Chronic diastolic CHF (congestive heart failure) ([place]) 01/2016  . Mitral regurgitation   . Other chronic pain   . Prostate cancer ([place])   . Reflux esophagitis   . Stroke ([place])   . Unspecified essential hypertension   . Unspecified visual loss     Medications:  Scheduled:  . albumin human  12.5 g Intravenous Daily  . aspirin EC  81 mg Oral Daily  . cefTRIAXone (ROCEPHIN) IVPB 1 gram/50 mL D5W  1 g Intravenous Q24H  . docusate sodium  100 mg Oral BID  . feeding supplement (ENSURE ENLIVE)  237 mL Oral TID BM  . ferrous gluconate  324 mg Oral BID WC  . heparin  5,000 Units Subcutaneous Q8H  . levothyroxine  25 mcg Oral QAC breakfast  . morphine  30 mg Oral TID WC  . polyethylene glycol  17 g Oral Daily  . potassium chloride  10 mEq Oral BID  . sodium chloride flush  3 mL Intravenous Q12H  . spironolactone  25 mg Oral Daily   Assessment: CrCl = 34.9 ml/min   Goal of Therapy:  resolution of infection  Plan:  Expected duration 7 days with resolution of temperature and/or normalization of WBC  Ceftriaxone 1 gm IV Q24H ordered to start 3/10.   Jelani Trueba D 06/10/2016,7:20 PM

## 2016-06-10 NOTE — Progress Notes (Signed)
[facility]  ROUNDING NOTE   Subjective:   Patient seen earlier today Lasix drip continued at 10 mg/hr UOP 890 cc. Foley removed this morning  No SOB Still continues to have significant amount of edema  Objective:  Vital signs in last 24 hours:  Temp:  [97.4 F (36.3 C)-98 F (36.7 C)] 97.4 F (36.3 C) (03/10 1126) Pulse Rate:  [81-91] 87 (03/10 1126) Resp:  [16-18] 18 (03/10 1126) BP: (106-113)/(47-68) 107/68 (03/10 1126) SpO2:  [93 %-100 %] 98 % (03/10 1126) Weight:  [77.2 kg (170 lb 3.2 oz)] 77.2 kg (170 lb 3.2 oz) (03/10 0405)  Weight change: -0.408 kg (-14.4 oz) Filed Weights   06/08/16 0519 06/09/16 0357 06/10/16 0405  Weight: 77.5 kg (170 lb 14.4 oz) 77.6 kg (171 lb 1.6 oz) 77.2 kg (170 lb 3.2 oz)    Intake/Output: I/O last 3 completed shifts: In: 25 [P.O.:970; I.V.:360; IV Piggyback:50] Out: 5000 [Urine:5000]   Intake/Output this shift:  Total I/O In: 180 [P.O.:120; I.V.:10; IV Piggyback:50] Out: 825 [Urine:825]  Physical Exam: General: NAD, sitting in chair  Head: Moist oral mucosal membranes, reddish tongue  Eyes: Anicteric,  Neck: Supple, trachea midline, +JVD  Lungs:  Bilateral wheezes  Heart: +3/6 crescendo murmur, irregular rhythm  Abdomen:  Soft, nontender, Distended, abdominal wall edema ++  Extremities: UNNA Boots bilaterally, 2+ peripheral edema upto lower edema  Neurologic: Nonfocal, moving all four extremities        Penile edema    Basic Metabolic Panel:  Recent Labs Lab 06/04/16 0444  06/06/16 0516 06/07/16 0436 06/08/16 0614 06/09/16 0414 06/10/16 0535  NA 139  < > 142 141 140 138 139  K 3.7  < > 3.7 3.7 4.2 4.3 4.2  CL 98*  < > 97* 97* 96* 93* 90*  CO2 30  < > 34* 35* 33* 34* 37*  GLUCOSE 101*  < > 151* 131* 134* 105* 120*  BUN 63*  < > 64* 67* 70* 79* 81*  CREATININE 1.83*  < > 1.70* 1.72* 1.69* 1.88* 1.69*  CALCIUM 9.0  < > 9.4 9.4 9.3 9.6 9.5  PHOS 3.4  --   --   --   --   --   --   < > = values in this  interval not displayed.  Liver Function Tests:  Recent Labs Lab 06/04/16 0444  ALBUMIN 3.6   No results for input(s): LIPASE, AMYLASE in the last 168 hours. No results for input(s): AMMONIA in the last 168 hours.  CBC:  Recent Labs Lab 06/06/16 0516 06/07/16 0436 06/08/16 0614 06/09/16 0414 06/10/16 0535  WBC 24.2* 30.9* 28.4* 26.5* 26.9*  HGB 13.0 12.7* 13.1 12.9* 12.7*  HCT 44.3 42.5 44.8 43.9 43.0  MCV 65.4* 63.6* 65.3* 64.4* 64.3*  PLT 330 369 363 384 382    Cardiac Enzymes: No results for input(s): CKTOTAL, CKMB, CKMBINDEX, TROPONINI in the last 168 hours.  BNP: Invalid input(s): POCBNP  CBG:  Recent Labs Lab 06/07/16 0748 06/10/16 0804  GLUCAP 104* 116*    Microbiology: Results for orders placed or performed during the hospital encounter of 05/31/16  Blood culture (routine x 2)     Status: None   Collection Time: 06/01/16 12:20 AM  Result Value Ref Range Status   Specimen Description BLOOD  L WRIST  Final   Special Requests BOTTLES DRAWN AEROBIC AND ANAEROBIC  ADEQUATE   Final   Culture NO GROWTH 5 DAYS  Final   Report Status 06/06/2016 FINAL  Final  Blood culture (routine x 2)     Status: None   Collection Time: 06/01/16  1:11 AM  Result Value Ref Range Status   Specimen Description BLOOD  R FOREARM  Final   Special Requests BOTTLES DRAWN AEROBIC AND ANAEROBIC  ADEQUATE  Final   Culture NO GROWTH 5 DAYS  Final   Report Status 06/06/2016 FINAL  Final    Coagulation Studies: No results for input(s): LABPROT, INR in the last 72 hours.  Urinalysis:  Recent Labs  06/09/16 2330  PROTEINUR TEST NOT REPORTED DUE TO COLOR INTERFERENCE OF URINE PIGMENT*  NITRITE TEST NOT REPORTED DUE TO COLOR INTERFERENCE OF URINE PIGMENT*  LEUKOCYTESUR TEST NOT REPORTED DUE TO COLOR INTERFERENCE OF URINE PIGMENT*      Imaging: Ct Abdomen Pelvis Wo Contrast  Result Date: 06/10/2016 CLINICAL DATA:  Catheter removed, now with hematuria EXAM: CT ABDOMEN AND  PELVIS WITHOUT CONTRAST TECHNIQUE: Multidetector CT imaging of the abdomen and pelvis was performed following the standard protocol without IV contrast. COMPARISON:  CT abdomen pelvis - 01/11/2016 FINDINGS: The lack of intravenous contrast limits the ability to evaluate solid abdominal organs. Lower chest: Evaluation of lower thorax is degraded secondary to patient respiratory artifact. Limited visualization of the lower thorax demonstrates trace/small bilateral effusions with associated subsegmental atelectasis within the imaged bilateral lung bases. Normal heart size. Coronary artery calcifications. Calcifications within the aortic valve leaflets and mitral valve annulus. Small amount of pericardial fluid, presumably physiologic. Hepatobiliary: Normal hepatic contour. Punctate calcification with the caudate, likely the sequela of prior glomus infection. Normal noncontrast appearance of the gallbladder given degree distention. No radiopaque gallstones. Trace amount of intra-abdominal ascites. Pancreas: Largely fatty replacement of the pancreas. Spleen: Splenomegaly with the spleen measuring 18 cm in length, similar to the 01/2016 examination. Adrenals/Urinary Tract: Bilateral nonobstructing nephrolithiasis with dominant right-sided nonobstructing renal stone within the posterior interpolar aspect the right kidney measuring 1 cm in diameter (53, series 2 and index nonobstructing stone within the inferior pole the left kidney measuring 0.5 cm (image 60, series 2). No renal stones are seen along expected course of either ureter or the urinary bladder. Normal noncontrast appearance the urinary bladder given underdistention. Multiple bilateral predominately exophytic hypoattenuating renal lesions are seen with index exophytic lesion arising from the anterior inferior aspect the left kidney measuring 4.9 cm in diameter. Renal lesions are incompletely characterized on this noncontrast examination though appear  morphologically similar to the 01/2016 examination are favored to represent renal cysts. Normal noncontrast appearance of the bilateral adrenal glands. Stomach/Bowel: Moderate colonic stool burden without evidence of enteric obstruction. Normal noncontrast appearance of the terminal ileum and retrocecal appendix. No pneumoperitoneum, pneumatosis or portal venous gas. Vascular/Lymphatic: Moderate amount of eccentric calcified plaque within a tortuous but normal caliber abdominal aorta. Scattered retroperitoneal and mesenteric lymph nodes are numerous though individually not enlarged by size criteria. No definitive bulky retroperitoneal, mesenteric, pelvic or inguinal lymphadenopathy on this noncontrast examination. Reproductive: Dystrophic calcifications within normal sized prostate gland. Small amount of fluid within the pelvic cul-de-sac. Scrotal edema. Other: Large amount of body wall subcutaneous edema. Musculoskeletal: No acute or aggressive osseous abnormalities. Mild-to-moderate multilevel lumbar spine DDD, worse at L2-L3 with disc space height loss, endplate irregularity and sclerosis. Stigmata of DISH with the caudal aspect of the thoracic spine. IMPRESSION: 1. No explanation for patient's hematuria. Specifically, normal noncontrast appearance of the urinary bladder given degree of distention. No evidence of urinary obstruction. 2. Small bilateral effusions, small amount of intra-abdominal ascites and  a large amount of body wall subcutaneous edema, findings are nonspecific though favored to represent congestive heart failure. Clinical correlation is advised. 3. Similar findings of splenomegaly without definitive morphologic changes of cirrhosis on this noncontrast exam. 4. Bilateral nonobstructing nephrolithiasis. 5. Grossly unchanged bilateral hypoattenuating renal lesions, incompletely characterized on this noncontrast examination though morphologically similar to the 01/2016 exam and favored to represent  renal cysts. 6. Coronary artery calcifications. Aortic Atherosclerosis (ICD10-170.0) Electronically Signed   By: Sandi Mariscal M.D.   On: 06/10/2016 11:04     Medications:   . furosemide (LASIX) infusion 10 mg/hr (06/10/16 0018)   . albumin human  12.5 g Intravenous Daily  . aspirin EC  81 mg Oral Daily  . docusate sodium  100 mg Oral BID  . feeding supplement (ENSURE ENLIVE)  237 mL Oral TID BM  . ferrous gluconate  324 mg Oral BID WC  . heparin  5,000 Units Subcutaneous Q8H  . levothyroxine  25 mcg Oral QAC breakfast  . morphine  30 mg Oral TID WC  . polyethylene glycol  17 g Oral Daily  . potassium chloride  10 mEq Oral BID  . sodium chloride flush  3 mL Intravenous Q12H  . spironolactone  25 mg Oral Daily   sodium chloride, acetaminophen **OR** acetaminophen, alum & mag hydroxide-simeth, bisacodyl, HYDROcodone-acetaminophen, ondansetron **OR** ondansetron (ZOFRAN) IV, senna-docusate, sodium chloride, sodium chloride flush  Assessment/ Plan:  [name] is a 81 y.o. white male with diastolic congestive heart failure, hypertension, CVA, BPH, , who was admitted to [facility] on 05/31/2016   1. Acute renal failure on chronic kidney disease stage III: with proteinuria, anasarca.  Chronic kidney disease secondary to hypertension. Acute renal failure from acute cardiorenal syndrome from diastolic congestive heart failure.  - furosemide gtt  At 10 mg/hr - will resume iv albumin for oncotic support  2.  Anasarca; Cardiac - aortic stenosis. Gr 2 diastolic dysfunction - UNNA boots - low salt diet - fluid restriction - Furosemide gtt - continue spironolactone.  - NO Cirrhosis on CT examination, but splenomegaly noted      LOS: 9 Stefano Trulson 3/10/201811:44 AM

## 2016-06-10 NOTE — Progress Notes (Signed)
Attempted to insert three-way foley catheter without success. Dr. Benjie Karvonen made aware. Consult to Urology in place, will notify Dr. Noah Delaine. Urology cart is at the bedside. Will continue to monitor.

## 2016-06-10 NOTE — Progress Notes (Addendum)
Nathan Kramer at [place] NAME: Nathan Kramer    MR#:  [id number]  DATE OF BIRTH:  Sep 08, 1935  SUBJECTIVE:   Wants to go home  REVIEW OF SYSTEMS:    Review of Systems  Constitutional: Negative for chills and fever.  HENT: Negative for congestion, nosebleeds and tinnitus.   Eyes: Negative for blurred vision and double vision.  Respiratory: Negative for cough, shortness of breath (better) and wheezing.   Cardiovascular: Positive for leg swelling. Negative for chest pain, orthopnea and PND.  Gastrointestinal: Negative for abdominal pain, blood in stool, constipation, diarrhea, melena, nausea and vomiting.  Genitourinary: Negative for dysuria and hematuria.       Scrotal swelling  Neurological: Negative for dizziness, sensory change and focal weakness.  Psychiatric/Behavioral: Negative for hallucinations, memory loss and suicidal ideas. The patient is not nervous/anxious.   All other systems reviewed and are negative.   Nutrition: heart Healthy Tolerating Diet: Yes Tolerating PT: Home health at discharge.    DRUG ALLERGIES:   Allergies  Allergen Reactions  . Amoxicillin     Other reaction(s): Localized superficial swelling of skin Face swelling   . Reglan [Metoclopramide]     Involuntary movements body    VITALS:  Blood pressure (!) 110/47, pulse 81, temperature 98 F (36.7 C), resp. rate 16, height 5\' 8"  (1.727 m), weight 77.2 kg (170 lb 3.2 oz), SpO2 93 %.  PHYSICAL EXAMINATION:   Physical Exam  GENERAL:  81 y.o.-year-old patient sitting up in chair in no acute distress.  EYES: Legally blind. No scleral icterus. Extraocular muscles intact.  HEENT: Head atraumatic, normocephalic. Oropharynx and nasopharynx clear.  +JVD NECK:  Supple, no jugular venous distention. No thyroid enlargement, no tenderness.  LUNGS:Clear to auscultation no wheezing, bibasilar rales, no rhonchi. No use of accessory muscles of respiration.   CARDIOVASCULAR: S1, S2 normal.3/6 SEM at LSB, No rubs, or gallops.  ABDOMEN: Soft, nontender, ND Bowel sounds present. No organomegaly or mass.  EXTREMITIES: No cyanosis, clubbing, + 3 edema b/l up to scrotum.    Legs wrapped in UNNA boots.  Still with significant scrotal swelling NEUROLOGIC: Cranial nerves II through XII are intact. No focal Motor or sensory deficits b/l.  PSYCHIATRIC: The patient is alert and oriented x 3.  SKIN: No obvious rash, lesion,  LABORATORY PANEL:   CBC  Recent Labs Lab 06/10/16 0535  WBC 26.9*  HGB 12.7*  HCT 43.0  PLT 382   ------------------------------------------------------------------------------------------------------------------  Chemistries   Recent Labs Lab 06/10/16 0535  NA 139  K 4.2  CL 90*  CO2 37*  GLUCOSE 120*  BUN 81*  CREATININE 1.69*  CALCIUM 9.5   ------------------------------------------------------------------------------------------------------------------  Cardiac Enzymes No results for input(s): TROPONINI in the last 168 hours. ------------------------------------------------------------------------------------------------------------------  RADIOLOGY:  No results found.   ASSESSMENT AND PLAN:   81 year old male with past medical history of hypertension, pulmonary hypertension, diastolic CHF, moderate aortic stenosis, history of GERD, prostate cancer who presented to the hospital due to shortness of breath and worsening lower extremity edema.  1.Acute on chronic diastolic dysfunction with underlying pulmonary hypertension/anasarca: Still with significant edema and signs of fluid overload Continue Lasix drip with albumin as per nephrology Continue to Monitor BMP Appreciate Cards/Nephro input.  Follow I's and O's and daily weights. Patient responding to increased dose of Lasix   2. Moderate/Severe  Aortic Stenosis contributing to CHF: As per Cardiology, he is a poor surgical candidate.   Repeat Echo  shows no new changes.  He will follow up with Cardiology as outpatient.   3. CKD Stage III: Creatinine remains stable Continue to monitor [facility] Nephrology consult appreciated.   4. Leukocytosis - unclear etiology of leukocytosis.  Patient is without fever.  Urine analysis and chest x-ray was negative for infection. Continue to monitor CBC  5. Hypothyroidism: Continue Synthroid.  6. LE edema due to CHF and venous stasis.  Unna boots and Lasix  Order CT noncontrast abdomen/pelvis given extensive LEE    7. Elevated troponin due to demand ischemia and not ACS  8. Nose bleeds, resolved: Nasal saline spray  9. Constipation: Continue daily stool softeners  Management plans discussed with the patient and wife and he is in agreement CODE STATUS: DNR  DVT Prophylaxis: Hep SQ  TOTAL TIME TAKING CARE OF THIS PATIENT: 23 minutes.   POSSIBLE D/C IN 4 DAYS, DEPENDING ON CLINICAL CONDITION.   Nathan Kramer M.D on 06/10/2016 at 8:26 AM  Between 7am to 6pm - Pager - [phone]  After 6pm go to [email or web address] - [employer] [facility]  Office  [phone]  CC: Primary care physician; Tracie Harrier, MD

## 2016-06-10 NOTE — Progress Notes (Signed)
Requested a one time dose of pain medication for insertion of three-way catheter. No new orders at this time.

## 2016-06-10 NOTE — Progress Notes (Signed)
Made Dr. Benjie Karvonen aware of worsening hematuria, urine looking much darker than the previous shifts. No new orders, told to continue monitoring.

## 2016-06-10 NOTE — Progress Notes (Signed)
Notified by Dr. Benjie Karvonen that the urologist wants a three-way catheter placed with CBI.

## 2016-06-10 NOTE — Progress Notes (Signed)
Patient complaining of severe abdominal pain, burning with attempt at urination, unable to urinate adequately, passing blood clots in urinal. Dr. Benjie Karvonen made aware, new orders for Urology consult and an in and out cath. Updated patient and his wife.

## 2016-06-10 NOTE — Progress Notes (Addendum)
Progress Note  Patient Name: Nathan Kramer Date of Encounter: 06/10/2016  Primary Cardiologist: [facility]   Subjective   Breathing OK.  Swelling is down but not at baseline.  No chest pain.  Foley is out  Inpatient Medications    Scheduled Meds: . albumin human  12.5 g Intravenous Daily  . aspirin EC  81 mg Oral Daily  . docusate sodium  100 mg Oral BID  . feeding supplement (ENSURE ENLIVE)  237 mL Oral TID BM  . ferrous gluconate  324 mg Oral BID WC  . heparin  5,000 Units Subcutaneous Q8H  . levothyroxine  25 mcg Oral QAC breakfast  . morphine  30 mg Oral TID WC  . polyethylene glycol  17 g Oral Daily  . potassium chloride  10 mEq Oral BID  . sodium chloride flush  3 mL Intravenous Q12H  . spironolactone  25 mg Oral Daily   Continuous Infusions: . furosemide (LASIX) infusion 10 mg/hr (06/10/16 0018)   PRN Meds: sodium chloride, acetaminophen **OR** acetaminophen, alum & mag hydroxide-simeth, bisacodyl, HYDROcodone-acetaminophen, ondansetron **OR** ondansetron (ZOFRAN) IV, senna-docusate, sodium chloride, sodium chloride flush   Vital Signs    Vitals:   06/09/16 1940 06/10/16 0405 06/10/16 0928 06/10/16 1002  BP: (!) 106/53 (!) 110/47 (!) 113/53 (!) 107/53  Pulse: 86 81 91 89  Resp: 17 16 18 18   Temp: 97.6 F (36.4 C) 98 F (36.7 C) 97.5 F (36.4 C)   TempSrc: Oral  Oral   SpO2: 98% 93% 94% 100%  Weight:  170 lb 3.2 oz (77.2 kg)    Height:        Intake/Output Summary (Last 24 hours) at 06/10/16 1049 Last data filed at 06/10/16 0920  Gross per 24 hour  Intake              490 ml  Output             2800 ml  Net            -2310 ml   Filed Weights   06/08/16 0519 06/09/16 0357 06/10/16 0405  Weight: 170 lb 14.4 oz (77.5 kg) 171 lb 1.6 oz (77.6 kg) 170 lb 3.2 oz (77.2 kg)    Telemetry    NA - Personally Reviewed  ECG    NA - Personally Reviewed  Physical Exam   GEN: No acute distress.   Neck: No JVD Cardiac: RRR, systolic murmurs, no rubs, or  gallops.  Respiratory: Clear to auscultation bilaterally. GI: Soft, nontender, non-distended  MS: Severe leg edema; No deformity. Neuro:  Nonfocal  Psych: Normal affect   Labs    Chemistry Recent Labs Lab 06/04/16 0444  06/08/16 0614 06/09/16 0414 06/10/16 0535  NA 139  < > 140 138 139  K 3.7  < > 4.2 4.3 4.2  CL 98*  < > 96* 93* 90*  CO2 30  < > 33* 34* 37*  GLUCOSE 101*  < > 134* 105* 120*  BUN 63*  < > 70* 79* 81*  CREATININE 1.83*  < > 1.69* 1.88* 1.69*  CALCIUM 9.0  < > 9.3 9.6 9.5  ALBUMIN 3.6  --   --   --   --   GFRNONAA 34*  < > 37* 33* 37*  GFRAA 39*  < > 43* 38* 43*  ANIONGAP 11  < > 11 11 12   < > = values in this interval not displayed.   Hematology Recent Labs Lab 06/08/16 [phone]  06/09/16 0414 06/10/16 0535  WBC 28.4* 26.5* 26.9*  RBC 6.87* 6.81* 6.69*  HGB 13.1 12.9* 12.7*  HCT 44.8 43.9 43.0  MCV 65.3* 64.4* 64.3*  MCH 19.0* 18.9* 19.0*  MCHC 29.2* 29.3* 29.6*  RDW 22.6* 22.0* 22.5*  PLT 363 384 382    Cardiac EnzymesNo results for input(s): TROPONINI in the last 168 hours. No results for input(s): TROPIPOC in the last 168 hours.   BNPNo results for input(s): BNP, PROBNP in the last 168 hours.   DDimer No results for input(s): DDIMER in the last 168 hours.   Radiology    No results found.  Cardiac Studies   ECHO  06/01/2016 - Left ventricle: The cavity size was normal. There was mild   concentric hypertrophy. Systolic function was normal. The   estimated ejection fraction was in the range of 50% to 55%. Wall   motion was normal; there were no regional wall motion   abnormalities. Features are consistent with a pseudonormal left   ventricular filling pattern, with concomitant abnormal relaxation   and increased filling pressure (grade 2 diastolic dysfunction). - Ventricular septum: The contour showed systolic flattening. These   changes are consistent with RV pressure overload. - Aortic valve: There was moderate to severe stenosis. There  was   moderate regurgitation. Mean gradient (S): 22 mm Hg. Valve area   (VTI): 0.91 cm^2. - Mitral valve: Calcified annulus. Mildly thickened, mildly   calcified leaflets . There was mild regurgitation. - Left atrium: The atrium was mildly dilated. - Right atrium: The atrium was mildly dilated. - Pulmonary arteries: Systolic pressure was moderately increased.   PA peak pressure: 55 mm Hg (S).  Patient Profile     81 y.o. male with h/o chronic diastolic CHF, prior stroke 09/2012, moderate to severe aortic stenosis, moderate AI, chronic LE edema, chronic back pain, and prostate cancer s/p radiation who presented to [facility] with increaed SOB, LE swelling, and weight gain of 30 pounds since   Assessment & Plan    ACUTE ON CHRONIC DIASTOLIC HF:    BB04 liters since admission.   Weight has not really budged.  He was down two liters which I think is about as much as we can expect without a bump in renal function.  I will continue current diuresis.  I worked with the patient and his wife to show him how his feet should be elevated.   AORTIC STENOSIS:  (Moderate/severe).    Continue medical management.    ELEVATED TROPONIN:  Demand ischemia.    ACUTE CKD STAGE III:    Creat is stable.  Continue current therapy.    Signed, Minus Breeding, MD  06/10/2016, 10:49 AM

## 2016-06-11 ENCOUNTER — Inpatient Hospital Stay: Payer: Medicare Other

## 2016-06-11 DIAGNOSIS — R338 Other retention of urine: Secondary | ICD-10-CM

## 2016-06-11 DIAGNOSIS — R310 Gross hematuria: Secondary | ICD-10-CM

## 2016-06-11 LAB — BASIC METABOLIC PANEL
Anion gap: 12 (ref 5–15)
BUN: 81 mg/dL — AB (ref 6–20)
CALCIUM: 9 mg/dL (ref 8.9–10.3)
CO2: 34 mmol/L — ABNORMAL HIGH (ref 22–32)
Chloride: 90 mmol/L — ABNORMAL LOW (ref 101–111)
Creatinine, Ser: 1.76 mg/dL — ABNORMAL HIGH (ref 0.61–1.24)
GFR calc Af Amer: 41 mL/min — ABNORMAL LOW (ref >60)
GFR, EST NON AFRICAN AMERICAN: 35 mL/min — AB (ref >60)
GLUCOSE: 121 mg/dL — AB (ref 65–99)
POTASSIUM: 4.2 mmol/L (ref 3.5–5.1)
Sodium: 136 mmol/L (ref 135–145)

## 2016-06-11 LAB — CBC
HCT: 40.9 % (ref 40.0–52.0)
Hemoglobin: 12.5 g/dL — ABNORMAL LOW (ref 13.0–18.0)
MCH: 19.6 pg — ABNORMAL LOW (ref 26.0–34.0)
MCHC: 30.4 g/dL — ABNORMAL LOW (ref 32.0–36.0)
MCV: 64.5 fL — ABNORMAL LOW (ref 80.0–100.0)
Platelets: 360 10*3/uL (ref 150–440)
RBC: 6.35 MIL/uL — ABNORMAL HIGH (ref 4.40–5.90)
RDW: 22.3 % — AB (ref 11.5–14.5)
WBC: 32.7 10*3/uL — ABNORMAL HIGH (ref 3.8–10.6)

## 2016-06-11 MED ORDER — BELLADONNA ALKALOIDS-OPIUM 16.2-60 MG RE SUPP
1.0000 | Freq: Three times a day (TID) | RECTAL | Status: DC | PRN
  Administered 2016-06-11 – 2016-06-12 (×3): 1 via RECTAL
  Filled 2016-06-11 (×6): qty 1

## 2016-06-11 MED ORDER — PHENAZOPYRIDINE HCL 100 MG PO TABS
100.0000 mg | ORAL_TABLET | Freq: Once | ORAL | Status: AC
  Administered 2016-06-11: 100 mg via ORAL
  Filled 2016-06-11: qty 1

## 2016-06-11 MED ORDER — PHENAZOPYRIDINE HCL 100 MG PO TABS
100.0000 mg | ORAL_TABLET | Freq: Three times a day (TID) | ORAL | Status: DC
  Filled 2016-06-11 (×2): qty 1

## 2016-06-11 MED ORDER — DOCUSATE SODIUM 100 MG PO CAPS
200.0000 mg | ORAL_CAPSULE | Freq: Two times a day (BID) | ORAL | Status: DC
  Administered 2016-06-11 – 2016-06-15 (×5): 200 mg via ORAL
  Filled 2016-06-11 (×5): qty 2

## 2016-06-11 MED ORDER — PHENAZOPYRIDINE HCL 100 MG PO TABS
100.0000 mg | ORAL_TABLET | Freq: Three times a day (TID) | ORAL | Status: DC | PRN
  Administered 2016-06-11 – 2016-06-14 (×4): 100 mg via ORAL
  Filled 2016-06-11 (×3): qty 1

## 2016-06-11 MED ORDER — LORAZEPAM 0.5 MG PO TABS
0.5000 mg | ORAL_TABLET | Freq: Four times a day (QID) | ORAL | Status: DC | PRN
  Administered 2016-06-13 – 2016-06-15 (×3): 0.5 mg via ORAL
  Filled 2016-06-11 (×3): qty 1

## 2016-06-11 NOTE — Progress Notes (Addendum)
[facility]  ROUNDING NOTE   Subjective:   Patient seen earlier today Lasix drip continued at 10 mg/hr Foley was removed but patient started to have bladder spasms and difficulty passing urine Foley was reinserted with urology assistance  Patient is Now undergoing bladder irrigation  Objective:  Vital signs in last 24 hours:  Temp:  [97.4 F (36.3 C)-97.9 F (36.6 C)] 97.9 F (36.6 C) (03/11 1124) Pulse Rate:  [85-89] 87 (03/11 1124) Resp:  [18-22] 19 (03/11 1124) BP: (89-109)/(46-63) 109/58 (03/11 1124) SpO2:  [93 %-100 %] 99 % (03/11 1124) Weight:  [76.8 kg (169 lb 4.8 oz)] 76.8 kg (169 lb 4.8 oz) (03/11 0340)  Weight change: -0.408 kg (-14.4 oz) Filed Weights   06/09/16 0357 06/10/16 0405 06/11/16 0340  Weight: 77.6 kg (171 lb 1.6 oz) 77.2 kg (170 lb 3.2 oz) 76.8 kg (169 lb 4.8 oz)    Intake/Output: I/O last 3 completed shifts: In: 30160 [P.O.:360; I.V.:380; FUXNA:35573; IV Piggyback:200] Out: 22025 [Urine:13675]   Intake/Output this shift:  Total I/O In: 6100 [I.V.:50; Other:6000; IV Piggyback:50] Out: 6800 [Urine:6800]  Physical Exam: General: NAD, sitting up in bed  Head: Moist oral mucosal membranes, reddish tongue  Eyes: Anicteric,  Neck: Supple, trachea midline, +JVD  Lungs:  Bilateral wheezes  Heart: +3/6 crescendo murmur, irregular rhythm  Abdomen:  Soft, nontender, Distended, abdominal wall edema ++  Extremities: UNNA Boots bilaterally, 2+ peripheral edema upto lower edema  Neurologic: Nonfocal, moving all four extremities        Penile edema    Basic Metabolic Panel:  Recent Labs Lab 06/07/16 0436 06/08/16 0614 06/09/16 0414 06/10/16 0535 06/11/16 0432  NA 141 140 138 139 136  K 3.7 4.2 4.3 4.2 4.2  CL 97* 96* 93* 90* 90*  CO2 35* 33* 34* 37* 34*  GLUCOSE 131* 134* 105* 120* 121*  BUN 67* 70* 79* 81* 81*  CREATININE 1.72* 1.69* 1.88* 1.69* 1.76*  CALCIUM 9.4 9.3 9.6 9.5 9.0    Liver Function Tests: No results for  input(s): AST, ALT, ALKPHOS, BILITOT, PROT, ALBUMIN in the last 168 hours. No results for input(s): LIPASE, AMYLASE in the last 168 hours. No results for input(s): AMMONIA in the last 168 hours.  CBC:  Recent Labs Lab 06/07/16 0436 06/08/16 0614 06/09/16 0414 06/10/16 0535 06/11/16 0432  WBC 30.9* 28.4* 26.5* 26.9* 32.7*  HGB 12.7* 13.1 12.9* 12.7* 12.5*  HCT 42.5 44.8 43.9 43.0 40.9  MCV 63.6* 65.3* 64.4* 64.3* 64.5*  PLT 369 363 384 382 360    Cardiac Enzymes: No results for input(s): CKTOTAL, CKMB, CKMBINDEX, TROPONINI in the last 168 hours.  BNP: Invalid input(s): POCBNP  CBG:  Recent Labs Lab 06/07/16 0748 06/10/16 0804  GLUCAP 104* 116*    Microbiology: Results for orders placed or performed during the hospital encounter of 05/31/16  Blood culture (routine x 2)     Status: None   Collection Time: 06/01/16 12:20 AM  Result Value Ref Range Status   Specimen Description BLOOD  L WRIST  Final   Special Requests BOTTLES DRAWN AEROBIC AND ANAEROBIC  ADEQUATE   Final   Culture NO GROWTH 5 DAYS  Final   Report Status 06/06/2016 FINAL  Final  Blood culture (routine x 2)     Status: None   Collection Time: 06/01/16  1:11 AM  Result Value Ref Range Status   Specimen Description BLOOD  R FOREARM  Final   Special Requests BOTTLES DRAWN AEROBIC AND ANAEROBIC  ADEQUATE  Final   Culture NO GROWTH 5 DAYS  Final   Report Status 06/06/2016 FINAL  Final    Coagulation Studies: No results for input(s): LABPROT, INR in the last 72 hours.  Urinalysis:  Recent Labs  06/09/16 2330  PROTEINUR TEST NOT REPORTED DUE TO COLOR INTERFERENCE OF URINE PIGMENT*  NITRITE TEST NOT REPORTED DUE TO COLOR INTERFERENCE OF URINE PIGMENT*  LEUKOCYTESUR TEST NOT REPORTED DUE TO COLOR INTERFERENCE OF URINE PIGMENT*      Imaging: Ct Abdomen Pelvis Wo Contrast  Result Date: 06/10/2016 CLINICAL DATA:  Catheter removed, now with hematuria EXAM: CT ABDOMEN AND PELVIS WITHOUT CONTRAST  TECHNIQUE: Multidetector CT imaging of the abdomen and pelvis was performed following the standard protocol without IV contrast. COMPARISON:  CT abdomen pelvis - 01/11/2016 FINDINGS: The lack of intravenous contrast limits the ability to evaluate solid abdominal organs. Lower chest: Evaluation of lower thorax is degraded secondary to patient respiratory artifact. Limited visualization of the lower thorax demonstrates trace/small bilateral effusions with associated subsegmental atelectasis within the imaged bilateral lung bases. Normal heart size. Coronary artery calcifications. Calcifications within the aortic valve leaflets and mitral valve annulus. Small amount of pericardial fluid, presumably physiologic. Hepatobiliary: Normal hepatic contour. Punctate calcification with the caudate, likely the sequela of prior glomus infection. Normal noncontrast appearance of the gallbladder given degree distention. No radiopaque gallstones. Trace amount of intra-abdominal ascites. Pancreas: Largely fatty replacement of the pancreas. Spleen: Splenomegaly with the spleen measuring 18 cm in length, similar to the 01/2016 examination. Adrenals/Urinary Tract: Bilateral nonobstructing nephrolithiasis with dominant right-sided nonobstructing renal stone within the posterior interpolar aspect the right kidney measuring 1 cm in diameter (53, series 2 and index nonobstructing stone within the inferior pole the left kidney measuring 0.5 cm (image 60, series 2). No renal stones are seen along expected course of either ureter or the urinary bladder. Normal noncontrast appearance the urinary bladder given underdistention. Multiple bilateral predominately exophytic hypoattenuating renal lesions are seen with index exophytic lesion arising from the anterior inferior aspect the left kidney measuring 4.9 cm in diameter. Renal lesions are incompletely characterized on this noncontrast examination though appear morphologically similar to the  01/2016 examination are favored to represent renal cysts. Normal noncontrast appearance of the bilateral adrenal glands. Stomach/Bowel: Moderate colonic stool burden without evidence of enteric obstruction. Normal noncontrast appearance of the terminal ileum and retrocecal appendix. No pneumoperitoneum, pneumatosis or portal venous gas. Vascular/Lymphatic: Moderate amount of eccentric calcified plaque within a tortuous but normal caliber abdominal aorta. Scattered retroperitoneal and mesenteric lymph nodes are numerous though individually not enlarged by size criteria. No definitive bulky retroperitoneal, mesenteric, pelvic or inguinal lymphadenopathy on this noncontrast examination. Reproductive: Dystrophic calcifications within normal sized prostate gland. Small amount of fluid within the pelvic cul-de-sac. Scrotal edema. Other: Large amount of body wall subcutaneous edema. Musculoskeletal: No acute or aggressive osseous abnormalities. Mild-to-moderate multilevel lumbar spine DDD, worse at L2-L3 with disc space height loss, endplate irregularity and sclerosis. Stigmata of DISH with the caudal aspect of the thoracic spine. IMPRESSION: 1. No explanation for patient's hematuria. Specifically, normal noncontrast appearance of the urinary bladder given degree of distention. No evidence of urinary obstruction. 2. Small bilateral effusions, small amount of intra-abdominal ascites and a large amount of body wall subcutaneous edema, findings are nonspecific though favored to represent congestive heart failure. Clinical correlation is advised. 3. Similar findings of splenomegaly without definitive morphologic changes of cirrhosis on this noncontrast exam. 4. Bilateral nonobstructing nephrolithiasis. 5. Grossly unchanged bilateral hypoattenuating renal lesions, incompletely  characterized on this noncontrast examination though morphologically similar to the 01/2016 exam and favored to represent renal cysts. 6. Coronary artery  calcifications. Aortic Atherosclerosis (ICD10-170.0) Electronically Signed   By: Sandi Mariscal M.D.   On: 06/10/2016 11:04   Dg Abd 1 View  Result Date: 06/11/2016 CLINICAL DATA:  History of constipation and abdominal pain EXAM: ABDOMEN - 1 VIEW COMPARISON:  06/10/2016 FINDINGS: Scattered large and small bowel gas is noted. Fecal material is noted within the colon similar to that seen on recent CT. No free air is noted. No abnormal mass is seen. Degenerative change of the lumbar spine is noted. IMPRESSION: No acute abnormality noted. Electronically Signed   By: Inez Catalina M.D.   On: 06/11/2016 08:34     Medications:   . furosemide (LASIX) infusion 10 mg/hr (06/10/16 2222)   . albumin human  12.5 g Intravenous Daily  . cefTRIAXone (ROCEPHIN) IVPB 1 gram/50 mL D5W  1 g Intravenous Q24H  . docusate sodium  200 mg Oral BID  . feeding supplement (ENSURE ENLIVE)  237 mL Oral TID BM  . ferrous gluconate  324 mg Oral BID WC  . levothyroxine  25 mcg Oral QAC breakfast  . morphine  30 mg Oral TID WC  . polyethylene glycol  17 g Oral Daily  . potassium chloride  10 mEq Oral BID  . sodium chloride flush  3 mL Intravenous Q12H  . spironolactone  25 mg Oral Daily   sodium chloride, acetaminophen **OR** acetaminophen, alum & mag hydroxide-simeth, bisacodyl, HYDROcodone-acetaminophen, LORazepam, ondansetron **OR** ondansetron (ZOFRAN) IV, opium-belladonna, phenazopyridine, senna-docusate, sodium chloride, sodium chloride flush  Assessment/ Plan:  Nathan Kramer is a 81 y.o. white male with diastolic congestive heart failure, hypertension, CVA, BPH, , who was admitted to [facility] on 05/31/2016   1. Acute renal failure on chronic kidney disease stage III: with proteinuria, anasarca.  Chronic kidney disease secondary to hypertension. Acute renal failure from acute cardiorenal syndrome from diastolic congestive heart failure.  - furosemide gtt  At 10 mg/hr -continue iv albumin for oncotic  support   2.  Anasarca; Cardiac - aortic stenosis. Gr 2 diastolic dysfunction; EF 02-58%, Pulm HTN and RV pressure overload - UNNA boots - low salt diet - fluid restriction - Furosemide gtt - continue spironolactone.  - NO Cirrhosis on CT examination, but splenomegaly noted       LOS: 10 Novella Abraha 3/11/201812:47 PM

## 2016-06-11 NOTE — Progress Notes (Signed)
Progress Note  Patient Name: Nathan Kramer Date of Encounter: 06/11/2016  Primary Cardiologist: [facility]   Subjective   Foley back in.   No acute complaints this morning.  He is not reporting SOB or chest pain.   Inpatient Medications    Scheduled Meds: . albumin human  12.5 g Intravenous Daily  . cefTRIAXone (ROCEPHIN) IVPB 1 gram/50 mL D5W  1 g Intravenous Q24H  . docusate sodium  200 mg Oral BID  . feeding supplement (ENSURE ENLIVE)  237 mL Oral TID BM  . ferrous gluconate  324 mg Oral BID WC  . levothyroxine  25 mcg Oral QAC breakfast  . morphine  30 mg Oral TID WC  . polyethylene glycol  17 g Oral Daily  . potassium chloride  10 mEq Oral BID  . sodium chloride flush  3 mL Intravenous Q12H  . spironolactone  25 mg Oral Daily   Continuous Infusions: . furosemide (LASIX) infusion 10 mg/hr (06/10/16 2222)   PRN Meds: sodium chloride, acetaminophen **OR** acetaminophen, alum & mag hydroxide-simeth, bisacodyl, HYDROcodone-acetaminophen, LORazepam, ondansetron **OR** ondansetron (ZOFRAN) IV, opium-belladonna, phenazopyridine, senna-docusate, sodium chloride, sodium chloride flush   Vital Signs    Vitals:   06/11/16 0340 06/11/16 0342 06/11/16 0838 06/11/16 1124  BP: (!) 89/57 (!) 99/46 107/63 (!) 109/58  Pulse: 89 87 85 87  Resp: 18  (!) 22 19  Temp: 97.9 F (36.6 C)  97.4 F (36.3 C) 97.9 F (36.6 C)  TempSrc: Oral  Oral Oral  SpO2: 100% 100% 100% 99%  Weight: 169 lb 4.8 oz (76.8 kg)     Height:        Intake/Output Summary (Last 24 hours) at 06/11/16 1336 Last data filed at 06/11/16 1255  Gross per 24 hour  Intake            29170 ml  Output            20900 ml  Net             8270 ml   Filed Weights   06/09/16 0357 06/10/16 0405 06/11/16 0340  Weight: 171 lb 1.6 oz (77.6 kg) 170 lb 3.2 oz (77.2 kg) 169 lb 4.8 oz (76.8 kg)    Telemetry    NSR, PVCs- Personally Reviewed  ECG    NA - Personally Reviewed  Physical Exam   GEN: No acute distress.     Neck: No JVD Cardiac: RRR, systolic murmurs, no rubs, or gallops.  Respiratory: Clear to auscultation bilaterally. GI: Soft, nontender, non-distended  MS: Severe leg edema and anasarca to the abdomen; No deformity. Neuro:  Nonfocal  Psych: Normal affect   Labs    Chemistry  Recent Labs Lab 06/09/16 0414 06/10/16 0535 06/11/16 0432  NA 138 139 136  K 4.3 4.2 4.2  CL 93* 90* 90*  CO2 34* 37* 34*  GLUCOSE 105* 120* 121*  BUN 79* 81* 81*  CREATININE 1.88* 1.69* 1.76*  CALCIUM 9.6 9.5 9.0  GFRNONAA 33* 37* 35*  GFRAA 38* 43* 41*  ANIONGAP 11 12 12      Hematology  Recent Labs Lab 06/09/16 0414 06/10/16 0535 06/11/16 0432  WBC 26.5* 26.9* 32.7*  RBC 6.81* 6.69* 6.35*  HGB 12.9* 12.7* 12.5*  HCT 43.9 43.0 40.9  MCV 64.4* 64.3* 64.5*  MCH 18.9* 19.0* 19.6*  MCHC 29.3* 29.6* 30.4*  RDW 22.0* 22.5* 22.3*  PLT 384 382 360    Cardiac EnzymesNo results for input(s): TROPONINI in the last 168  hours. No results for input(s): TROPIPOC in the last 168 hours.   BNPNo results for input(s): BNP, PROBNP in the last 168 hours.   DDimer No results for input(s): DDIMER in the last 168 hours.   Radiology    Ct Abdomen Pelvis Wo Contrast  Result Date: 06/10/2016 CLINICAL DATA:  Catheter removed, now with hematuria EXAM: CT ABDOMEN AND PELVIS WITHOUT CONTRAST TECHNIQUE: Multidetector CT imaging of the abdomen and pelvis was performed following the standard protocol without IV contrast. COMPARISON:  CT abdomen pelvis - 01/11/2016 FINDINGS: The lack of intravenous contrast limits the ability to evaluate solid abdominal organs. Lower chest: Evaluation of lower thorax is degraded secondary to patient respiratory artifact. Limited visualization of the lower thorax demonstrates trace/small bilateral effusions with associated subsegmental atelectasis within the imaged bilateral lung bases. Normal heart size. Coronary artery calcifications. Calcifications within the aortic valve leaflets and  mitral valve annulus. Small amount of pericardial fluid, presumably physiologic. Hepatobiliary: Normal hepatic contour. Punctate calcification with the caudate, likely the sequela of prior glomus infection. Normal noncontrast appearance of the gallbladder given degree distention. No radiopaque gallstones. Trace amount of intra-abdominal ascites. Pancreas: Largely fatty replacement of the pancreas. Spleen: Splenomegaly with the spleen measuring 18 cm in length, similar to the 01/2016 examination. Adrenals/Urinary Tract: Bilateral nonobstructing nephrolithiasis with dominant right-sided nonobstructing renal stone within the posterior interpolar aspect the right kidney measuring 1 cm in diameter (53, series 2 and index nonobstructing stone within the inferior pole the left kidney measuring 0.5 cm (image 60, series 2). No renal stones are seen along expected course of either ureter or the urinary bladder. Normal noncontrast appearance the urinary bladder given underdistention. Multiple bilateral predominately exophytic hypoattenuating renal lesions are seen with index exophytic lesion arising from the anterior inferior aspect the left kidney measuring 4.9 cm in diameter. Renal lesions are incompletely characterized on this noncontrast examination though appear morphologically similar to the 01/2016 examination are favored to represent renal cysts. Normal noncontrast appearance of the bilateral adrenal glands. Stomach/Bowel: Moderate colonic stool burden without evidence of enteric obstruction. Normal noncontrast appearance of the terminal ileum and retrocecal appendix. No pneumoperitoneum, pneumatosis or portal venous gas. Vascular/Lymphatic: Moderate amount of eccentric calcified plaque within a tortuous but normal caliber abdominal aorta. Scattered retroperitoneal and mesenteric lymph nodes are numerous though individually not enlarged by size criteria. No definitive bulky retroperitoneal, mesenteric, pelvic or  inguinal lymphadenopathy on this noncontrast examination. Reproductive: Dystrophic calcifications within normal sized prostate gland. Small amount of fluid within the pelvic cul-de-sac. Scrotal edema. Other: Large amount of body wall subcutaneous edema. Musculoskeletal: No acute or aggressive osseous abnormalities. Mild-to-moderate multilevel lumbar spine DDD, worse at L2-L3 with disc space height loss, endplate irregularity and sclerosis. Stigmata of DISH with the caudal aspect of the thoracic spine. IMPRESSION: 1. No explanation for patient's hematuria. Specifically, normal noncontrast appearance of the urinary bladder given degree of distention. No evidence of urinary obstruction. 2. Small bilateral effusions, small amount of intra-abdominal ascites and a large amount of body wall subcutaneous edema, findings are nonspecific though favored to represent congestive heart failure. Clinical correlation is advised. 3. Similar findings of splenomegaly without definitive morphologic changes of cirrhosis on this noncontrast exam. 4. Bilateral nonobstructing nephrolithiasis. 5. Grossly unchanged bilateral hypoattenuating renal lesions, incompletely characterized on this noncontrast examination though morphologically similar to the 01/2016 exam and favored to represent renal cysts. 6. Coronary artery calcifications. Aortic Atherosclerosis (ICD10-170.0) Electronically Signed   By: Sandi Mariscal M.D.   On: 06/10/2016 11:04  Dg Abd 1 View  Result Date: 06/11/2016 CLINICAL DATA:  History of constipation and abdominal pain EXAM: ABDOMEN - 1 VIEW COMPARISON:  06/10/2016 FINDINGS: Scattered large and small bowel gas is noted. Fecal material is noted within the colon similar to that seen on recent CT. No free air is noted. No abnormal mass is seen. Degenerative change of the lumbar spine is noted. IMPRESSION: No acute abnormality noted. Electronically Signed   By: Inez Catalina M.D.   On: 06/11/2016 08:34    Cardiac Studies     ECHO  06/01/2016 - Left ventricle: The cavity size was normal. There was mild   concentric hypertrophy. Systolic function was normal. The   estimated ejection fraction was in the range of 50% to 55%. Wall   motion was normal; there were no regional wall motion   abnormalities. Features are consistent with a pseudonormal left   ventricular filling pattern, with concomitant abnormal relaxation   and increased filling pressure (grade 2 diastolic dysfunction). - Ventricular septum: The contour showed systolic flattening. These   changes are consistent with RV pressure overload. - Aortic valve: There was moderate to severe stenosis. There was   moderate regurgitation. Mean gradient (S): 22 mm Hg. Valve area   (VTI): 0.91 cm^2. - Mitral valve: Calcified annulus. Mildly thickened, mildly   calcified leaflets . There was mild regurgitation. - Left atrium: The atrium was mildly dilated. - Right atrium: The atrium was mildly dilated. - Pulmonary arteries: Systolic pressure was moderately increased.   PA peak pressure: 55 mm Hg (S).  Patient Profile     81 y.o. male with h/o chronic diastolic CHF, prior stroke 09/2012, moderate to severe aortic stenosis, moderate AI, chronic LE edema, chronic back pain, and prostate cancer s/p radiation who presented to [facility] with increaed SOB, LE swelling, and weight gain of 30 pounds since   Assessment & Plan    ACUTE ON CHRONIC DIASTOLIC HF:    Intake and output somewhat confusing as bladder irrigation is being included in the totals.  Looks like he is in total negative.  Weight is still unchanged.  Need to go by exam.  Exam is improved but still with severe anasarca.  Continue continuous IV Lasix.   AORTIC STENOSIS:  (Moderate/severe).    Continue medical management.    ELEVATED TROPONIN:  Demand ischemia.    ACUTE CKD STAGE III:    Creat is up slightly.  Continue current therapy.    Signed, Minus Breeding, MD  06/11/2016, 1:36 PM

## 2016-06-11 NOTE — Progress Notes (Signed)
Patient has continuous bladder irrigation going. The urine is red tinged and without clots. Emptying atleast 1000-1500cc of fluid every hour.  Patient is complaining of penile pain, medicated as needed.  Patient tried to have a bowel movement three times this shift without any results but is passing gas. Belly is distended and firm. Patient has slept periodically throughout the shift. Explained to spouse about fluid restrictions and ice consumption. Patient can have 120cc of liquid or ice.

## 2016-06-11 NOTE — Progress Notes (Addendum)
[place] [facility] [place] NAME: Nathan Kramer    MR#:  [id number]  DATE OF BIRTH:  1935-11-16  SUBJECTIVE:   Patient had frank hematuria yesterday and urology was consulted. Patient now has CBI. Patient is complaining of bladder spasm.  REVIEW OF SYSTEMS:    Review of Systems  Constitutional: Negative for chills and fever.  HENT: Negative for congestion, nosebleeds and tinnitus.   Eyes: Negative for blurred vision and double vision.  Respiratory: Negative for cough, shortness of breath (better) and wheezing.   Cardiovascular: Positive for leg swelling. Negative for chest pain, orthopnea and PND.  Gastrointestinal: Negative for abdominal pain, blood in stool, constipation, diarrhea, melena, nausea and vomiting.  Genitourinary: Positive for hematuria. Negative for dysuria.       Scrotal swelling  Neurological: Negative for dizziness, sensory change and focal weakness.  Psychiatric/Behavioral: Negative for hallucinations, memory loss and suicidal ideas. The patient is not nervous/anxious.   All other systems reviewed and are negative.   Nutrition: heart Healthy Tolerating Diet: Yes Tolerating PT: Home health at discharge.    DRUG ALLERGIES:   Allergies  Allergen Reactions  . Amoxicillin     Other reaction(s): Localized superficial swelling of skin Face swelling   . Reglan [Metoclopramide]     Involuntary movements body    VITALS:  Blood pressure (!) 99/46, pulse 87, temperature 97.9 F (36.6 C), temperature source Oral, resp. rate 18, height 5\' 8"  (1.727 m), weight 76.8 kg (169 lb 4.8 oz), SpO2 100 %.  PHYSICAL EXAMINATION:   Physical Exam  GENERAL:  81 y.o.-year-old patient sitting up in chair in mild acute distress and bladder spasm.  EYES: Legally blind. No scleral icterus. Extraocular muscles intact.  HEENT: Head atraumatic, normocephalic. Oropharynx and nasopharynx clear.  +JVD improving NECK:  Supple, no jugular venous  distention. No thyroid enlargement, no tenderness.  LUNGS:Clear to auscultation no wheezing, bibasilar rales, no rhonchi. No use of accessory muscles of respiration.  CARDIOVASCULAR: S1, S2 normal.3/6 SEM at LSB, No rubs, or gallops.  ABDOMEN: Soft, nontender, ND Bowel sounds present. No organomegaly or mass.  EXTREMITIES: No cyanosis, clubbing, + 2 edema b/l up to scrotum.    Legs wrapped in UNNA boots.  LEE much improved  NEUROLOGIC: Cranial nerves II through XII are intact. No focal Motor or sensory deficits b/l.  PSYCHIATRIC: The patient is alert and oriented x 3.  SKIN: No obvious rash, lesion,LEE wrapped  LABORATORY PANEL:   CBC  Recent Labs Lab 06/11/16 0432  WBC 32.7*  HGB 12.5*  HCT 40.9  PLT 360   ------------------------------------------------------------------------------------------------------------------  Chemistries   Recent Labs Lab 06/11/16 0432  NA 136  K 4.2  CL 90*  CO2 34*  GLUCOSE 121*  BUN 81*  CREATININE 1.76*  CALCIUM 9.0   ------------------------------------------------------------------------------------------------------------------  Cardiac Enzymes No results for input(s): TROPONINI in the last 168 hours. ------------------------------------------------------------------------------------------------------------------  RADIOLOGY:  Ct Abdomen Pelvis Wo Contrast  Result Date: 06/10/2016 CLINICAL DATA:  Catheter removed, now with hematuria EXAM: CT ABDOMEN AND PELVIS WITHOUT CONTRAST TECHNIQUE: Multidetector CT imaging of the abdomen and pelvis was performed following the standard protocol without IV contrast. COMPARISON:  CT abdomen pelvis - 01/11/2016 FINDINGS: The lack of intravenous contrast limits the ability to evaluate solid abdominal organs. Lower chest: Evaluation of lower thorax is degraded secondary to patient respiratory artifact. Limited visualization of the lower thorax demonstrates trace/small bilateral effusions with  associated subsegmental atelectasis within the imaged bilateral lung bases. Normal heart  size. Coronary artery calcifications. Calcifications within the aortic valve leaflets and mitral valve annulus. Small amount of pericardial fluid, presumably physiologic. Hepatobiliary: Normal hepatic contour. Punctate calcification with the caudate, likely the sequela of prior glomus infection. Normal noncontrast appearance of the gallbladder given degree distention. No radiopaque gallstones. Trace amount of intra-abdominal ascites. Pancreas: Largely fatty replacement of the pancreas. Spleen: Splenomegaly with the spleen measuring 18 cm in length, similar to the 01/2016 examination. Adrenals/Urinary Tract: Bilateral nonobstructing nephrolithiasis with dominant right-sided nonobstructing renal stone within the posterior interpolar aspect the right kidney measuring 1 cm in diameter (53, series 2 and index nonobstructing stone within the inferior pole the left kidney measuring 0.5 cm (image 60, series 2). No renal stones are seen along expected course of either ureter or the urinary bladder. Normal noncontrast appearance the urinary bladder given underdistention. Multiple bilateral predominately exophytic hypoattenuating renal lesions are seen with index exophytic lesion arising from the anterior inferior aspect the left kidney measuring 4.9 cm in diameter. Renal lesions are incompletely characterized on this noncontrast examination though appear morphologically similar to the 01/2016 examination are favored to represent renal cysts. Normal noncontrast appearance of the bilateral adrenal glands. Stomach/Bowel: Moderate colonic stool burden without evidence of enteric obstruction. Normal noncontrast appearance of the terminal ileum and retrocecal appendix. No pneumoperitoneum, pneumatosis or portal venous gas. Vascular/Lymphatic: Moderate amount of eccentric calcified plaque within a tortuous but normal caliber abdominal aorta.  Scattered retroperitoneal and mesenteric lymph nodes are numerous though individually not enlarged by size criteria. No definitive bulky retroperitoneal, mesenteric, pelvic or inguinal lymphadenopathy on this noncontrast examination. Reproductive: Dystrophic calcifications within normal sized prostate gland. Small amount of fluid within the pelvic cul-de-sac. Scrotal edema. Other: Large amount of body wall subcutaneous edema. Musculoskeletal: No acute or aggressive osseous abnormalities. Mild-to-moderate multilevel lumbar spine DDD, worse at L2-L3 with disc space height loss, endplate irregularity and sclerosis. Stigmata of DISH with the caudal aspect of the thoracic spine. IMPRESSION: 1. No explanation for patient's hematuria. Specifically, normal noncontrast appearance of the urinary bladder given degree of distention. No evidence of urinary obstruction. 2. Small bilateral effusions, small amount of intra-abdominal ascites and a large amount of body wall subcutaneous edema, findings are nonspecific though favored to represent congestive heart failure. Clinical correlation is advised. 3. Similar findings of splenomegaly without definitive morphologic changes of cirrhosis on this noncontrast exam. 4. Bilateral nonobstructing nephrolithiasis. 5. Grossly unchanged bilateral hypoattenuating renal lesions, incompletely characterized on this noncontrast examination though morphologically similar to the 01/2016 exam and favored to represent renal cysts. 6. Coronary artery calcifications. Aortic Atherosclerosis (ICD10-170.0) Electronically Signed   By: Sandi Mariscal M.D.   On: 06/10/2016 11:04     ASSESSMENT AND PLAN:   81 year old male with past medical history of hypertension, pulmonary hypertension, diastolic CHF, moderate aortic stenosis, history of GERD, prostate cancer who presented to the hospital due to shortness of breath and worsening lower extremity edema.  1.Acute on chronic diastolic dysfunction with  underlying pulmonary hypertension/anasarca: Still with sedema and signs of fluid overload, however much improved now that he is on increased dose of Lasix. Continue Lasix drip with albumin as per nephrology Continue to [place] cardiology and nephrology consultation.  Follow I's and O's and daily weights.   2. Hematuria: Patient now has Lupus urology consultation DC subcutaneous heparin and aspirin for now CBC for a.m. Further management as per UROLOGY. I ordered belladonna suppositories for bladder spasm   3. Moderate/Severe  Aortic Stenosis contributing to CHF: As  per Cardiology, he is a poor surgical candidate.   Repeat Echo shows no new changes.   He will follow up with Cardiology as outpatient.   4. CKD Stage III: Creatinine remains stable Continue to monitor [facility] Nephrology consult appreciated.   5. Leukocytosis - unclear etiology of leukocytosis.  Patient is without fever since admission  Urine analysis and chest x-ray was negative for infection. WBC increasing but no clear source. Reevaluate in am,  6. Hypothyroidism: Continue Synthroid.  7. LE edema due to CHF and venous stasis.  Unna boots and Lasix  Wound care consult appreciated [employer] to be changed tomorrow. 8. Elevated troponin due to demand ischemia and not ACS  9. Nose bleeds, resolved: Nasal saline spray  10. Constipation: Continue daily stool softeners  Management plans discussed with the patient and wife and he is in agreement  CODE STATUS: DNR    TOTAL TIME TAKING CARE OF THIS PATIENT: 23 minutes.   POSSIBLE D/C IN 3-4 DAYS, DEPENDING ON CLINICAL CONDITION.   Iven Earnhart M.D on 06/11/2016 at 7:45 AM  Between 7am to 6pm - Pager - [phone]  After 6pm go to [email or web address] - [employer] [facility] [place] [facility]  Office  [phone]  CC: Primary care physician; Tracie Harrier, MD

## 2016-06-11 NOTE — Plan of Care (Signed)
Problem: Pain Managment: Goal: General experience of comfort will improve Outcome: Not Progressing Patient is currently having leg and penile pain, minimal relief with pain medication.

## 2016-06-11 NOTE — Consult Note (Signed)
Urology Consult  Referring physician: Dr. Benjie Karvonen Reason for referral: Gross hematuria and difficult foley placement  Chief Complaint: suprapubic pain  History of Present Illness: Nathan Kramer is a 81yo with a hx of CHF and prostate cancer admitted with CHF exacerbating and is currently on lasix. We had a foley catheter placed int he ER and he developed gross hematuria today. The foley was discontinued and since then the patient has been having frequency of urination q 10 minutes with volumes of 20-30cc. He has severe constant sharp, nonradiating suprapubic pain. No hx of UTIs. No prior hx of gross hematuria. He has a hx of prostate cancer but he was unable to recall if he had radiation versus prostatectomy.  Nursing had tried multiple times to place a foley catehter which were unsuccessful.  Past Medical History:  Diagnosis Date  . Anemia   . Aortic insufficiency   . Aortic stenosis   . Chronic diastolic CHF (congestive heart failure) ([place]) 01/2016  . Mitral regurgitation   . Other chronic pain   . Prostate cancer ([place])   . Reflux esophagitis   . Stroke ([place])   . Unspecified essential hypertension   . Unspecified visual loss    Past Surgical History:  Procedure Laterality Date  . back surgery    . COLONOSCOPY      Medications: I have reviewed the patient's current medications. Allergies:  Allergies  Allergen Reactions  . Amoxicillin     Other reaction(s): Localized superficial swelling of skin Face swelling   . Reglan [Metoclopramide]     Involuntary movements body    Family History  Problem Relation Age of Onset  . Diabetes Mother   . Hypertension Mother   . Hypertension Father   . Heart disease Father   . Cancer Sister   . Heart disease Brother    Social History:  reports that he has been smoking Cigars.  He has a 75.00 pack-year smoking history. He has never used smokeless tobacco. He reports that he does not drink alcohol or use drugs.  Review of Systems   Constitutional: Positive for malaise/fatigue.  Respiratory: Positive for cough.   Gastrointestinal: Positive for abdominal pain.  Genitourinary: Positive for dysuria, frequency, hematuria and urgency.  All other systems reviewed and are negative.   Physical Exam:  Vital signs in last 24 hours: Temp:  [97.4 F (36.3 C)-97.9 F (36.6 C)] 97.4 F (36.3 C) (03/11 0838) Pulse Rate:  [85-89] 85 (03/11 0838) Resp:  [18-22] 22 (03/11 0838) BP: (89-107)/(46-68) 107/63 (03/11 0838) SpO2:  [93 %-100 %] 100 % (03/11 0838) Weight:  [76.8 kg (169 lb 4.8 oz)] 76.8 kg (169 lb 4.8 oz) (03/11 0340) Physical Exam  Constitutional: He is oriented to person, place, and time. He appears well-developed. He appears distressed.  HENT:  Head: Normocephalic and atraumatic.  Eyes: EOM are normal. Pupils are equal, round, and reactive to light.  Neck: Normal range of motion. No thyromegaly present.  Cardiovascular: Normal rate and regular rhythm.   Respiratory: Effort normal. No respiratory distress.  GI: Soft. He exhibits distension. There is tenderness. Hernia confirmed negative in the right inguinal area and confirmed negative in the left inguinal area.  Genitourinary: Right testis shows no mass. Left testis shows mass. Uncircumcised. Penile tenderness present.  Genitourinary Comments: Severe penile edema  Musculoskeletal: Normal range of motion. He exhibits edema.  Lymphadenopathy:       Right: No inguinal adenopathy present.       Left: No inguinal adenopathy present.  Neurological: He is alert and oriented to person, place, and time.  Skin: Skin is warm and dry.  Psychiatric: He has a normal mood and affect. His behavior is normal. Judgment and thought content normal.    Laboratory Data:  Results for orders placed or performed during the hospital encounter of 05/31/16 (from the past 72 hour(s))  Basic metabolic panel     Status: Abnormal   Collection Time: 06/09/16  4:14 AM  Result Value Ref  Range   Sodium 138 135 - 145 mmol/L   Potassium 4.3 3.5 - 5.1 mmol/L   Chloride 93 (L) 101 - 111 mmol/L   CO2 34 (H) 22 - 32 mmol/L   Glucose, Bld 105 (H) 65 - 99 mg/dL   BUN 79 (H) 6 - 20 mg/dL   Creatinine, Ser 1.88 (H) 0.61 - 1.24 mg/dL   Calcium 9.6 8.9 - 10.3 mg/dL   GFR calc non Af Amer 33 (L) >60 mL/min   GFR calc Af Amer 38 (L) >60 mL/min    Comment: (NOTE) The eGFR has been calculated using the CKD EPI equation. This calculation has not been validated in all clinical situations. eGFR's persistently <60 mL/min signify possible Chronic Kidney Disease.    Anion gap 11 5 - 15  CBC     Status: Abnormal   Collection Time: 06/09/16  4:14 AM  Result Value Ref Range   WBC 26.5 (H) 3.8 - 10.6 K/uL   RBC 6.81 (H) 4.40 - 5.90 MIL/uL   Hemoglobin 12.9 (L) 13.0 - 18.0 g/dL    Comment: RESULT REPEATED AND VERIFIED   HCT 43.9 40.0 - 52.0 %   MCV 64.4 (L) 80.0 - 100.0 fL   MCH 18.9 (L) 26.0 - 34.0 pg   MCHC 29.3 (L) 32.0 - 36.0 g/dL   RDW 22.0 (H) 11.5 - 14.5 %   Platelets 384 150 - 440 K/uL  Urinalysis, Complete w Microscopic     Status: Abnormal   Collection Time: 06/09/16 11:30 PM  Result Value Ref Range   Protein, ur (A) NEGATIVE mg/dL    TEST NOT REPORTED DUE TO COLOR INTERFERENCE OF URINE PIGMENT   Nitrite (A) NEGATIVE    TEST NOT REPORTED DUE TO COLOR INTERFERENCE OF URINE PIGMENT   Leukocytes, UA (A) NEGATIVE    TEST NOT REPORTED DUE TO COLOR INTERFERENCE OF URINE PIGMENT  CBC     Status: Abnormal   Collection Time: 06/10/16  5:35 AM  Result Value Ref Range   WBC 26.9 (H) 3.8 - 10.6 K/uL   RBC 6.69 (H) 4.40 - 5.90 MIL/uL   Hemoglobin 12.7 (L) 13.0 - 18.0 g/dL    Comment: RESULT REPEATED AND VERIFIED   HCT 43.0 40.0 - 52.0 %   MCV 64.3 (L) 80.0 - 100.0 fL   MCH 19.0 (L) 26.0 - 34.0 pg   MCHC 29.6 (L) 32.0 - 36.0 g/dL   RDW 22.5 (H) 11.5 - 14.5 %   Platelets 382 150 - 440 K/uL  Basic metabolic panel     Status: Abnormal   Collection Time: 06/10/16  5:35 AM  Result  Value Ref Range   Sodium 139 135 - 145 mmol/L   Potassium 4.2 3.5 - 5.1 mmol/L   Chloride 90 (L) 101 - 111 mmol/L   CO2 37 (H) 22 - 32 mmol/L   Glucose, Bld 120 (H) 65 - 99 mg/dL   BUN 81 (H) 6 - 20 mg/dL   Creatinine, Ser 1.69 (H) 0.61 - 1.24  mg/dL   Calcium 9.5 8.9 - 10.3 mg/dL   GFR calc non Af Amer 37 (L) >60 mL/min   GFR calc Af Amer 43 (L) >60 mL/min    Comment: (NOTE) The eGFR has been calculated using the CKD EPI equation. This calculation has not been validated in all clinical situations. eGFR's persistently <60 mL/min signify possible Chronic Kidney Disease.    Anion gap 12 5 - 15  Glucose, capillary     Status: Abnormal   Collection Time: 06/10/16  8:04 AM  Result Value Ref Range   Glucose-Capillary 116 (H) 65 - 99 mg/dL   Comment 1 Notify RN   Basic metabolic panel     Status: Abnormal   Collection Time: 06/11/16  4:32 AM  Result Value Ref Range   Sodium 136 135 - 145 mmol/L   Potassium 4.2 3.5 - 5.1 mmol/L   Chloride 90 (L) 101 - 111 mmol/L   CO2 34 (H) 22 - 32 mmol/L   Glucose, Bld 121 (H) 65 - 99 mg/dL   BUN 81 (H) 6 - 20 mg/dL   Creatinine, Ser 1.76 (H) 0.61 - 1.24 mg/dL   Calcium 9.0 8.9 - 10.3 mg/dL   GFR calc non Af Amer 35 (L) >60 mL/min   GFR calc Af Amer 41 (L) >60 mL/min    Comment: (NOTE) The eGFR has been calculated using the CKD EPI equation. This calculation has not been validated in all clinical situations. eGFR's persistently <60 mL/min signify possible Chronic Kidney Disease.    Anion gap 12 5 - 15  CBC     Status: Abnormal   Collection Time: 06/11/16  4:32 AM  Result Value Ref Range   WBC 32.7 (H) 3.8 - 10.6 K/uL   RBC 6.35 (H) 4.40 - 5.90 MIL/uL   Hemoglobin 12.5 (L) 13.0 - 18.0 g/dL    Comment: RESULT REPEATED AND VERIFIED   HCT 40.9 40.0 - 52.0 %   MCV 64.5 (L) 80.0 - 100.0 fL   MCH 19.6 (L) 26.0 - 34.0 pg   MCHC 30.4 (L) 32.0 - 36.0 g/dL   RDW 22.3 (H) 11.5 - 14.5 %   Platelets 360 150 - 440 K/uL   No results found for this  or any previous visit (from the past 240 hour(s)). Creatinine:  Recent Labs  06/05/16 0459 06/06/16 0516 06/07/16 0436 06/08/16 0614 06/09/16 0414 06/10/16 0535 06/11/16 0432  CREATININE 1.85* 1.70* 1.72* 1.69* 1.88* 1.69* 1.76*   Baseline Creatinine: 1.5  Impression/Assessment:  80yo with urinary retention and gross hematuria  Plan:  1. Urinary retention: 34F 3 way foley catheter placed and patient was started on continuous bladder irrigation. Please continue foley catheter until the patient has completed his lasix therapy  2. Gross hematuria: The patient was started on continuous bladder irrigation. Please irrigate PRN for clots. Heamturia related to foley trauma versus UTI versus hemorrhagic cystitis. Please start patient on broad spectrum antibiotics for presumed UTI. Urology to continue to follow  Nicolette Bang 06/11/2016, 10:30 AM

## 2016-06-12 DIAGNOSIS — R310 Gross hematuria: Secondary | ICD-10-CM

## 2016-06-12 LAB — CBC
HEMATOCRIT: 40.1 % (ref 40.0–52.0)
HEMOGLOBIN: 12.1 g/dL — AB (ref 13.0–18.0)
MCH: 19.2 pg — ABNORMAL LOW (ref 26.0–34.0)
MCHC: 30.2 g/dL — ABNORMAL LOW (ref 32.0–36.0)
MCV: 63.6 fL — AB (ref 80.0–100.0)
Platelets: 382 10*3/uL (ref 150–440)
RBC: 6.32 MIL/uL — ABNORMAL HIGH (ref 4.40–5.90)
RDW: 22.2 % — ABNORMAL HIGH (ref 11.5–14.5)
WBC: 30.2 10*3/uL — AB (ref 3.8–10.6)

## 2016-06-12 LAB — BASIC METABOLIC PANEL
ANION GAP: 10 (ref 5–15)
BUN: 85 mg/dL — ABNORMAL HIGH (ref 6–20)
CO2: 37 mmol/L — ABNORMAL HIGH (ref 22–32)
CREATININE: 1.74 mg/dL — AB (ref 0.61–1.24)
Calcium: 9.2 mg/dL (ref 8.9–10.3)
Chloride: 91 mmol/L — ABNORMAL LOW (ref 101–111)
GFR calc Af Amer: 41 mL/min — ABNORMAL LOW (ref >60)
GFR, EST NON AFRICAN AMERICAN: 36 mL/min — AB (ref >60)
Glucose, Bld: 115 mg/dL — ABNORMAL HIGH (ref 65–99)
Potassium: 3.8 mmol/L (ref 3.5–5.1)
Sodium: 138 mmol/L (ref 135–145)

## 2016-06-12 MED ORDER — POTASSIUM CHLORIDE CRYS ER 20 MEQ PO TBCR
20.0000 meq | EXTENDED_RELEASE_TABLET | Freq: Two times a day (BID) | ORAL | Status: DC
  Administered 2016-06-12 – 2016-06-14 (×4): 20 meq via ORAL
  Filled 2016-06-12 (×4): qty 1

## 2016-06-12 MED ORDER — MORPHINE SULFATE (PF) 4 MG/ML IV SOLN
2.0000 mg | Freq: Once | INTRAVENOUS | Status: AC
  Administered 2016-06-12: 2 mg via INTRAVENOUS
  Filled 2016-06-12: qty 1

## 2016-06-12 MED ORDER — TAMSULOSIN HCL 0.4 MG PO CAPS
0.4000 mg | ORAL_CAPSULE | Freq: Every day | ORAL | Status: DC
  Administered 2016-06-12 – 2016-06-15 (×4): 0.4 mg via ORAL
  Filled 2016-06-12 (×4): qty 1

## 2016-06-12 NOTE — Progress Notes (Signed)
Physical Therapy Treatment Patient Details Name: Nathan Kramer MRN: [id number] DOB: 1935-12-18 Today's Date: 06/12/2016    History of Present Illness Nathan Kramer is a 81 y.o. male with a known history of congestive heart failure, chronic kidney disease, anemia, prostate cancer, hypertension, CVA presented to the emergency room with swelling in both the lower legs along with redness and weeping lesions in the lower extremities. Patient has serous sanguineous fluid weeping from the lower extremity skin.  He also complained of shortness of breath for the last couple of weeks. Has history of orthopnea. He was evaluated in the emergency room his BNP was elevated. He was given IV Lasix for diuresis and IV vancomycin and divided was started for lower extremity cellulitis. Troponin was elevated but patient also had elevation in creatinine. Elevated troponin could be secondary to demand ischemia and renal insufficiency. EKG normal sinus rhythm with no ST segment elevation. Patient was put on oxygen via nasal cannula in the emergency room. Hospitalist service was consulted for further care of the patient. Pt is now admitted for acute on chronic CHF with history of moderate/severe aortic stenosis. He is eager to work with physical therapy on this date. Pt currently on CBI Foley.     PT Comments    Pt awake and agreeable to participate in PT treatment. Pt able to transfer and perform bed mobility under PT supervision and immediately required use of BSC when standing, he requires some assistance with hygiene following toileting. Pt then began ambulating w/ RW but stated he needed to use the bathroom ASAP and was able to safely transfer to toilet under PT supervision for a second BM. Pt then ambulated around nursing station w/ RW and min guarding for safety. He displayed improved activity tolerance and did not require a resting break during ambulation and O2 stayed above 96% on 3 L/min. Pt appears to be  progressing from functional standpoint, he still displays decreased balance and activity tolerance that limit his functional mobility and will continue to benefit from skilled PT. Continue to recommend HHPT following acute hospital stay.   Follow Up Recommendations  Home health PT     Equipment Recommendations  Rolling walker with 5" wheels    Recommendations for Other Services       Precautions / Restrictions Precautions Precautions: Fall Restrictions Weight Bearing Restrictions: No    Mobility  Bed Mobility Overal bed mobility: Modified Independent             General bed mobility comments: pt able to move to sitting w/ increased time and use of bedrails  Transfers Overall transfer level: Needs assistance Equipment used: Rolling walker (2 wheeled) Transfers: Sit to/from Stand Sit to Stand: Supervision         General transfer comment: pt leans forward and requires use of bilat UE to move into standing   Ambulation/Gait Ambulation/Gait assistance: Supervision Ambulation Distance (Feet): 250 Feet Assistive device: Rolling walker (2 wheeled) Gait Pattern/deviations: Decreased stride length;Trunk flexed Gait velocity: Decreased but functional for full household mobility   General Gait Details: pt ambulated around nursing station w/o rest breaks on 3 L/min of O2, sat stayed above 96% and no SOB or dyspnea, pt able to communicate during ambulation, requires use of RW for additional stability   Stairs            Wheelchair Mobility    Modified Rankin (Stroke Patients Only)       Balance Overall balance assessment: Needs assistance Sitting-balance support: No upper  extremity supported;Feet supported Sitting balance-Leahy Scale: Good Sitting balance - Comments: able to maintain seated posture w/o back support slightly forwardly flexed    Standing balance support: Bilateral upper extremity supported;During functional activity Standing balance-Leahy Scale:  Good Standing balance comment: RW for additional stability in standing, forwardly flexed throughout standing                     Cognition Arousal/Alertness: Awake/alert Behavior During Therapy: WFL for tasks assessed/performed Overall Cognitive Status: Within Functional Limits for tasks assessed                      Exercises Other Exercises Other Exercises: therapeutic activities; toileting and hygiene, transfers x2 to [facility] and toilet, requires min guarding to transfer, assist for hygiene(wiping)     General Comments        Pertinent Vitals/Pain Pain Assessment: Faces Faces Pain Scale: Hurts whole lot Pain Location: pennile region Pain Descriptors / Indicators: Aching;Burning;Grimacing Pain Intervention(s): Monitored during session;Premedicated before session    Home Living                      Prior Function            PT Goals (current goals can now be found in the care plan section) Acute Rehab PT Goals Patient Stated Goal: Return to prior level of function at home. "I want to live a long time." PT Goal Formulation: With patient Time For Goal Achievement: 06/19/16 Potential to Achieve Goals: Good Progress towards PT goals: Progressing toward goals    Frequency    Min 2X/week      PT Plan Current plan remains appropriate    Co-evaluation             End of Session Equipment Utilized During Treatment: Gait belt;Oxygen (3L/min) Activity Tolerance: Patient tolerated treatment well Patient left: in chair;with call bell/phone within reach;with chair alarm set Nurse Communication: Mobility status PT Visit Diagnosis: Unsteadiness on feet (R26.81);Muscle weakness (generalized) (M62.81)     Time: 0240-9735 PT Time Calculation (min) (ACUTE ONLY): 44 min  Charges:                       G Codes:       [employer] Student PT 06/12/2016, 2:57 PM

## 2016-06-12 NOTE — Progress Notes (Signed)
06/12/16  Urology Consult Follow Up  Subjective: Urine clear today with CBI off. Ambulating with PT.  C/o bladder spasms.    Anti-infectives: Anti-infectives    Start     Dose/Rate Route Frequency Ordered Stop   06/10/16 2000  cefTRIAXone (ROCEPHIN) 1 g in dextrose 5 % 50 mL IVPB     1 g 100 mL/hr over 30 Minutes Intravenous Every 24 hours 06/10/16 1913 06/15/16 1959   06/01/16 1400  vancomycin (VANCOCIN) IVPB 750 mg/150 ml premix  Status:  Discontinued     750 mg 150 mL/hr over 60 Minutes Intravenous Every 24 hours 06/01/16 0414 06/03/16 1211   06/01/16 0300  vancomycin (VANCOCIN) IVPB 1000 mg/200 mL premix  Status:  Discontinued     1,000 mg 200 mL/hr over 60 Minutes Intravenous  Once 06/01/16 0248 06/01/16 0251   06/01/16 0000  vancomycin (VANCOCIN) IVPB 1000 mg/200 mL premix     1,000 mg 200 mL/hr over 60 Minutes Intravenous  Once 05/31/16 2351 06/01/16 0223      Current Facility-Administered Medications  Medication Dose Route Frequency Provider Last Rate Last Dose  . 0.9 %  sodium chloride infusion  250 mL Intravenous PRN Saundra Shelling, MD      . acetaminophen (TYLENOL) tablet 650 mg  650 mg Oral Q6H PRN Saundra Shelling, MD   650 mg at 06/09/16 1646   Or  . acetaminophen (TYLENOL) suppository 650 mg  650 mg Rectal Q6H PRN Pavan Pyreddy, MD      . albumin human 25 % solution 12.5 g  12.5 g Intravenous Daily Harmeet Singh, MD   12.5 g at 06/12/16 0831  . alum & mag hydroxide-simeth (MAALOX/MYLANTA) 200-200-20 MG/5ML suspension 30 mL  30 mL Oral Q6H PRN Bettey Costa, MD   30 mL at 06/08/16 1831  . bisacodyl (DULCOLAX) suppository 10 mg  10 mg Rectal Daily PRN Bettey Costa, MD   10 mg at 06/06/16 1300  . cefTRIAXone (ROCEPHIN) 1 g in dextrose 5 % 50 mL IVPB  1 g Intravenous Q24H Henreitta Leber, MD   1 g at 06/11/16 2111  . docusate sodium (COLACE) capsule 200 mg  200 mg Oral BID Bettey Costa, MD   200 mg at 06/12/16 0830  . feeding supplement (ENSURE ENLIVE) (ENSURE ENLIVE) liquid 237  mL  237 mL Oral TID BM Henreitta Leber, MD   237 mL at 06/12/16 0830  . ferrous gluconate (FERGON) tablet 324 mg  324 mg Oral BID WC Pavan Pyreddy, MD   324 mg at 06/12/16 0830  . furosemide (LASIX) 250 mg in dextrose 5 % 250 mL (1 mg/mL) infusion  5 mg/hr Intravenous Continuous Munsoor Lateef, MD 10 mL/hr at 06/11/16 1645 10 mg/hr at 06/11/16 1645  . HYDROcodone-acetaminophen (NORCO/VICODIN) 5-325 MG per tablet 1-2 tablet  1-2 tablet Oral Q4H PRN Saundra Shelling, MD   2 tablet at 06/12/16 0830  . levothyroxine (SYNTHROID, LEVOTHROID) tablet 25 mcg  25 mcg Oral QAC breakfast Saundra Shelling, MD   25 mcg at 06/12/16 0830  . LORazepam (ATIVAN) tablet 0.5 mg  0.5 mg Oral Q6H PRN Bettey Costa, MD      . morphine (MS CONTIN) 12 hr tablet 30 mg  30 mg Oral TID WC Saundra Shelling, MD   30 mg at 06/12/16 1311  . ondansetron (ZOFRAN) tablet 4 mg  4 mg Oral Q6H PRN Saundra Shelling, MD       Or  . ondansetron (ZOFRAN) injection 4 mg  4 mg Intravenous  Q6H PRN Saundra Shelling, MD      . opium-belladonna (B&O SUPPRETTES) 16.2-60 MG suppository 1 suppository  1 suppository Rectal Q8H PRN Bettey Costa, MD   1 suppository at 06/12/16 1107  . phenazopyridine (PYRIDIUM) tablet 100 mg  100 mg Oral TID PRN Harrie Foreman, MD   100 mg at 06/12/16 1107  . polyethylene glycol (MIRALAX / GLYCOLAX) packet 17 g  17 g Oral Daily Bettey Costa, MD   17 g at 06/12/16 0829  . potassium chloride SA (K-DUR,KLOR-CON) CR tablet 20 mEq  20 mEq Oral BID Henreitta Leber, MD      . senna-docusate (Senokot-S) tablet 1 tablet  1 tablet Oral QHS PRN Saundra Shelling, MD   1 tablet at 06/04/16 2232  . sodium chloride (OCEAN) 0.65 % nasal spray 1 spray  1 spray Each Nare PRN Sital Mody, MD      . sodium chloride flush (NS) 0.9 % injection 3 mL  3 mL Intravenous Q12H Pavan Pyreddy, MD   3 mL at 06/12/16 1000  . sodium chloride flush (NS) 0.9 % injection 3 mL  3 mL Intravenous PRN Saundra Shelling, MD   3 mL at 06/09/16 1125  . spironolactone (ALDACTONE) tablet  25 mg  25 mg Oral Daily Murlean Iba, MD   25 mg at 06/12/16 0829  . tamsulosin (FLOMAX) capsule 0.4 mg  0.4 mg Oral QPC supper Cleon Gustin, MD         Objective: Vital signs in last 24 hours: Temp:  [97.8 F (36.6 C)-98.3 F (36.8 C)] 98.3 F (36.8 C) (03/12 1117) Pulse Rate:  [77-96] 90 (03/12 1117) Resp:  [16-20] 20 (03/12 1117) BP: (99-121)/(53-57) 119/55 (03/12 1117) SpO2:  [96 %-99 %] 99 % (03/12 1117) Weight:  [166 lb 8 oz (75.5 kg)] 166 lb 8 oz (75.5 kg) (03/12 0511)  Intake/Output from previous day: 03/11 0701 - 03/12 0700 In: 76720 [P.O.:240; I.V.:230; IV Piggyback:100] Out: 94709 [Urine:19050] Intake/Output this shift: Total I/O In: 240 [P.O.:240] Out: -    Physical Exam General:alert, fail, ambulating with assistance,  GI: soft, non tender, normal bowel sounds, no palpable masses, no organomegaly GU: Foley draining brown tinged urine, no clots, CBI off Extremities: edema 2+  Lab Results:   Recent Labs  06/11/16 0432 06/12/16 0411  WBC 32.7* 30.2*  HGB 12.5* 12.1*  HCT 40.9 40.1  PLT 360 382   BMET  Recent Labs  06/11/16 0432 06/12/16 0411  NA 136 138  K 4.2 3.8  CL 90* 91*  CO2 34* 37*  GLUCOSE 121* 115*  BUN 81* 85*  CREATININE 1.76* 1.74*  CALCIUM 9.0 9.2   Studies/Results: Dg Abd 1 View  Result Date: 06/11/2016 CLINICAL DATA:  History of constipation and abdominal pain EXAM: ABDOMEN - 1 VIEW COMPARISON:  06/10/2016 FINDINGS: Scattered large and small bowel gas is noted. Fecal material is noted within the colon similar to that seen on recent CT. No free air is noted. No abnormal mass is seen. Degenerative change of the lumbar spine is noted. IMPRESSION: No acute abnormality noted. Electronically Signed   By: Inez Catalina M.D.   On: 06/11/2016 08:34     Assessment/Plan: 80yo with gross hematuria and urinary retention, bleeding resolved  1. Gross hematuria: Resolved, CBI now off.  OK to cap CBI port.    2. Urinary  retention: Voiding trial in 7 days, will arrange for outpatient follow up for voiding trial.    3. Bladder spasm: Continue  B&O suppositories as needed  Urology will sign off for now, please contact with question or concerns.     LOS: 11 days    Hollice Espy 06/12/2016

## 2016-06-12 NOTE — Progress Notes (Signed)
[facility]  ROUNDING NOTE   Subjective:  Patient remains on continuous bladder irrigation. Hematuria appears to be clearing up. Remains on Lasix drip at 10 mg per hour.   Objective:  Vital signs in last 24 hours:  Temp:  [97.8 F (36.6 C)-98.3 F (36.8 C)] 98.3 F (36.8 C) (03/12 1117) Pulse Rate:  [77-96] 90 (03/12 1117) Resp:  [16-20] 20 (03/12 1117) BP: (99-121)/(53-57) 119/55 (03/12 1117) SpO2:  [96 %-99 %] 99 % (03/12 1117) Weight:  [75.5 kg (166 lb 8 oz)] 75.5 kg (166 lb 8 oz) (03/12 0511)  Weight change: -1.27 kg (-2 lb 12.8 oz) Filed Weights   06/10/16 0405 06/11/16 0340 06/12/16 0511  Weight: 77.2 kg (170 lb 3.2 oz) 76.8 kg (169 lb 4.8 oz) 75.5 kg (166 lb 8 oz)    Intake/Output: I/O last 3 completed shifts: In: 83151 [P.O.:480; I.V.:350; Other:30500; IV Piggyback:250] Out: 76160 [Urine:30750]   Intake/Output this shift:  No intake/output data recorded.  Physical Exam: General: NAD  Head: Moist oral mucosal membranes, reddish tongue  Eyes: Anicteric  Neck: Supple, trachea midline, +JVD  Lungs:  Scattered rhonchi, normal effort   Heart: +3/6 crescendo murmur, irregular rhythm  Abdomen:  Soft, nontender, Distended, abdominal wall edema ++  Extremities: UNNA Boots bilaterally, 2+ peripheral edema up to lower abdomen  Neurologic: Nonfocal, moving all four extremities  GU: Foley in place        Basic Metabolic Panel:  Recent Labs Lab 06/08/16 0614 06/09/16 0414 06/10/16 0535 06/11/16 0432 06/12/16 0411  NA 140 138 139 136 138  K 4.2 4.3 4.2 4.2 3.8  CL 96* 93* 90* 90* 91*  CO2 33* 34* 37* 34* 37*  GLUCOSE 134* 105* 120* 121* 115*  BUN 70* 79* 81* 81* 85*  CREATININE 1.69* 1.88* 1.69* 1.76* 1.74*  CALCIUM 9.3 9.6 9.5 9.0 9.2    Liver Function Tests: No results for input(s): AST, ALT, ALKPHOS, BILITOT, PROT, ALBUMIN in the last 168 hours. No results for input(s): LIPASE, AMYLASE in the last 168 hours. No results for input(s):  AMMONIA in the last 168 hours.  CBC:  Recent Labs Lab 06/08/16 0614 06/09/16 0414 06/10/16 0535 06/11/16 0432 06/12/16 0411  WBC 28.4* 26.5* 26.9* 32.7* 30.2*  HGB 13.1 12.9* 12.7* 12.5* 12.1*  HCT 44.8 43.9 43.0 40.9 40.1  MCV 65.3* 64.4* 64.3* 64.5* 63.6*  PLT 363 384 382 360 382    Cardiac Enzymes: No results for input(s): CKTOTAL, CKMB, CKMBINDEX, TROPONINI in the last 168 hours.  BNP: Invalid input(s): POCBNP  CBG:  Recent Labs Lab 06/07/16 0748 06/10/16 0804  GLUCAP 104* 116*    Microbiology: Results for orders placed or performed during the hospital encounter of 05/31/16  Blood culture (routine x 2)     Status: None   Collection Time: 06/01/16 12:20 AM  Result Value Ref Range Status   Specimen Description BLOOD  L WRIST  Final   Special Requests BOTTLES DRAWN AEROBIC AND ANAEROBIC  ADEQUATE   Final   Culture NO GROWTH 5 DAYS  Final   Report Status 06/06/2016 FINAL  Final  Blood culture (routine x 2)     Status: None   Collection Time: 06/01/16  1:11 AM  Result Value Ref Range Status   Specimen Description BLOOD  R FOREARM  Final   Special Requests BOTTLES DRAWN AEROBIC AND ANAEROBIC  ADEQUATE  Final   Culture NO GROWTH 5 DAYS  Final   Report Status 06/06/2016 FINAL  Final  Coagulation Studies: No results for input(s): LABPROT, INR in the last 72 hours.  Urinalysis:  Recent Labs  06/09/16 2330  PROTEINUR TEST NOT REPORTED DUE TO COLOR INTERFERENCE OF URINE PIGMENT*  NITRITE TEST NOT REPORTED DUE TO COLOR INTERFERENCE OF URINE PIGMENT*  LEUKOCYTESUR TEST NOT REPORTED DUE TO COLOR INTERFERENCE OF URINE PIGMENT*      Imaging: Dg Abd 1 View  Result Date: 06/11/2016 CLINICAL DATA:  History of constipation and abdominal pain EXAM: ABDOMEN - 1 VIEW COMPARISON:  06/10/2016 FINDINGS: Scattered large and small bowel gas is noted. Fecal material is noted within the colon similar to that seen on recent CT. No free air is noted. No abnormal mass is seen.  Degenerative change of the lumbar spine is noted. IMPRESSION: No acute abnormality noted. Electronically Signed   By: Inez Catalina M.D.   On: 06/11/2016 08:34     Medications:   . furosemide (LASIX) infusion 10 mg/hr (06/11/16 1645)   . albumin human  12.5 g Intravenous Daily  . cefTRIAXone (ROCEPHIN) IVPB 1 gram/50 mL D5W  1 g Intravenous Q24H  . docusate sodium  200 mg Oral BID  . feeding supplement (ENSURE ENLIVE)  237 mL Oral TID BM  . ferrous gluconate  324 mg Oral BID WC  . levothyroxine  25 mcg Oral QAC breakfast  . morphine  30 mg Oral TID WC  . polyethylene glycol  17 g Oral Daily  . potassium chloride  20 mEq Oral BID  . sodium chloride flush  3 mL Intravenous Q12H  . spironolactone  25 mg Oral Daily  . tamsulosin  0.4 mg Oral QPC supper   sodium chloride, acetaminophen **OR** acetaminophen, alum & mag hydroxide-simeth, bisacodyl, HYDROcodone-acetaminophen, LORazepam, ondansetron **OR** ondansetron (ZOFRAN) IV, opium-belladonna, phenazopyridine, senna-docusate, sodium chloride, sodium chloride flush  Assessment/ Plan:  Mr. CARSTON RIEDL is a 81 y.o. white male with diastolic congestive heart failure, hypertension, CVA, BPH, , who was admitted to [facility] on 05/31/2016   1. Acute renal failure on chronic kidney disease stage III: with proteinuria, anasarca.  Chronic kidney disease secondary to hypertension. Acute renal failure from acute cardiorenal syndrome from diastolic congestive heart failure.  - Creatinine appears to be stable however BUN is still quite high at 85. We will lower Lasix drip to 5 milligrams per hour.   2.  Anasarca/generalized edema; Cardiac - aortic stenosis. Gr 2 diastolic dysfunction; EF 70-92%, Pulm HTN and RV pressure overload - Continue supportive care with Unna boots, Lasix drip, IV albumin.       LOS: 11 Talecia Sherlin 3/12/20182:30 PM

## 2016-06-12 NOTE — Progress Notes (Signed)
[place] [facility] [place] NAME: Nathan Kramer    MR#:  [id number]  DATE OF BIRTH:  07/27/1935  SUBJECTIVE:   Still complaining of significant bladder spasms this a.m. Got some Morphine this a.m. Remains on Lasix gtt and diuresing. Prognosis is poor.   REVIEW OF SYSTEMS:    Review of Systems  Constitutional: Negative for chills and fever.  HENT: Negative for congestion, nosebleeds and tinnitus.   Eyes: Negative for blurred vision and double vision.  Respiratory: Negative for cough, shortness of breath (better) and wheezing.   Cardiovascular: Positive for leg swelling. Negative for chest pain, orthopnea and PND.  Gastrointestinal: Negative for abdominal pain, blood in stool, constipation, diarrhea, melena, nausea and vomiting.  Genitourinary: Positive for hematuria. Negative for dysuria.       Scrotal swelling  Neurological: Negative for dizziness, sensory change and focal weakness.  Psychiatric/Behavioral: Negative for hallucinations, memory loss and suicidal ideas. The patient is not nervous/anxious.   All other systems reviewed and are negative.   Nutrition: heart Healthy Tolerating Diet: Yes Tolerating PT: Home health at discharge.    DRUG ALLERGIES:   Allergies  Allergen Reactions  . Amoxicillin     Other reaction(s): Localized superficial swelling of skin Face swelling   . Reglan [Metoclopramide]     Involuntary movements body    VITALS:  Blood pressure (!) 119/55, pulse 90, temperature 98.3 F (36.8 C), resp. rate 20, height 5\' 8"  (1.727 m), weight 75.5 kg (166 lb 8 oz), SpO2 99 %.  PHYSICAL EXAMINATION:   Physical Exam  GENERAL:  81 y.o.-year-old patient lying in bed in mild acute distress due to bladder spasms.  EYES: Legally blind. No scleral icterus. Extraocular muscles intact.  HEENT: Head atraumatic, normocephalic. Oropharynx and nasopharynx clear.  +JVD improving NECK:  Supple, no jugular venous distention. No  thyroid enlargement, no tenderness.  LUNGS:Clear to auscultation no wheezing, bibasilar rales, no rhonchi. No use of accessory muscles of respiration.  CARDIOVASCULAR: S1, S2 normal.3/6 SEM at LSB, No rubs, or gallops.  ABDOMEN: Soft, nontender, ND Bowel sounds present. No organomegaly or mass.  EXTREMITIES: No cyanosis, clubbing, + 2 edema b/l up to scrotum.  Legs wrapped in UNNA boots.   NEUROLOGIC: Cranial nerves II through XII are intact. No focal Motor or sensory deficits b/l. Globally weak. PSYCHIATRIC: The patient is alert and oriented x 3.  SKIN: No obvious rash, lesion, LE wrapped in unna boots.   LABORATORY PANEL:   CBC  Recent Labs Lab 06/12/16 0411  WBC 30.2*  HGB 12.1*  HCT 40.1  PLT 382   ------------------------------------------------------------------------------------------------------------------  Chemistries   Recent Labs Lab 06/12/16 0411  NA 138  K 3.8  CL 91*  CO2 37*  GLUCOSE 115*  BUN 85*  CREATININE 1.74*  CALCIUM 9.2   ------------------------------------------------------------------------------------------------------------------  Cardiac Enzymes No results for input(s): TROPONINI in the last 168 hours. ------------------------------------------------------------------------------------------------------------------  RADIOLOGY:  Dg Abd 1 View  Result Date: 06/11/2016 CLINICAL DATA:  History of constipation and abdominal pain EXAM: ABDOMEN - 1 VIEW COMPARISON:  06/10/2016 FINDINGS: Scattered large and small bowel gas is noted. Fecal material is noted within the colon similar to that seen on recent CT. No free air is noted. No abnormal mass is seen. Degenerative change of the lumbar spine is noted. IMPRESSION: No acute abnormality noted. Electronically Signed   By: Inez Catalina M.D.   On: 06/11/2016 08:34     ASSESSMENT AND PLAN:   81 year old male with  past medical history of hypertension, pulmonary hypertension, diastolic CHF, moderate  aortic stenosis, history of GERD, prostate cancer who presented to the hospital due to shortness of breath and worsening lower extremity edema.  1.Acute on chronic diastolic dysfunction with underlying pulmonary hypertension/anasarca: Still with edema and signs of fluid overload, however much improved now that he is on increased dose of Lasix. - Continue Lasix drip with albumin as per nephrology Appreciate cardiology and nephrology consultation.  Follow I's and O's and daily weights. About 4 L (-) since admission and slow to improve.   2. Hematuria: Patient now has [place] urology consultation. Hg. Stable.  - cont. CBI for now.  She having significant bladder spasms. Continue belladonna, Pyridium - Further management as per UROLOGY.  3. Moderate/Severe  Aortic Stenosis contributing to CHF: As per Cardiology, he is a poor surgical candidate.   Repeat Echo shows no new changes.   He will follow up with Cardiology as outpatient.   4. CKD Stage III: Creatinine remains stable -Continue to monitor BMP - Nephrology consult appreciated.   5. Leukocytosis - unclear etiology of leukocytosis.  Patient is without fever since admission  Urine analysis and chest x-ray was negative for infection. WBC increasing but no clear source and will cont. To monitor. Follow up with Hem/Onc as outpatient.   6. Hypothyroidism: Continue Synthroid.  7. LE edema due to CHF and venous stasis - s/p UNNA boots as per wound team.  - cont. Lasix.    8. Elevated troponin due to demand ischemia and not ACS  CODE STATUS: DNR  Prognosis is poor and consider Palliative care consult.   TOTAL TIME TAKING CARE OF THIS PATIENT: 30 minutes.   POSSIBLE D/C IN 3-4 DAYS, DEPENDING ON CLINICAL CONDITION.   Henreitta Leber M.D on 06/12/2016 at 2:28 PM  Between 7am to 6pm - Pager - [phone]  After 6pm go to [email or web address] - [employer] [facility]  Office   [phone]  CC: Primary care physician; Tracie Harrier, MD

## 2016-06-12 NOTE — Care Management (Signed)
Patient still requiring lasix drip.  Now requiring CBI. Requested repeat overnight continuous pulse ox when closer to discharge date, as results are only valid for 48 hours.

## 2016-06-12 NOTE — Progress Notes (Signed)
Subjective: Urine is light pink on slow drip CBI. 100cc of clot irrigated from foley last night. Pt notes improvement in suprapubic pain.  Objective: Vital signs in last 24 hours: Temp:  [97.4 F (36.3 C)-98.3 F (36.8 C)] 98.3 F (36.8 C) (03/12 0511) Pulse Rate:  [77-95] 77 (03/12 0511) Resp:  [16-22] 16 (03/12 0511) BP: (99-121)/(53-63) 99/53 (03/12 0511) SpO2:  [96 %-100 %] 96 % (03/12 0511) Weight:  [75.5 kg (166 lb 8 oz)] 75.5 kg (166 lb 8 oz) (03/12 0511)  Intake/Output from previous day: 03/11 0701 - 03/12 0700 In: 97989 [P.O.:240; I.V.:110; IV Piggyback:100] Out: 18600 [Urine:18600] Intake/Output this shift: Total I/O In: 50 [IV Piggyback:50] Out: 3500 [Urine:3500]  Physical Exam:  General:alert, cooperative and appears stated age GI: soft, non tender, normal bowel sounds, no palpable masses, no organomegaly, no inguinal hernia Male genitalia: Penis: swelling Extremities: edema 2+  Lab Results:  Recent Labs  06/10/16 0535 06/11/16 0432 06/12/16 0411  HGB 12.7* 12.5* 12.1*  HCT 43.0 40.9 40.1   BMET  Recent Labs  06/11/16 0432 06/12/16 0411  NA 136 138  K 4.2 3.8  CL 90* 91*  CO2 34* 37*  GLUCOSE 121* 115*  BUN 81* 85*  CREATININE 1.76* 1.74*  CALCIUM 9.0 9.2   No results for input(s): LABPT, INR in the last 72 hours. No results for input(s): LABURIN in the last 72 hours. Results for orders placed or performed during the hospital encounter of 05/31/16  Blood culture (routine x 2)     Status: None   Collection Time: 06/01/16 12:20 AM  Result Value Ref Range Status   Specimen Description BLOOD  L WRIST  Final   Special Requests BOTTLES DRAWN AEROBIC AND ANAEROBIC  ADEQUATE   Final   Culture NO GROWTH 5 DAYS  Final   Report Status 06/06/2016 FINAL  Final  Blood culture (routine x 2)     Status: None   Collection Time: 06/01/16  1:11 AM  Result Value Ref Range Status   Specimen Description BLOOD  R FOREARM  Final   Special Requests BOTTLES  DRAWN AEROBIC AND ANAEROBIC  ADEQUATE  Final   Culture NO GROWTH 5 DAYS  Final   Report Status 06/06/2016 FINAL  Final    Studies/Results: Ct Abdomen Pelvis Wo Contrast  Result Date: 06/10/2016 CLINICAL DATA:  Catheter removed, now with hematuria EXAM: CT ABDOMEN AND PELVIS WITHOUT CONTRAST TECHNIQUE: Multidetector CT imaging of the abdomen and pelvis was performed following the standard protocol without IV contrast. COMPARISON:  CT abdomen pelvis - 01/11/2016 FINDINGS: The lack of intravenous contrast limits the ability to evaluate solid abdominal organs. Lower chest: Evaluation of lower thorax is degraded secondary to patient respiratory artifact. Limited visualization of the lower thorax demonstrates trace/small bilateral effusions with associated subsegmental atelectasis within the imaged bilateral lung bases. Normal heart size. Coronary artery calcifications. Calcifications within the aortic valve leaflets and mitral valve annulus. Small amount of pericardial fluid, presumably physiologic. Hepatobiliary: Normal hepatic contour. Punctate calcification with the caudate, likely the sequela of prior glomus infection. Normal noncontrast appearance of the gallbladder given degree distention. No radiopaque gallstones. Trace amount of intra-abdominal ascites. Pancreas: Largely fatty replacement of the pancreas. Spleen: Splenomegaly with the spleen measuring 18 cm in length, similar to the 01/2016 examination. Adrenals/Urinary Tract: Bilateral nonobstructing nephrolithiasis with dominant right-sided nonobstructing renal stone within the posterior interpolar aspect the right kidney measuring 1 cm in diameter (53, series 2 and index nonobstructing stone within the inferior pole the  left kidney measuring 0.5 cm (image 60, series 2). No renal stones are seen along expected course of either ureter or the urinary bladder. Normal noncontrast appearance the urinary bladder given underdistention. Multiple bilateral  predominately exophytic hypoattenuating renal lesions are seen with index exophytic lesion arising from the anterior inferior aspect the left kidney measuring 4.9 cm in diameter. Renal lesions are incompletely characterized on this noncontrast examination though appear morphologically similar to the 01/2016 examination are favored to represent renal cysts. Normal noncontrast appearance of the bilateral adrenal glands. Stomach/Bowel: Moderate colonic stool burden without evidence of enteric obstruction. Normal noncontrast appearance of the terminal ileum and retrocecal appendix. No pneumoperitoneum, pneumatosis or portal venous gas. Vascular/Lymphatic: Moderate amount of eccentric calcified plaque within a tortuous but normal caliber abdominal aorta. Scattered retroperitoneal and mesenteric lymph nodes are numerous though individually not enlarged by size criteria. No definitive bulky retroperitoneal, mesenteric, pelvic or inguinal lymphadenopathy on this noncontrast examination. Reproductive: Dystrophic calcifications within normal sized prostate gland. Small amount of fluid within the pelvic cul-de-sac. Scrotal edema. Other: Large amount of body wall subcutaneous edema. Musculoskeletal: No acute or aggressive osseous abnormalities. Mild-to-moderate multilevel lumbar spine DDD, worse at L2-L3 with disc space height loss, endplate irregularity and sclerosis. Stigmata of DISH with the caudal aspect of the thoracic spine. IMPRESSION: 1. No explanation for patient's hematuria. Specifically, normal noncontrast appearance of the urinary bladder given degree of distention. No evidence of urinary obstruction. 2. Small bilateral effusions, small amount of intra-abdominal ascites and a large amount of body wall subcutaneous edema, findings are nonspecific though favored to represent congestive heart failure. Clinical correlation is advised. 3. Similar findings of splenomegaly without definitive morphologic changes of  cirrhosis on this noncontrast exam. 4. Bilateral nonobstructing nephrolithiasis. 5. Grossly unchanged bilateral hypoattenuating renal lesions, incompletely characterized on this noncontrast examination though morphologically similar to the 01/2016 exam and favored to represent renal cysts. 6. Coronary artery calcifications. Aortic Atherosclerosis (ICD10-170.0) Electronically Signed   By: Sandi Mariscal M.D.   On: 06/10/2016 11:04   Dg Abd 1 View  Result Date: 06/11/2016 CLINICAL DATA:  History of constipation and abdominal pain EXAM: ABDOMEN - 1 VIEW COMPARISON:  06/10/2016 FINDINGS: Scattered large and small bowel gas is noted. Fecal material is noted within the colon similar to that seen on recent CT. No free air is noted. No abnormal mass is seen. Degenerative change of the lumbar spine is noted. IMPRESSION: No acute abnormality noted. Electronically Signed   By: Inez Catalina M.D.   On: 06/11/2016 08:34    Assessment/Plan: 80yo with gross hematuria and urinary retention  1. Gross hematuria: Improved significantly since foley catheter placement. Please continue to wean CBI to off  2. Urinary retention: Voiding trial in 7 days   LOS: 11 days   Nicolette Bang 06/12/2016, 6:51 AM

## 2016-06-12 NOTE — Progress Notes (Signed)
Patient Name: Nathan Kramer Date of Encounter: 06/12/2016  Primary Cardiologist: [facility] Problem List     Principal Problem:   Acute on chronic diastolic CHF (congestive heart failure), NYHA class 3 (HCC) Active Problems:   Aortic stenosis, moderate   Acute kidney injury superimposed on CKD (HCC)   Dietary noncompliance   HTN (hypertension)   Cellulitis     Subjective   SOB about the same. No chest pain. Foley back in over the weekend with some irrigation that is being added to the I's & O's. Weight 166 pounds (admission weight 166 pounds).    Inpatient Medications    Scheduled Meds: . albumin human  12.5 g Intravenous Daily  . cefTRIAXone (ROCEPHIN) IVPB 1 gram/50 mL D5W  1 g Intravenous Q24H  . docusate sodium  200 mg Oral BID  . feeding supplement (ENSURE ENLIVE)  237 mL Oral TID BM  . ferrous gluconate  324 mg Oral BID WC  . levothyroxine  25 mcg Oral QAC breakfast  . morphine  30 mg Oral TID WC  . polyethylene glycol  17 g Oral Daily  . potassium chloride  10 mEq Oral BID  . sodium chloride flush  3 mL Intravenous Q12H  . spironolactone  25 mg Oral Daily  . tamsulosin  0.4 mg Oral QPC supper   Continuous Infusions: . furosemide (LASIX) infusion 10 mg/hr (06/11/16 1645)   PRN Meds: sodium chloride, acetaminophen **OR** acetaminophen, alum & mag hydroxide-simeth, bisacodyl, HYDROcodone-acetaminophen, LORazepam, ondansetron **OR** ondansetron (ZOFRAN) IV, opium-belladonna, phenazopyridine, senna-docusate, sodium chloride, sodium chloride flush   Vital Signs    Vitals:   06/11/16 1124 06/11/16 2141 06/12/16 0511 06/12/16 0830  BP: (!) 109/58 (!) 121/57 (!) 99/53 (!) 101/53  Pulse: 87 95 77 96  Resp: 19 18 16    Temp: 97.9 F (36.6 C) 97.8 F (36.6 C) 98.3 F (36.8 C)   TempSrc: Oral Oral Oral   SpO2: 99% 97% 96%   Weight:   166 lb 8 oz (75.5 kg)   Height:        Intake/Output Summary (Last 24 hours) at 06/12/16 0844 Last data filed at  06/12/16 0700  Gross per 24 hour  Intake            14560 ml  Output            17600 ml  Net            -3040 ml   Filed Weights   06/10/16 0405 06/11/16 0340 06/12/16 0511  Weight: 170 lb 3.2 oz (77.2 kg) 169 lb 4.8 oz (76.8 kg) 166 lb 8 oz (75.5 kg)    Physical Exam    GEN: Well nourished, well developed, in no acute distress.  HEENT: Grossly normal.  Neck: Supple, JVD remains elevated to the jaw, no carotid bruits, or masses. Cardiac: RRR, no murmurs, rubs, or gallops. No clubbing, cyanosis. Improved LE edema with 1-2+ edema along the bilateral thighs, sacrum, and scrotum.  Radials/DP/PT 2+ and equal bilaterally.  Respiratory:  Bibasilar crackles. GI: Soft, nontender, distended with anasarca, BS + x 4. MS: no deformity or atrophy. Skin: warm and dry, no rash. Neuro:  Strength and sensation are intact. Psych: AAOx3.  Normal affect.  Labs    CBC  Recent Labs  06/11/16 0432 06/12/16 0411  WBC 32.7* 30.2*  HGB 12.5* 12.1*  HCT 40.9 40.1  MCV 64.5* 63.6*  PLT 360 601   Basic Metabolic Panel  Recent Labs  06/11/16 0432 06/12/16 0411  NA 136 138  K 4.2 3.8  CL 90* 91*  CO2 34* 37*  GLUCOSE 121* 115*  BUN 81* 85*  CREATININE 1.76* 1.74*  CALCIUM 9.0 9.2   Liver Function Tests No results for input(s): AST, ALT, ALKPHOS, BILITOT, PROT, ALBUMIN in the last 72 hours. No results for input(s): LIPASE, AMYLASE in the last 72 hours. Cardiac Enzymes No results for input(s): CKTOTAL, CKMB, CKMBINDEX, TROPONINI in the last 72 hours. BNP Invalid input(s): POCBNP D-Dimer No results for input(s): DDIMER in the last 72 hours. Hemoglobin A1C No results for input(s): HGBA1C in the last 72 hours. Fasting Lipid Panel No results for input(s): CHOL, HDL, LDLCALC, TRIG, CHOLHDL, LDLDIRECT in the last 72 hours. Thyroid Function Tests No results for input(s): TSH, T4TOTAL, T3FREE, THYROIDAB in the last 72 hours.  Invalid input(s): FREET3  Telemetry    Sinus rhythm -  Personally Reviewed  ECG    n/a - Personally Reviewed  Radiology    Ct Abdomen Pelvis Wo Contrast  Result Date: 06/10/2016 CLINICAL DATA:  Catheter removed, now with hematuria EXAM: CT ABDOMEN AND PELVIS WITHOUT CONTRAST TECHNIQUE: Multidetector CT imaging of the abdomen and pelvis was performed following the standard protocol without IV contrast. COMPARISON:  CT abdomen pelvis - 01/11/2016 FINDINGS: The lack of intravenous contrast limits the ability to evaluate solid abdominal organs. Lower chest: Evaluation of lower thorax is degraded secondary to patient respiratory artifact. Limited visualization of the lower thorax demonstrates trace/small bilateral effusions with associated subsegmental atelectasis within the imaged bilateral lung bases. Normal heart size. Coronary artery calcifications. Calcifications within the aortic valve leaflets and mitral valve annulus. Small amount of pericardial fluid, presumably physiologic. Hepatobiliary: Normal hepatic contour. Punctate calcification with the caudate, likely the sequela of prior glomus infection. Normal noncontrast appearance of the gallbladder given degree distention. No radiopaque gallstones. Trace amount of intra-abdominal ascites. Pancreas: Largely fatty replacement of the pancreas. Spleen: Splenomegaly with the spleen measuring 18 cm in length, similar to the 01/2016 examination. Adrenals/Urinary Tract: Bilateral nonobstructing nephrolithiasis with dominant right-sided nonobstructing renal stone within the posterior interpolar aspect the right kidney measuring 1 cm in diameter (53, series 2 and index nonobstructing stone within the inferior pole the left kidney measuring 0.5 cm (image 60, series 2). No renal stones are seen along expected course of either ureter or the urinary bladder. Normal noncontrast appearance the urinary bladder given underdistention. Multiple bilateral predominately exophytic hypoattenuating renal lesions are seen with index  exophytic lesion arising from the anterior inferior aspect the left kidney measuring 4.9 cm in diameter. Renal lesions are incompletely characterized on this noncontrast examination though appear morphologically similar to the 01/2016 examination are favored to represent renal cysts. Normal noncontrast appearance of the bilateral adrenal glands. Stomach/Bowel: Moderate colonic stool burden without evidence of enteric obstruction. Normal noncontrast appearance of the terminal ileum and retrocecal appendix. No pneumoperitoneum, pneumatosis or portal venous gas. Vascular/Lymphatic: Moderate amount of eccentric calcified plaque within a tortuous but normal caliber abdominal aorta. Scattered retroperitoneal and mesenteric lymph nodes are numerous though individually not enlarged by size criteria. No definitive bulky retroperitoneal, mesenteric, pelvic or inguinal lymphadenopathy on this noncontrast examination. Reproductive: Dystrophic calcifications within normal sized prostate gland. Small amount of fluid within the pelvic cul-de-sac. Scrotal edema. Other: Large amount of body wall subcutaneous edema. Musculoskeletal: No acute or aggressive osseous abnormalities. Mild-to-moderate multilevel lumbar spine DDD, worse at L2-L3 with disc space height loss, endplate irregularity and sclerosis. Stigmata of DISH with the caudal aspect  of the thoracic spine. IMPRESSION: 1. No explanation for patient's hematuria. Specifically, normal noncontrast appearance of the urinary bladder given degree of distention. No evidence of urinary obstruction. 2. Small bilateral effusions, small amount of intra-abdominal ascites and a large amount of body wall subcutaneous edema, findings are nonspecific though favored to represent congestive heart failure. Clinical correlation is advised. 3. Similar findings of splenomegaly without definitive morphologic changes of cirrhosis on this noncontrast exam. 4. Bilateral nonobstructing nephrolithiasis.  5. Grossly unchanged bilateral hypoattenuating renal lesions, incompletely characterized on this noncontrast examination though morphologically similar to the 01/2016 exam and favored to represent renal cysts. 6. Coronary artery calcifications. Aortic Atherosclerosis (ICD10-170.0) Electronically Signed   By: Sandi Mariscal M.D.   On: 06/10/2016 11:04   Dg Abd 1 View  Result Date: 06/11/2016 CLINICAL DATA:  History of constipation and abdominal pain EXAM: ABDOMEN - 1 VIEW COMPARISON:  06/10/2016 FINDINGS: Scattered large and small bowel gas is noted. Fecal material is noted within the colon similar to that seen on recent CT. No free air is noted. No abnormal mass is seen. Degenerative change of the lumbar spine is noted. IMPRESSION: No acute abnormality noted. Electronically Signed   By: Inez Catalina M.D.   On: 06/11/2016 08:34    Cardiac Studies   2D Echocardiogram3.1.2018  Study Conclusions  - Left ventricle: The cavity size was normal. There was mild concentric hypertrophy. Systolic function was normal. The estimated ejection fraction was in the range of 50% to 55%. Wall motion was normal; there were no regional wall motion abnormalities. Features are consistent with a pseudonormal left ventricular filling pattern, with concomitant abnormal relaxation and increased filling pressure (grade 2 diastolic dysfunction). - Ventricular septum: The contour showed systolic flattening. These changes are consistent with RV pressure overload. - Aortic valve: There was moderate to severe stenosis. There was moderate regurgitation. Mean gradient (S): 22 mm Hg. Valve area (VTI): 0.91 cm^2. - Mitral valve: Calcified annulus. Mildly thickened, mildly calcified leaflets . There was mild regurgitation. - Left atrium: The atrium was mildly dilated. - Right atrium: The atrium was mildly dilated. - Pulmonary arteries: Systolic pressure was moderately increased. PA peak pressure: 55 mm  Hg (S).  Patient Profile     81 y.o. male with history of diastolic CHF, mod to sev AS, mod AI, prior stroke, and HTN, who was admitted 3/1 with progressive dyspnea, wt gain, and CHF.  Assessment & Plan    1. Acute on chronic diastolic CHF: -Continues to be volume overloaded -Weight unchanged from admission -I's & O's altered 2/2 bladder irrigation  -Continue IV Lasix gtt at 10 mg/hr -Hold off on metolazone at this time 2/2 renal function, could consider another one-time dose of metolazone later in the week pending renal function -IV albumin  2. Aortic stenosis: -Unlikely to be an intervention candidate -Continue medical therapy  3. Acute on CKD stage III: -Improving -Monitor   Signed, Christell Faith, PA-C [facility] Pager: [phone] 06/12/2016, 8:44 AM

## 2016-06-12 NOTE — Progress Notes (Signed)
Pt complains of pain in lower abdominal pain. PRN peridium and belladonna given. I will continue to assess.

## 2016-06-13 ENCOUNTER — Telehealth: Payer: Self-pay | Admitting: Urology

## 2016-06-13 LAB — BASIC METABOLIC PANEL
Anion gap: 11 (ref 5–15)
BUN: 83 mg/dL — AB (ref 6–20)
CHLORIDE: 91 mmol/L — AB (ref 101–111)
CO2: 38 mmol/L — ABNORMAL HIGH (ref 22–32)
Calcium: 9.4 mg/dL (ref 8.9–10.3)
Creatinine, Ser: 1.76 mg/dL — ABNORMAL HIGH (ref 0.61–1.24)
GFR, EST AFRICAN AMERICAN: 41 mL/min — AB (ref >60)
GFR, EST NON AFRICAN AMERICAN: 35 mL/min — AB (ref >60)
Glucose, Bld: 144 mg/dL — ABNORMAL HIGH (ref 65–99)
POTASSIUM: 4.5 mmol/L (ref 3.5–5.1)
SODIUM: 140 mmol/L (ref 135–145)

## 2016-06-13 LAB — CBC
HCT: 41 % (ref 40.0–52.0)
Hemoglobin: 12.3 g/dL — ABNORMAL LOW (ref 13.0–18.0)
MCH: 19.5 pg — AB (ref 26.0–34.0)
MCHC: 30 g/dL — ABNORMAL LOW (ref 32.0–36.0)
MCV: 64.8 fL — ABNORMAL LOW (ref 80.0–100.0)
PLATELETS: 400 10*3/uL (ref 150–440)
RBC: 6.33 MIL/uL — AB (ref 4.40–5.90)
RDW: 21.9 % — ABNORMAL HIGH (ref 11.5–14.5)
WBC: 33.7 10*3/uL — AB (ref 3.8–10.6)

## 2016-06-13 NOTE — Progress Notes (Signed)
[place] [facility] [place] NAME: Nathan Kramer    MR#:  [id number]  DATE OF BIRTH:  09/23/1935  SUBJECTIVE:   Bladder spasms has improved.  Still having some mild hematuria. Feels a little bit better.  Remains on Lasix gtt but lowered due to BUN rising.    REVIEW OF SYSTEMS:    Review of Systems  Constitutional: Negative for chills and fever.  HENT: Negative for congestion, nosebleeds and tinnitus.   Eyes: Negative for blurred vision and double vision.  Respiratory: Negative for cough, shortness of breath (better) and wheezing.   Cardiovascular: Positive for leg swelling. Negative for chest pain, orthopnea and PND.  Gastrointestinal: Negative for abdominal pain, blood in stool, constipation, diarrhea, melena, nausea and vomiting.  Genitourinary: Positive for hematuria. Negative for dysuria.       Scrotal swelling  Neurological: Negative for dizziness, sensory change and focal weakness.  Psychiatric/Behavioral: Negative for hallucinations, memory loss and suicidal ideas. The patient is not nervous/anxious.   All other systems reviewed and are negative.   Nutrition: heart Healthy Tolerating Diet: Yes Tolerating PT: Home health at discharge.    DRUG ALLERGIES:   Allergies  Allergen Reactions  . Amoxicillin     Other reaction(s): Localized superficial swelling of skin Face swelling   . Reglan [Metoclopramide]     Involuntary movements body    VITALS:  Blood pressure 101/60, pulse 89, temperature 98.3 F (36.8 C), resp. rate 18, height 5\' 8"  (1.727 m), weight 75.5 kg (166 lb 8 oz), SpO2 97 %.  PHYSICAL EXAMINATION:   Physical Exam  GENERAL:  81 y.o.-year-old patient lying in bed in NAD.  EYES: Legally blind. No scleral icterus. Extraocular muscles intact.  HEENT: Head atraumatic, normocephalic. Oropharynx and nasopharynx clear.  +JVD but improving NECK:  Supple, no jugular venous distention. No thyroid enlargement, no tenderness.   LUNGS:Clear to auscultation no wheezing, bibasilar rales, no rhonchi. No use of accessory muscles of respiration.  CARDIOVASCULAR: S1, S2 normal. 3/6 SEM at LSB, No rubs, or gallops.  ABDOMEN: Soft, nontender, ND Bowel sounds present. No organomegaly or mass.  EXTREMITIES: No cyanosis, clubbing, + 2 edema b/l up to scrotum.  Legs wrapped in UNNA boots.   NEUROLOGIC: Cranial nerves II through XII are intact. No focal Motor or sensory deficits b/l. Globally weak. PSYCHIATRIC: The patient is alert and oriented x 3.  SKIN: No obvious rash, lesion, LE wrapped in unna boots.   LABORATORY PANEL:   CBC  Recent Labs Lab 06/13/16 0444  WBC 33.7*  HGB 12.3*  HCT 41.0  PLT 400   ------------------------------------------------------------------------------------------------------------------  Chemistries   Recent Labs Lab 06/13/16 0444  NA 140  K 4.5  CL 91*  CO2 38*  GLUCOSE 144*  BUN 83*  CREATININE 1.76*  CALCIUM 9.4   ------------------------------------------------------------------------------------------------------------------  Cardiac Enzymes No results for input(s): TROPONINI in the last 168 hours. ------------------------------------------------------------------------------------------------------------------  RADIOLOGY:  No results found.   ASSESSMENT AND PLAN:   81 year old male with past medical history of hypertension, pulmonary hypertension, diastolic CHF, moderate aortic stenosis, history of GERD, prostate cancer who presented to the hospital due to shortness of breath and worsening lower extremity edema.  1. Acute on chronic diastolic dysfunction with underlying pulmonary hypertension/anasarca: Still with edema and signs of fluid overload, but much improved since admission.  - Continue Lasix drip with albumin as per nephrology but lasix gtt has been lowered due to rising BUN.  - Appreciate cardiology and nephrology consultation.  Follow I's and O's and  daily weights. About 11 L (-) since admission.   2. Hematuria: Patient now has CBI and hematuria much improved. - Appreciate urology consultation. Hg. Stable.  - cont. CBI for now. Continue belladonna, Pyridium and bladder spasms has improved. - Further management as per UROLOGY.  3. Moderate/Severe  Aortic Stenosis contributing to CHF: As per Cardiology, he is a poor surgical candidate.  - Repeat Echo shows no new changes.   - He will follow up with Cardiology as outpatient.   4. CKD Stage III: Creatinine remains stable. BUN rising and Lasix gtt decreased.  -Continue to monitor BMP - Nephrology consult appreciated.  5. Leukocytosis - unclear etiology of leukocytosis.  Patient is without fever since admission  Urinalysis and chest x-ray were negative for infection. WBC increasing but no clear source and will cont. To monitor. Follow up with Hem/Onc as outpatient.   6. Hypothyroidism: Continue Synthroid.  7. LE edema due to CHF and venous stasis - s/p UNNA boots as per wound team.   - cont. Lasix.    8. Elevated troponin due to demand ischemia and not ACS  CODE STATUS: DNR  TOTAL TIME TAKING CARE OF THIS PATIENT: 30 minutes.   POSSIBLE D/C IN 2-3 DAYS, DEPENDING ON CLINICAL CONDITION.   Henreitta Leber M.D on 06/13/2016 at 1:54 PM  Between 7am to 6pm - Pager - [phone]  After 6pm go to [email or web address] - [employer] [facility]  Office  [phone]  CC: Primary care physician; Tracie Harrier, MD

## 2016-06-13 NOTE — Progress Notes (Signed)
Patient Name: Nathan Kramer Date of Encounter: 06/13/2016  Primary Cardiologist: [facility] Problem List     Principal Problem:   Acute on chronic diastolic CHF (congestive heart failure), NYHA class 3 (HCC) Active Problems:   Aortic stenosis, moderate   Acute kidney injury superimposed on CKD (HCC)   Dietary noncompliance   HTN (hypertension)   Cellulitis   Gross hematuria     Subjective   Reports improvement in SOB. Wants to go home. Lasix gtt decreased to 5 mg/hr by nephrology 2/2 elevated BUN. UOP 5 L over past 24 hours. Weight remains the same at 166 pounds.   Inpatient Medications    Scheduled Meds: . albumin human  12.5 g Intravenous Daily  . cefTRIAXone (ROCEPHIN) IVPB 1 gram/50 mL D5W  1 g Intravenous Q24H  . docusate sodium  200 mg Oral BID  . feeding supplement (ENSURE ENLIVE)  237 mL Oral TID BM  . ferrous gluconate  324 mg Oral BID WC  . levothyroxine  25 mcg Oral QAC breakfast  . morphine  30 mg Oral TID WC  . polyethylene glycol  17 g Oral Daily  . potassium chloride  20 mEq Oral BID  . sodium chloride flush  3 mL Intravenous Q12H  . spironolactone  25 mg Oral Daily  . tamsulosin  0.4 mg Oral QPC supper   Continuous Infusions: . furosemide (LASIX) infusion 5 mg/hr (06/12/16 2256)   PRN Meds: sodium chloride, acetaminophen **OR** acetaminophen, alum & mag hydroxide-simeth, bisacodyl, HYDROcodone-acetaminophen, LORazepam, ondansetron **OR** ondansetron (ZOFRAN) IV, opium-belladonna, phenazopyridine, senna-docusate, sodium chloride, sodium chloride flush   Vital Signs    Vitals:   06/12/16 2008 06/13/16 0523 06/13/16 0806 06/13/16 0808  BP: (!) 102/55 (!) 99/59 92/66   Pulse: 87 89 88   Resp:      Temp: 98.1 F (36.7 C) 98 F (36.7 C)    TempSrc: Oral     SpO2: 96% 97% 92% 96%  Weight:  166 lb 8 oz (75.5 kg)    Height:        Intake/Output Summary (Last 24 hours) at 06/13/16 0907 Last data filed at 06/13/16 0438  Gross per 24  hour  Intake          2028.17 ml  Output             7200 ml  Net         -5171.83 ml   Filed Weights   06/11/16 0340 06/12/16 0511 06/13/16 0523  Weight: 169 lb 4.8 oz (76.8 kg) 166 lb 8 oz (75.5 kg) 166 lb 8 oz (75.5 kg)    Physical Exam    GEN: Well nourished, well developed, in no acute distress.  HEENT: Grossly normal.  Neck: Supple, JVD remains elevated to the angle of the mandible, no carotid bruits, or masses. Cardiac: RRR, no murmurs, rubs, or gallops. No clubbing, cyanosis, LE edema with 1-2+ edema along the bilateral thighs, sacrum, and scrotum.  Radials/DP/PT 2+ and equal bilaterally.  Respiratory:  Bibasilar crackles. GI: Soft, nontender, nondistended, BS + x 4. MS: no deformity or atrophy. Skin: warm and dry, no rash. Neuro:  Strength and sensation are intact. Psych: AAOx3.  Normal affect.  Labs    CBC  Recent Labs  06/12/16 0411 06/13/16 0444  WBC 30.2* 33.7*  HGB 12.1* 12.3*  HCT 40.1 41.0  MCV 63.6* 64.8*  PLT 382 093   Basic Metabolic Panel  Recent Labs  06/12/16 0411 06/13/16 0444  NA 138 140  K 3.8 4.5  CL 91* 91*  CO2 37* 38*  GLUCOSE 115* 144*  BUN 85* 83*  CREATININE 1.74* 1.76*  CALCIUM 9.2 9.4   Liver Function Tests No results for input(s): AST, ALT, ALKPHOS, BILITOT, PROT, ALBUMIN in the last 72 hours. No results for input(s): LIPASE, AMYLASE in the last 72 hours. Cardiac Enzymes No results for input(s): CKTOTAL, CKMB, CKMBINDEX, TROPONINI in the last 72 hours. BNP Invalid input(s): POCBNP D-Dimer No results for input(s): DDIMER in the last 72 hours. Hemoglobin A1C No results for input(s): HGBA1C in the last 72 hours. Fasting Lipid Panel No results for input(s): CHOL, HDL, LDLCALC, TRIG, CHOLHDL, LDLDIRECT in the last 72 hours. Thyroid Function Tests No results for input(s): TSH, T4TOTAL, T3FREE, THYROIDAB in the last 72 hours.  Invalid input(s): FREET3  Telemetry    NSR, 80s bpm - Personally Reviewed  ECG    n/a -  Personally Reviewed  Radiology    No results found.  Cardiac Studies   2D Echocardiogram3.1.2018  Study Conclusions  - Left ventricle: The cavity size was normal. There was mild concentric hypertrophy. Systolic function was normal. The estimated ejection fraction was in the range of 50% to 55%. Wall motion was normal; there were no regional wall motion abnormalities. Features are consistent with a pseudonormal left ventricular filling pattern, with concomitant abnormal relaxation and increased filling pressure (grade 2 diastolic dysfunction). - Ventricular septum: The contour showed systolic flattening. These changes are consistent with RV pressure overload. - Aortic valve: There was moderate to severe stenosis. There was moderate regurgitation. Mean gradient (S): 22 mm Hg. Valve area (VTI): 0.91 cm^2. - Mitral valve: Calcified annulus. Mildly thickened, mildly calcified leaflets . There was mild regurgitation. - Left atrium: The atrium was mildly dilated. - Right atrium: The atrium was mildly dilated. - Pulmonary arteries: Systolic pressure was moderately increased. PA peak pressure: 55 mm Hg (S).  Patient Profile     81 y.o. male with history of diastolic CHF, mod to sev AS, mod AI, prior stroke, and HTN, who was admitted 3/1 with progressive dyspnea, wt gain, and CHF.  Assessment & Plan    1. Acute on chronic diastolic CHF: -Continues to be volume overloaded -Weight unchanged from admission -Documented OUP of 5 L for past 24 hours -Lasix gtt decreased by renal on 3/12 secondary elevated BUN -On Lasix gtt 5 mg/hr, defer to nephrology  -Hold off on metolazone at this time 2/2 renal function -IV albumin  2. Aortic stenosis: -Unlikely to be an intervention candidate -Continue medical therapy  3. Acute on CKD stage III: -Stable -Monitor   4. Dispo: -Consider Palliative care consult to discuss [place]  Signed, Marcille Blanco [place] Pager: [phone] 06/13/2016, 9:07 AM

## 2016-06-13 NOTE — Telephone Encounter (Signed)
Done

## 2016-06-13 NOTE — Progress Notes (Signed)
Nutrition Follow-up  DOCUMENTATION CODES:   Severe malnutrition in context of chronic illness  INTERVENTION:  1. Decrease Ensure Enlive to BID  NUTRITION DIAGNOSIS:   Malnutrition related to chronic illness as evidenced by severe fluid accumulation, severe depletion of muscle mass, severe depletion of body fat. -ongoing  GOAL:   Patient will meet greater than or equal to 90% of their needs -progressing  MONITOR:   PO intake, I & O's, Labs, Weight trends, Supplement acceptance  REASON FOR ASSESSMENT:   Malnutrition Screening Tool    ASSESSMENT:    Nathan Kramer  is a 81 y.o. male with a known history of Congestive heart failure, chronic kidney disease, anemia, prostate cancer, hypertension, CVA presented to the emergency room with swelling in both the lower legs along with redness and weeping lesions in the lower extremities  Patient was sitting on bedside commode during my visit claiming "I'm dying, I need pain medicine." Patient's RN made aware. Per chart, meal completion 100% last 3 meals. Avg meal completion 90% Continues on Lasix gtt Wt stable. Consuming 2/3 ensure per day. Labs and medications reviewed: Colace 200mg  BID, MIralax, KCL, Aldactone, Iron, IV albumin  Diet Order:  Diet Heart Room service appropriate? Yes; Fluid consistency: Thin; Fluid restriction: 1200 mL Fluid  Skin:  Reviewed, no issues  Last BM:  3/4  Height:   Ht Readings from Last 1 Encounters:  06/01/16 5\' 8"  (1.727 m)    Weight:   Wt Readings from Last 1 Encounters:  06/13/16 166 lb 8 oz (75.5 kg)    Ideal Body Weight:  70 kg  BMI:  Body mass index is 25.32 kg/m.  Estimated Nutritional Needs:   Kcal:  1500-1800 calories  Protein:  75-91 gm  Fluid:  >/= 1.5L  EDUCATION NEEDS:   No education needs identified at this time  Satira Anis. Kersti Scavone, MS, RD LDN Inpatient Clinical Dietitian Pager [phone]

## 2016-06-13 NOTE — Plan of Care (Signed)
Problem: Activity: Goal: Capacity to carry out activities will improve Outcome: Progressing Pt is stand by  assist to bathroom.

## 2016-06-13 NOTE — Progress Notes (Signed)
Physical Therapy Treatment Patient Details Name: Nathan Kramer MRN: [id number] DOB: 1935-08-15 Today's Date: 06/13/2016    History of Present Illness Nathan Kramer is a 81 y.o. male with a known history of congestive heart failure, chronic kidney disease, anemia, prostate cancer, hypertension, CVA presented to the emergency room with swelling in both the lower legs along with redness and weeping lesions in the lower extremities. Patient has serous sanguineous fluid weeping from the lower extremity skin.  He also complained of shortness of breath for the last couple of weeks. Has history of orthopnea. He was evaluated in the emergency room his BNP was elevated. He was given IV Lasix for diuresis and IV vancomycin and divided was started for lower extremity cellulitis. Troponin was elevated but patient also had elevation in creatinine. Elevated troponin could be secondary to demand ischemia and renal insufficiency. EKG normal sinus rhythm with no ST segment elevation. Patient was put on oxygen via nasal cannula in the emergency room. Hospitalist service was consulted for further care of the patient. Pt is now admitted for acute on chronic CHF with history of moderate/severe aortic stenosis. He is eager to work with physical therapy on this date.     PT Comments    Pt annoyed during treatment, stated "he wants to go home" but was willing to participate in PT session. Pt able to perform bed mobility and transfers w/ modified independence and required supervision transferring to the toilet. Attempted to ambulate w/ RW and supervision on room air per nursing request and O2 dropped to 85%; was then placed on 2 L/min and O2 returned to 98% within 60 seconds. Pt overall displays some balance and cardiopumonary deficits and he will continue to benefit from skilled PT, Recommend Pt receive HHPT when medically appropriate.    Follow Up Recommendations  Home health PT     Equipment Recommendations   Rolling walker with 5" wheels    Recommendations for Other Services       Precautions / Restrictions Precautions Precautions: Fall Restrictions Weight Bearing Restrictions: No    Mobility  Bed Mobility Overal bed mobility: Modified Independent             General bed mobility comments: pt able to move to sitting w/ increased time and use of bedrails  Transfers Overall transfer level: Needs assistance Equipment used: Rolling walker (2 wheeled) Transfers: Sit to/from Stand Sit to Stand: Supervision         General transfer comment: pt leans forward and requires use of bilat UE to move into standing   Ambulation/Gait Ambulation/Gait assistance: Supervision Ambulation Distance (Feet): 200 Feet Assistive device: Rolling walker (2 wheeled) Gait Pattern/deviations: Decreased stride length;Trunk flexed Gait velocity: Decreased but functional for full household mobility   General Gait Details: pt ambulated around nursing station attempted to ambulate on room air and O2 dropped to 85%, placed on 2 L/min and O2 resotred to 98%, displays symmetrical gait pattern, states he feels much better ambulating    Stairs            Wheelchair Mobility    Modified Rankin (Stroke Patients Only)       Balance Overall balance assessment: Needs assistance Sitting-balance support: No upper extremity supported;Feet supported Sitting balance-Leahy Scale: Good Sitting balance - Comments: able to maintain seated posture w/o back support slightly forwardly flexed    Standing balance support: Bilateral upper extremity supported;During functional activity Standing balance-Leahy Scale: Good Standing balance comment: displays improved stability w/ RW for functional tasks  Cognition Arousal/Alertness: Awake/alert Behavior During Therapy: WFL for tasks assessed/performed Overall Cognitive Status: Within Functional Limits for tasks assessed                       Exercises      General Comments        Pertinent Vitals/Pain Faces Pain Scale: Hurts whole lot Pain Location: pennile and rectal region during bowel movement Pain Descriptors / Indicators: Aching;Burning;Grimacing Pain Intervention(s): Monitored during session;Relaxation;Limited activity within patient's tolerance    Home Living                      Prior Function            PT Goals (current goals can now be found in the care plan section) Acute Rehab PT Goals Patient Stated Goal: Return to prior level of function at home. "I want to live a long time." PT Goal Formulation: With patient Time For Goal Achievement: 06/19/16 Potential to Achieve Goals: Good Progress towards PT goals: Progressing toward goals    Frequency    Min 2X/week      PT Plan Current plan remains appropriate    Co-evaluation             End of Session Equipment Utilized During Treatment: Gait belt;Oxygen Activity Tolerance: Patient tolerated treatment well Patient left: in bed;with call bell/phone within reach Nurse Communication: Mobility status PT Visit Diagnosis: Unsteadiness on feet (R26.81);Muscle weakness (generalized) (M62.81)     Time: 7681-1572 PT Time Calculation (min) (ACUTE ONLY): 34 min  Charges:                       G Codes:       [employer] Student PT 06/13/2016, 10:43 AM

## 2016-06-13 NOTE — Consult Note (Signed)
[place] Nurse wound follow up To replace Unnas boots today and patient refuses.  He states he has too many things restricting him.  Explained that his wounds have healed and his skin looks good because of the compression. He still refuses.  Wraps are removed and skin is intact.  Left medial lower leg has dark, raised scabbed lesion that is dry and intact.   Wife is not at bedside.  Legs are cleansed with soap and water.  All blistering has resolved.  Will revisit when his wife is at bedside and offer to wrap his legs again.   [place] team will follow.  Domenic Moras RN BSN [place] Pager [phone]

## 2016-06-13 NOTE — Progress Notes (Signed)
[facility]  ROUNDING NOTE   Subjective:  Patient frustrated that he remains in the hospital at this point in time. Lasix drip was decreased over the preceding 24 hours. Patient was on continuous bladder irrigation however this has been stopped for the moment.   Objective:  Vital signs in last 24 hours:  Temp:  [98 F (36.7 C)-98.3 F (36.8 C)] 98.3 F (36.8 C) (03/13 1150) Pulse Rate:  [87-89] 89 (03/13 1150) Resp:  [18] 18 (03/13 1150) BP: (92-102)/(55-66) 101/60 (03/13 1150) SpO2:  [92 %-97 %] 97 % (03/13 1150) Weight:  [75.5 kg (166 lb 8 oz)] 75.5 kg (166 lb 8 oz) (03/13 0523)  Weight change: 0 kg (0 lb) Filed Weights   06/11/16 0340 06/12/16 0511 06/13/16 0523  Weight: 76.8 kg (169 lb 4.8 oz) 75.5 kg (166 lb 8 oz) 75.5 kg (166 lb 8 oz)    Intake/Output: I/O last 3 completed shifts: In: 4198.2 [P.O.:480; I.V.:168.2; Other:3500; IV Piggyback:50] Out: 62947 [Urine:11150]   Intake/Output this shift:  Total I/O In: 360 [P.O.:360] Out: 1700 [Urine:1700]  Physical Exam: General: NAD  Head: Moist oral mucosal membranes, reddish tongue  Eyes: Anicteric  Neck: Supple, trachea midline, +JVD  Lungs:  Scattered rhonchi, normal effort   Heart: +3/6 crescendo murmur, irregular rhythm  Abdomen:  Soft, nontender, Distended, abdominal wall edema ++  Extremities: UNNA Boots bilaterally, 2+ peripheral edema up to lower abdomen  Neurologic: Nonfocal, moving all four extremities  GU: Foley in place        Basic Metabolic Panel:  Recent Labs Lab 06/09/16 0414 06/10/16 0535 06/11/16 0432 06/12/16 0411 06/13/16 0444  NA 138 139 136 138 140  K 4.3 4.2 4.2 3.8 4.5  CL 93* 90* 90* 91* 91*  CO2 34* 37* 34* 37* 38*  GLUCOSE 105* 120* 121* 115* 144*  BUN 79* 81* 81* 85* 83*  CREATININE 1.88* 1.69* 1.76* 1.74* 1.76*  CALCIUM 9.6 9.5 9.0 9.2 9.4    Liver Function Tests: No results for input(s): AST, ALT, ALKPHOS, BILITOT, PROT, ALBUMIN in the last 168  hours. No results for input(s): LIPASE, AMYLASE in the last 168 hours. No results for input(s): AMMONIA in the last 168 hours.  CBC:  Recent Labs Lab 06/09/16 0414 06/10/16 0535 06/11/16 0432 06/12/16 0411 06/13/16 0444  WBC 26.5* 26.9* 32.7* 30.2* 33.7*  HGB 12.9* 12.7* 12.5* 12.1* 12.3*  HCT 43.9 43.0 40.9 40.1 41.0  MCV 64.4* 64.3* 64.5* 63.6* 64.8*  PLT 384 382 360 382 400    Cardiac Enzymes: No results for input(s): CKTOTAL, CKMB, CKMBINDEX, TROPONINI in the last 168 hours.  BNP: Invalid input(s): POCBNP  CBG:  Recent Labs Lab 06/07/16 0748 06/10/16 0804  GLUCAP 104* 116*    Microbiology: Results for orders placed or performed during the hospital encounter of 05/31/16  Blood culture (routine x 2)     Status: None   Collection Time: 06/01/16 12:20 AM  Result Value Ref Range Status   Specimen Description BLOOD  L WRIST  Final   Special Requests BOTTLES DRAWN AEROBIC AND ANAEROBIC  ADEQUATE   Final   Culture NO GROWTH 5 DAYS  Final   Report Status 06/06/2016 FINAL  Final  Blood culture (routine x 2)     Status: None   Collection Time: 06/01/16  1:11 AM  Result Value Ref Range Status   Specimen Description BLOOD  R FOREARM  Final   Special Requests BOTTLES DRAWN AEROBIC AND ANAEROBIC  ADEQUATE  Final  Culture NO GROWTH 5 DAYS  Final   Report Status 06/06/2016 FINAL  Final    Coagulation Studies: No results for input(s): LABPROT, INR in the last 72 hours.  Urinalysis: No results for input(s): COLORURINE, LABSPEC, PHURINE, GLUCOSEU, HGBUR, BILIRUBINUR, KETONESUR, PROTEINUR, UROBILINOGEN, NITRITE, LEUKOCYTESUR in the last 72 hours.  Invalid input(s): APPERANCEUR    Imaging: No results found.   Medications:   . furosemide (LASIX) infusion 5 mg/hr (06/12/16 2256)   . albumin human  12.5 g Intravenous Daily  . cefTRIAXone (ROCEPHIN) IVPB 1 gram/50 mL D5W  1 g Intravenous Q24H  . docusate sodium  200 mg Oral BID  . feeding supplement (ENSURE ENLIVE)   237 mL Oral TID BM  . ferrous gluconate  324 mg Oral BID WC  . levothyroxine  25 mcg Oral QAC breakfast  . morphine  30 mg Oral TID WC  . polyethylene glycol  17 g Oral Daily  . potassium chloride  20 mEq Oral BID  . sodium chloride flush  3 mL Intravenous Q12H  . spironolactone  25 mg Oral Daily  . tamsulosin  0.4 mg Oral QPC supper   sodium chloride, acetaminophen **OR** acetaminophen, alum & mag hydroxide-simeth, bisacodyl, HYDROcodone-acetaminophen, LORazepam, ondansetron **OR** ondansetron (ZOFRAN) IV, opium-belladonna, phenazopyridine, senna-docusate, sodium chloride, sodium chloride flush  Assessment/ Plan:  Nathan Kramer is a 81 y.o. white male with diastolic congestive heart failure, hypertension, CVA, BPH, , who was admitted to [facility] on 05/31/2016   1. Acute renal failure on chronic kidney disease stage III: with proteinuria, anasarca.  Chronic kidney disease secondary to hypertension. Acute renal failure from acute cardiorenal syndrome from diastolic congestive heart failure.  - BUN down slightly to 83 with a creatinine of 1.76.  Patient still has considerable anasarca.  Therefore we will continue Lasix drip for now.   2.  Anasarca/generalized edema; Cardiac - aortic stenosis. Gr 2 diastolic dysfunction; EF 46-80%, Pulm HTN and RV pressure overload - Continue supportive care with Unna boots, Lasix drip, IV albumin.  3.  Gross hematuria:  Improved, now off CBI.        LOS: 12 Nathan Kramer 3/13/20183:02 PM

## 2016-06-13 NOTE — Telephone Encounter (Signed)
error 

## 2016-06-14 DIAGNOSIS — R601 Generalized edema: Secondary | ICD-10-CM

## 2016-06-14 LAB — BASIC METABOLIC PANEL
Anion gap: 10 (ref 5–15)
BUN: 88 mg/dL — AB (ref 6–20)
CALCIUM: 9.2 mg/dL (ref 8.9–10.3)
CO2: 35 mmol/L — ABNORMAL HIGH (ref 22–32)
CREATININE: 1.95 mg/dL — AB (ref 0.61–1.24)
Chloride: 92 mmol/L — ABNORMAL LOW (ref 101–111)
GFR calc Af Amer: 36 mL/min — ABNORMAL LOW (ref >60)
GFR calc non Af Amer: 31 mL/min — ABNORMAL LOW (ref >60)
Glucose, Bld: 145 mg/dL — ABNORMAL HIGH (ref 65–99)
Potassium: 5.1 mmol/L (ref 3.5–5.1)
SODIUM: 137 mmol/L (ref 135–145)

## 2016-06-14 MED ORDER — TORSEMIDE 20 MG PO TABS
40.0000 mg | ORAL_TABLET | Freq: Two times a day (BID) | ORAL | Status: DC
  Administered 2016-06-14 – 2016-06-15 (×2): 40 mg via ORAL
  Filled 2016-06-14 (×2): qty 2

## 2016-06-14 NOTE — Progress Notes (Signed)
Patient Name: Nathan Kramer Date of Encounter: 06/14/2016  Primary Cardiologist: [facility] Problem List     Principal Problem:   Acute on chronic diastolic CHF (congestive heart failure), NYHA class 3 (HCC) Active Problems:   Aortic stenosis, moderate   Acute kidney injury superimposed on CKD (HCC)   Dietary noncompliance   HTN (hypertension)   Cellulitis   Gross hematuria     Subjective   Feels about the same. Continues to note pain with Foley. UOP for past 24 hours of 2.4 L. Weight up 4 pounds today to 170 from 166 (admission weight of 166 pounds). Labs pending at this time. Remains on Lasix gtt at 5 mg/hr.   Inpatient Medications    Scheduled Meds: . albumin human  12.5 g Intravenous Daily  . docusate sodium  200 mg Oral BID  . feeding supplement (ENSURE ENLIVE)  237 mL Oral TID BM  . ferrous gluconate  324 mg Oral BID WC  . levothyroxine  25 mcg Oral QAC breakfast  . morphine  30 mg Oral TID WC  . polyethylene glycol  17 g Oral Daily  . potassium chloride  20 mEq Oral BID  . sodium chloride flush  3 mL Intravenous Q12H  . spironolactone  25 mg Oral Daily  . tamsulosin  0.4 mg Oral QPC supper   Continuous Infusions: . furosemide (LASIX) infusion 5 mg/hr (06/12/16 2256)   PRN Meds: sodium chloride, acetaminophen **OR** acetaminophen, alum & mag hydroxide-simeth, bisacodyl, HYDROcodone-acetaminophen, LORazepam, ondansetron **OR** ondansetron (ZOFRAN) IV, opium-belladonna, phenazopyridine, senna-docusate, sodium chloride, sodium chloride flush   Vital Signs    Vitals:   06/13/16 2001 06/14/16 0152 06/14/16 0356 06/14/16 0537  BP: (!) 103/44  (!) 95/48 (!) 107/56  Pulse: 86  89 84  Resp: 16  18   Temp: 97.9 F (36.6 C)  98.4 F (36.9 C)   TempSrc: Oral  Oral   SpO2: 98% 93% 99%   Weight:   170 lb 3.2 oz (77.2 kg)   Height:        Intake/Output Summary (Last 24 hours) at 06/14/16 0927 Last data filed at 06/14/16 0645  Gross per 24 hour    Intake           406.25 ml  Output             2400 ml  Net         -1993.75 ml   Filed Weights   06/12/16 0511 06/13/16 0523 06/14/16 0356  Weight: 166 lb 8 oz (75.5 kg) 166 lb 8 oz (75.5 kg) 170 lb 3.2 oz (77.2 kg)    Physical Exam    GEN: Well nourished, well developed, in no acute distress.  HEENT: Grossly normal.  Neck: Supple, JVD elevated to the angle of the mandible, no carotid bruits, or masses. Cardiac: RRR, no murmurs, rubs, or gallops. No clubbing, cyanosis, 1-2+ pitting edema to the sacrum and scrotum.  Radials/DP/PT 2+ and equal bilaterally.  Respiratory:  Bibasilar crackles. GI: Soft, nontender, distended with anasarca, BS + x 4. MS: no deformity or atrophy. Skin: warm and dry, no rash. Neuro:  Strength and sensation are intact. Psych: AAOx3.  Normal affect.  Labs    CBC  Recent Labs  06/12/16 0411 06/13/16 0444  WBC 30.2* 33.7*  HGB 12.1* 12.3*  HCT 40.1 41.0  MCV 63.6* 64.8*  PLT 382 277   Basic Metabolic Panel  Recent Labs  06/12/16 0411 06/13/16 0444  NA 138  140  K 3.8 4.5  CL 91* 91*  CO2 37* 38*  GLUCOSE 115* 144*  BUN 85* 83*  CREATININE 1.74* 1.76*  CALCIUM 9.2 9.4   Liver Function Tests No results for input(s): AST, ALT, ALKPHOS, BILITOT, PROT, ALBUMIN in the last 72 hours. No results for input(s): LIPASE, AMYLASE in the last 72 hours. Cardiac Enzymes No results for input(s): CKTOTAL, CKMB, CKMBINDEX, TROPONINI in the last 72 hours. BNP Invalid input(s): POCBNP D-Dimer No results for input(s): DDIMER in the last 72 hours. Hemoglobin A1C No results for input(s): HGBA1C in the last 72 hours. Fasting Lipid Panel No results for input(s): CHOL, HDL, LDLCALC, TRIG, CHOLHDL, LDLDIRECT in the last 72 hours. Thyroid Function Tests No results for input(s): TSH, T4TOTAL, T3FREE, THYROIDAB in the last 72 hours.  Invalid input(s): FREET3  Telemetry    SR, 70s bpm, short run of atrial tach 120 bpm - Personally Reviewed  ECG    n/a  - Personally Reviewed  Radiology    No results found.  Cardiac Studies   2D Echocardiogram3.1.2018  Study Conclusions  - Left ventricle: The cavity size was normal. There was mild concentric hypertrophy. Systolic function was normal. The estimated ejection fraction was in the range of 50% to 55%. Wall motion was normal; there were no regional wall motion abnormalities. Features are consistent with a pseudonormal left ventricular filling pattern, with concomitant abnormal relaxation and increased filling pressure (grade 2 diastolic dysfunction). - Ventricular septum: The contour showed systolic flattening. These changes are consistent with RV pressure overload. - Aortic valve: There was moderate to severe stenosis. There was moderate regurgitation. Mean gradient (S): 22 mm Hg. Valve area (VTI): 0.91 cm^2. - Mitral valve: Calcified annulus. Mildly thickened, mildly calcified leaflets . There was mild regurgitation. - Left atrium: The atrium was mildly dilated. - Right atrium: The atrium was mildly dilated. - Pulmonary arteries: Systolic pressure was moderately increased. PA peak pressure: 55 mm Hg (S).  Patient Profile     81 y.o. male with history of diastolic CHF, mod to sev AS, mod AI, prior stroke, and HTN, who was admitted 3/1 with progressive dyspnea, wt gain, and CHF.  Assessment & Plan    1. Acute on chronic diastolic CHF: -Continues to be volume overloaded -Weight unchanged from admission -Documented OUP of 2.4 L for past 24 hours and 11.4 L for the admission -Weight up 4 pounds today (170 pounds with an admission weight of 166 pounds) -Remains in Lasix gtt at 5 mg/hr -Await bmet this morning -Consider IV pushes of Lasix if renal function is improving  -Hold off on metolazone at this time 2/2 renal function -IV albumin  2. Aortic stenosis: -Unlikely to be an intervention candidate -Continue medical therapy  3. Acute on CKD stage  III: -Stable -Monitor   4. Dispo: -Consider Palliative care consult to discuss [place]  Signed, Marcille Blanco [place] Pager: [phone] 06/14/2016, 9:27 AM    Attending Note Patient seen and examined, agree with detailed note above,  Patient presentation and plan discussed on rounds.   Patient complaining about sleep disorder, would like a sleeping pill He has indicated he would like to go home  Review of lab work shows steady climb in renal function, Creatinine 1.7 up to 1.9 in the past 2 days  On physical exam still with woody edema thighs, flanks, sacrum Overall dramatically improved in the past 2 weeks, weight down significantly But still pitting edema Lungs otherwise clear, and no significant JVD  Leg wraps removed, woody edema in the thighs 1+  --- Lab work as above, climbing creatinine  -----> acute on chronic diastolic CHF Consider discontinuing Lasix infusion, change to torsemide 40 mg twice a day Given climb in BUN and creatinine, will need to be less aggressive Low blood pressure limiting addition of other medications Would hold Aldactone given climb in potassium   Greater than 50% was spent in counseling and coordination of care with patient Total encounter time 25 minutes or more   Signed: Esmond Plants  M.D., Ph.D. [facility]

## 2016-06-14 NOTE — Progress Notes (Signed)
[facility]  ROUNDING NOTE   Subjective:  Pt still with considerable volume overload.  UOP was 2.4 liters over the preceding 24 hours.  BUN currently 88 with a Cr of 1.95.   Objective:  Vital signs in last 24 hours:  Temp:  [97.9 F (36.6 C)-98.4 F (36.9 C)] 98.2 F (36.8 C) (03/14 1134) Pulse Rate:  [84-89] 86 (03/14 1134) Resp:  [16-18] 18 (03/14 1134) BP: (95-107)/(44-56) 107/55 (03/14 1134) SpO2:  [93 %-100 %] 100 % (03/14 1134) Weight:  [77.2 kg (170 lb 3.2 oz)] 77.2 kg (170 lb 3.2 oz) (03/14 0356)  Weight change: 1.678 kg (3 lb 11.2 oz) Filed Weights   06/12/16 0511 06/13/16 0523 06/14/16 0356  Weight: 75.5 kg (166 lb 8 oz) 75.5 kg (166 lb 8 oz) 77.2 kg (170 lb 3.2 oz)    Intake/Output: I/O last 3 completed shifts: In: 1954.4 [P.O.:360; I.V.:94.4; Other:1500] Out: 5700 [Urine:5700]   Intake/Output this shift:  Total I/O In: 240 [P.O.:240] Out: -   Physical Exam: General: NAD  Head: Moist oral mucosal membranes, reddish tongue  Eyes: Anicteric  Neck: Supple, trachea midline, +JVD  Lungs:  Scattered rhonchi, normal effort   Heart: +3/6 crescendo murmur, irregular rhythm  Abdomen:  Soft, nontender, Distended, abdominal wall edema ++  Extremities: UNNA Boots bilaterally, 2+ peripheral edema up to lower abdomen  Neurologic: Nonfocal, moving all four extremities  GU: Foley in place        Basic Metabolic Panel:  Recent Labs Lab 06/10/16 0535 06/11/16 0432 06/12/16 0411 06/13/16 0444 06/14/16 0923  NA 139 136 138 140 137  K 4.2 4.2 3.8 4.5 5.1  CL 90* 90* 91* 91* 92*  CO2 37* 34* 37* 38* 35*  GLUCOSE 120* 121* 115* 144* 145*  BUN 81* 81* 85* 83* 88*  CREATININE 1.69* 1.76* 1.74* 1.76* 1.95*  CALCIUM 9.5 9.0 9.2 9.4 9.2    Liver Function Tests: No results for input(s): AST, ALT, ALKPHOS, BILITOT, PROT, ALBUMIN in the last 168 hours. No results for input(s): LIPASE, AMYLASE in the last 168 hours. No results for input(s): AMMONIA in  the last 168 hours.  CBC:  Recent Labs Lab 06/09/16 0414 06/10/16 0535 06/11/16 0432 06/12/16 0411 06/13/16 0444  WBC 26.5* 26.9* 32.7* 30.2* 33.7*  HGB 12.9* 12.7* 12.5* 12.1* 12.3*  HCT 43.9 43.0 40.9 40.1 41.0  MCV 64.4* 64.3* 64.5* 63.6* 64.8*  PLT 384 382 360 382 400    Cardiac Enzymes: No results for input(s): CKTOTAL, CKMB, CKMBINDEX, TROPONINI in the last 168 hours.  BNP: Invalid input(s): POCBNP  CBG:  Recent Labs Lab 06/10/16 0804  GLUCAP 116*    Microbiology: Results for orders placed or performed during the hospital encounter of 05/31/16  Blood culture (routine x 2)     Status: None   Collection Time: 06/01/16 12:20 AM  Result Value Ref Range Status   Specimen Description BLOOD  L WRIST  Final   Special Requests BOTTLES DRAWN AEROBIC AND ANAEROBIC  ADEQUATE   Final   Culture NO GROWTH 5 DAYS  Final   Report Status 06/06/2016 FINAL  Final  Blood culture (routine x 2)     Status: None   Collection Time: 06/01/16  1:11 AM  Result Value Ref Range Status   Specimen Description BLOOD  R FOREARM  Final   Special Requests BOTTLES DRAWN AEROBIC AND ANAEROBIC  ADEQUATE  Final   Culture NO GROWTH 5 DAYS  Final   Report Status 06/06/2016 FINAL  Final    Coagulation Studies: No results for input(s): LABPROT, INR in the last 72 hours.  Urinalysis: No results for input(s): COLORURINE, LABSPEC, PHURINE, GLUCOSEU, HGBUR, BILIRUBINUR, KETONESUR, PROTEINUR, UROBILINOGEN, NITRITE, LEUKOCYTESUR in the last 72 hours.  Invalid input(s): APPERANCEUR    Imaging: No results found.   Medications:    . albumin human  12.5 g Intravenous Daily  . docusate sodium  200 mg Oral BID  . feeding supplement (ENSURE ENLIVE)  237 mL Oral TID BM  . ferrous gluconate  324 mg Oral BID WC  . levothyroxine  25 mcg Oral QAC breakfast  . morphine  30 mg Oral TID WC  . polyethylene glycol  17 g Oral Daily  . sodium chloride flush  3 mL Intravenous Q12H  . spironolactone  25 mg  Oral Daily  . tamsulosin  0.4 mg Oral QPC supper  . torsemide  40 mg Oral BID   sodium chloride, acetaminophen **OR** acetaminophen, alum & mag hydroxide-simeth, bisacodyl, HYDROcodone-acetaminophen, LORazepam, ondansetron **OR** ondansetron (ZOFRAN) IV, opium-belladonna, phenazopyridine, senna-docusate, sodium chloride, sodium chloride flush  Assessment/ Plan:  Mr. KORTLAND NICHOLS is a 81 y.o. white male with diastolic congestive heart failure, hypertension, CVA, BPH, , who was admitted to [facility] on 05/31/2016   1. Acute renal failure on chronic kidney disease stage III: with proteinuria, anasarca.  Chronic kidney disease secondary to hypertension. Acute renal failure from acute cardiorenal syndrome from diastolic congestive heart failure.  - bUN and creatinine remain quite high.  This is likely in response to diuretics that have been necessary to treat his volume overload.  Patient taken off of Lasix drip.  He has been converted to torsemide.  Continue to monitor renal function closely.  Discontinue albumin.   2.  Anasarca/generalized edema; Cardiac - aortic stenosis. Gr 2 diastolic dysfunction; EF 03-88%, Pulm HTN and RV pressure overload -patient taken off of Lasix drip and transitioned over to torsemide.  We will discontinue albumin as well.  3.  Gross hematuria:  Improved, now off CBI.        LOS: 13 Delorice Bannister 3/14/20184:58 PM

## 2016-06-14 NOTE — Progress Notes (Signed)
SATURATION QUALIFICATIONS: (This note is used to comply with regulatory documentation for home oxygen)  Patient Saturations on Room Air at Rest = 93%  Patient Saturations on Room Air while Ambulating 85%  Patient Saturations on 2 Liters of oxygen while Ambulating 98%

## 2016-06-14 NOTE — Progress Notes (Signed)
[facility] [facility] [place] NAME: Nathan Kramer    MR#:  [id number]  DATE OF BIRTH:  1935-07-07  SUBJECTIVE:   Bladder spasms have improved.  Hematuria has resolved.  Creatinine trending upwards. Overall peripheral edema has improved clinically. Patient wants to go home.   REVIEW OF SYSTEMS:    Review of Systems  Constitutional: Negative for chills and fever.  HENT: Negative for congestion, nosebleeds and tinnitus.   Eyes: Negative for blurred vision and double vision.  Respiratory: Negative for cough, shortness of breath (better) and wheezing.   Cardiovascular: Positive for leg swelling. Negative for chest pain, orthopnea and PND.  Gastrointestinal: Negative for abdominal pain, blood in stool, constipation, diarrhea, melena, nausea and vomiting.  Genitourinary: Negative for dysuria and hematuria.       Scrotal swelling  Neurological: Negative for dizziness, sensory change and focal weakness.  Psychiatric/Behavioral: Negative for hallucinations, memory loss and suicidal ideas. The patient is not nervous/anxious.   All other systems reviewed and are negative.   Nutrition: heart Healthy Tolerating Diet: Yes Tolerating PT: Home health at discharge.    DRUG ALLERGIES:   Allergies  Allergen Reactions  . Amoxicillin     Other reaction(s): Localized superficial swelling of skin Face swelling   . Reglan [Metoclopramide]     Involuntary movements body    VITALS:  Blood pressure (!) 107/55, pulse 86, temperature 98.2 F (36.8 C), resp. rate 18, height 5\' 8"  (1.727 m), weight 77.2 kg (170 lb 3.2 oz), SpO2 100 %.  PHYSICAL EXAMINATION:   Physical Exam  GENERAL:  81 y.o.-year-old patient lying in bed in NAD.  EYES: Legally blind. No scleral icterus. Extraocular muscles intact.  HEENT: Head atraumatic, normocephalic. Oropharynx and nasopharynx clear.  +JVD but improving NECK:  Supple, no jugular venous distention. No thyroid enlargement, no  tenderness.  LUNGS:Clear to auscultation no wheezing, bibasilar rales, no rhonchi. No use of accessory muscles of respiration.  CARDIOVASCULAR: S1, S2 normal. 3/6 SEM at LSB, No rubs, or gallops.  ABDOMEN: Soft, nontender, ND Bowel sounds present. No organomegaly or mass.  EXTREMITIES: No cyanosis, clubbing, + 2 edema b/l up to scrotum.  Legs wrapped in UNNA boots.   NEUROLOGIC: Cranial nerves II through XII are intact. No focal Motor or sensory deficits b/l. Globally weak. PSYCHIATRIC: The patient is alert and oriented x 3.  SKIN: No obvious rash, lesion, LE wrapped in unna boots.   LABORATORY PANEL:   CBC  Recent Labs Lab 06/13/16 0444  WBC 33.7*  HGB 12.3*  HCT 41.0  PLT 400   ------------------------------------------------------------------------------------------------------------------  Chemistries   Recent Labs Lab 06/14/16 0923  NA 137  K 5.1  CL 92*  CO2 35*  GLUCOSE 145*  BUN 88*  CREATININE 1.95*  CALCIUM 9.2   ------------------------------------------------------------------------------------------------------------------  Cardiac Enzymes No results for input(s): TROPONINI in the last 168 hours. ------------------------------------------------------------------------------------------------------------------  RADIOLOGY:  No results found.   ASSESSMENT AND PLAN:   81 year old male with past medical history of hypertension, pulmonary hypertension, diastolic CHF, moderate aortic stenosis, history of GERD, prostate cancer who presented to the hospital due to shortness of breath and worsening lower extremity edema.  1. Acute on chronic diastolic dysfunction with underlying pulmonary hypertension/anasarca: Still with edema and signs of fluid overload, but much improved since admission.  -Creatinine is trending upwards now. We'll discontinue Lasix drip, start oral torsemide as per cardiology. Continue albumin infusions. - Appreciate cardiology and  nephrology consultation.  Follow I's and O's and  daily weights. About 11.5 L (-) since admission.   2. Hematuria: s/p CBI and hematuria has resolved now.   - Appreciate urology consultation. Hg. Stable.  - pt. Will need to go home with foley and they will do voiding trial as outpatient. Continue belladonna, Pyridium and bladder spasms have improved. - Further management as per UROLOGY.  3. Moderate/Severe  Aortic Stenosis contributing to CHF: As per Cardiology, he is a poor surgical candidate.  - Repeat Echo shows no new changes.   - He will follow up with Cardiology as outpatient.   4. CKD Stage III: Creatinine slightly rising today.  Stopped Lasix gtt and switched to Oral Torsemide as per Cards.  -Continue to monitor BMP - Nephrology consult appreciated.  5. Leukocytosis - unclear etiology of leukocytosis.  Patient is without fever since admission  Urinalysis and chest x-ray were negative for infection. WBC increasing but no clear source and will cont. To monitor. Follow up with Hem/Onc as outpatient.   6. Hypothyroidism: Continue Synthroid.  7. LE edema due to CHF and venous stasis - s/p UNNA boots as per wound team.   - cont. Lasix.    8. Elevated troponin due to demand ischemia and not ACS  D/c home tomorrow with Home health services.   CODE STATUS: DNR  TOTAL TIME TAKING CARE OF THIS PATIENT: 30 minutes.   POSSIBLE D/C IN 1-2 DAYS, DEPENDING ON CLINICAL CONDITION.   Henreitta Leber M.D on 06/14/2016 at 4:04 PM  Between 7am to 6pm - Pager - [phone]  After 6pm go to [email or web address] - [employer] [facility]  Office  [phone]  CC: Primary care physician; Tracie Harrier, MD

## 2016-06-14 NOTE — Care Management Important Message (Signed)
Important Message  Patient Details  Name: ANTUANE EASTRIDGE MRN: [id number] Date of Birth: [birth year]   Medicare Important Message Given:  Yes    Katrina Stack, RN 06/14/2016, 12:06 PM

## 2016-06-14 NOTE — Care Management (Signed)
Spoke with attending and anticipate discharge home within next 24 hours with home health nursing, physical therapy and new home 02.  No agency preference.  Referral to [facility].

## 2016-06-14 NOTE — Progress Notes (Signed)
SATURATION QUALIFICATIONS: (This note is used to comply with regulatory documentation for home oxygen)  Patient Saturations on Room Air at Rest = 92%  Patient Saturations on Room Air while Ambulating = 85%  Patient Saturations on 2 Liters of oxygen while Ambulating = 98%  Please briefly explain why patient needs home oxygen:

## 2016-06-15 ENCOUNTER — Telehealth: Payer: Self-pay | Admitting: *Deleted

## 2016-06-15 LAB — BASIC METABOLIC PANEL
ANION GAP: 11 (ref 5–15)
BUN: 87 mg/dL — ABNORMAL HIGH (ref 6–20)
CALCIUM: 9.4 mg/dL (ref 8.9–10.3)
CO2: 35 mmol/L — AB (ref 22–32)
Chloride: 92 mmol/L — ABNORMAL LOW (ref 101–111)
Creatinine, Ser: 1.97 mg/dL — ABNORMAL HIGH (ref 0.61–1.24)
GFR, EST AFRICAN AMERICAN: 36 mL/min — AB (ref >60)
GFR, EST NON AFRICAN AMERICAN: 31 mL/min — AB (ref >60)
Glucose, Bld: 119 mg/dL — ABNORMAL HIGH (ref 65–99)
Potassium: 4.7 mmol/L (ref 3.5–5.1)
Sodium: 138 mmol/L (ref 135–145)

## 2016-06-15 MED ORDER — TORSEMIDE 20 MG PO TABS: 40.0000 mg | ORAL_TABLET | Freq: Two times a day (BID) | ORAL | Status: DC

## 2016-06-15 MED ORDER — LORAZEPAM 0.5 MG PO TABS: 0.5000 mg | ORAL_TABLET | Freq: Two times a day (BID) | ORAL | 0 refills | Status: DC | PRN

## 2016-06-15 MED ORDER — MORPHINE SULFATE ER 30 MG PO TBCR: 30.0000 mg | EXTENDED_RELEASE_TABLET | Freq: Three times a day (TID) | ORAL | 0 refills | Status: DC

## 2016-06-15 NOTE — Progress Notes (Signed)
New referral for home Palliative services following discharge received from [facility]. Referral made aware. Planned discharge home today. Thank you. Flo Shanks RN, BSN, [facility] [place], [facility] [phone] c

## 2016-06-15 NOTE — Discharge Instructions (Addendum)
Heart Failure Clinic appointment on June 19, 2016 at 9:40am with Darylene Price, [place]. Please call [phone] to reschedule.    - Daily fluids < 2 liters. - Low salt diet - Check weight everyday and keep log. Take to your doctors appt. - Take extra dose of Torsemide if you gain more than 3 pounds weight.  Unnas boots application weekly

## 2016-06-15 NOTE — Telephone Encounter (Signed)
Spoke with patient and coordinated appointments. Scheduled him to come in next week with Dr. Yvone Neu and canceled upcoming echocardiogram since he has had this done as inpatient.   Patient contacted regarding discharge from [facility] on 06/15/16.  Patient understands to follow up with provider Dr. Yvone Neu on 06/22/16 at 11:45AM at [facility]. Patient understands discharge instructions? Not yet received Patient understands medications and regiment? Not yet received Patient understands to bring all medications to this visit? Yes  Spoke with patients wife and reviewed appointment information with her. Instructed her to call back if she has any questions regarding discharge information or medications. She verbalized understanding with no further questions at this time.

## 2016-06-15 NOTE — Progress Notes (Signed)
Patient: Nathan Kramer / Admit Date: 05/31/2016 / Date of Encounter: 06/15/2016, 1:01 PM   Hospital Problem List     Principal Problem:   Acute on chronic diastolic CHF (congestive heart failure), NYHA class 3 ([place]) Active Problems:   Aortic stenosis, moderate   HTN (hypertension)   Acute kidney injury superimposed on CKD (HCC)   Cellulitis   Dietary noncompliance   Gross hematuria     Subjective   Very sleepy this morning, Had 2 Percocet pills at midnight, 2 more Percocet pills early this morning, Ativan 0.5 mg IV, also morphine 30 mg. Difficult to arouse Wife in the room, long discussion with her concerning his progress, weakness She feels that she can take care of him at home but does not have much assistance Daughter is a Marine scientist but is working all the time Patient's wife takes care of her granddaughter on a frequent basis  Inpatient Medications    Scheduled Meds: . docusate sodium  200 mg Oral BID  . feeding supplement (ENSURE ENLIVE)  237 mL Oral TID BM  . ferrous gluconate  324 mg Oral BID WC  . levothyroxine  25 mcg Oral QAC breakfast  . morphine  30 mg Oral TID WC  . polyethylene glycol  17 g Oral Daily  . sodium chloride flush  3 mL Intravenous Q12H  . tamsulosin  0.4 mg Oral QPC supper  . [START ON 06/19/2016] torsemide  40 mg Oral BID   Continuous Infusions:  PRN Meds: sodium chloride, acetaminophen **OR** acetaminophen, alum & mag hydroxide-simeth, bisacodyl, HYDROcodone-acetaminophen, LORazepam, ondansetron **OR** ondansetron (ZOFRAN) IV, opium-belladonna, phenazopyridine, senna-docusate, sodium chloride, sodium chloride flush   Objective: Telemetry: Normal sinus rhythm Physical Exam: Blood pressure (!) 100/55, pulse 80, temperature 97.5 F (36.4 C), temperature source Oral, resp. rate 19, height 5\' 8"  (1.727 m), weight 173 lb 3.2 oz (78.6 kg), SpO2 98 %. Body mass index is 26.33 kg/m. GEN: Thin, in no acute distress. Lethargic HEENT: Grossly  normal.  Neck: Supple, +JVD, carotid bruits, or masses. Cardiac: RRR, no murmurs, rubs, or gallops. No clubbing, cyanosis, He has woody 1+ pitting edema posterior thighs, buttocks, sacral area   Radials/DP/PT 2+ and equal bilaterally.  Respiratory:  Respirations regular and unlabored, clear to auscultation bilaterally. GI: Soft, nontender, nondistended, BS + x 4. MS: no deformity or atrophy. Skin: warm and dry, no rash. Neuro: Neuro not tested this morning as he is lethargic Psych: Arousable with stimulation,  lethargic/sleepy   Intake/Output Summary (Last 24 hours) at 06/15/16 1301 Last data filed at 06/15/16 1010  Gross per 24 hour  Intake              480 ml  Output             1200 ml  Net             -720 ml     Labs: CBC  Recent Labs  06/13/16 0444  WBC 33.7*  HGB 12.3*  HCT 41.0  MCV 64.8*  PLT 209   Basic Metabolic Panel  Recent Labs  06/14/16 0923 06/15/16 0331  NA 137 138  K 5.1 4.7  CL 92* 92*  CO2 35* 35*  GLUCOSE 145* 119*  BUN 88* 87*  CREATININE 1.95* 1.97*  CALCIUM 9.2 9.4   Liver Function Tests No results for input(s): AST, ALT, ALKPHOS, BILITOT, PROT, ALBUMIN in the last 72 hours. No results for input(s): LIPASE, AMYLASE in the last 72 hours. Cardiac Enzymes  No results for input(s): CKTOTAL, CKMB, CKMBINDEX, TROPONINI in the last 72 hours. BNP Invalid input(s): POCBNP D-Dimer No results for input(s): DDIMER in the last 72 hours. Hemoglobin A1C No results for input(s): HGBA1C in the last 72 hours. Fasting Lipid Panel No results for input(s): CHOL, HDL, LDLCALC, TRIG, CHOLHDL, LDLDIRECT in the last 72 hours. Thyroid Function Tests No results for input(s): TSH, T4TOTAL, T3FREE, THYROIDAB in the last 72 hours.  Invalid input(s): FREET3  Weights: Filed Weights   06/13/16 0523 06/14/16 0356 06/15/16 0449  Weight: 166 lb 8 oz (75.5 kg) 170 lb 3.2 oz (77.2 kg) 173 lb 3.2 oz (78.6 kg)     Radiology/Studies:  Ct Abdomen Pelvis Wo  Contrast   IMPRESSION: 1. No explanation for patient's hematuria. Specifically, normal noncontrast appearance of the urinary bladder given degree of distention. No evidence of urinary obstruction. 2. Small bilateral effusions, small amount of intra-abdominal ascites and a large amount of body wall subcutaneous edema, findings are nonspecific though favored to represent congestive heart failure. Clinical correlation is advised. 3. Similar findings of splenomegaly without definitive morphologic changes of cirrhosis on this noncontrast exam. 4. Bilateral nonobstructing nephrolithiasis. 5. Grossly unchanged bilateral hypoattenuating renal lesions, incompletely characterized on this noncontrast examination though morphologically similar to the 01/2016 exam and favored to represent renal cysts. 6. Coronary artery calcifications. Aortic Atherosclerosis (ICD10-170.0) Electronically Signed   By: Sandi Mariscal M.D.   On: 06/10/2016 11:04   Dg Chest 1 View  Result Date: 06/07/2016 CLINICAL DATA:  Pneumonia. History of hypertension, congestive heart failure and aortic stenosis. EXAM: CHEST 1 VIEW COMPARISON:  05/31/2016 and 01/12/2016 radiographs. FINDINGS: 1052 hour. The heart size and mediastinal contours are stable. There is aortic atherosclerosis. There is chronic vascular congestion with right-greater-than-left pleural thickening and mild basilar scarring. No overt pulmonary edema, confluent airspace opacity or pneumothorax. The bones appear unchanged. IMPRESSION: Stable examination with chronic vascular congestion and chronic pleural thickening. No acute findings. Electronically Signed   By: Richardean Sale M.D.   On: 06/07/2016 11:20   Dg Chest 2 View  Result Date: 05/31/2016 CLINICAL DATA:  Shortness of breath, abdominal swelling. History of prostate cancer, hypertension. EXAM: CHEST  2 VIEW COMPARISON:  Chest radiograph January 12, 2016 FINDINGS: The cardiac silhouette is mildly enlarged unchanged. Mediastinal  silhouette is nonsuspicious, calcified aortic knob. Persistently elevated RIGHT hemidiaphragm with RIGHT lung base scarring and bibasilar pleural thickening. Trachea projects midline and there is no pneumothorax. Soft tissue planes and included osseous structures are nonsuspicious. IMPRESSION: Stable examination: RIGHT lung base scarring with pleural thickening, no acute cardiopulmonary process. Electronically Signed   By: Elon Alas M.D.   On: 05/31/2016 23:09   Dg Abd 1 View  Result Date: 06/11/2016 CLINICAL DATA:  History of constipation and abdominal pain EXAM: ABDOMEN - 1 VIEW COMPARISON:  06/10/2016 FINDINGS: Scattered large and small bowel gas is noted. Fecal material is noted within the colon similar to that seen on recent CT. No free air is noted. No abnormal mass is seen. Degenerative change of the lumbar spine is noted. IMPRESSION: No acute abnormality noted. Electronically Signed   By: Inez Catalina M.D.   On: 06/11/2016 08:34     Assessment and Plan  81 y.o. male  1. Acute on chronic diastolic CHF: Case discussed with nephrology Recent climb in creatinine 1.9 likely from volume contraction Still with woody anasarca thighs and sacrum He may benefit from diuretic holiday for 2 days Been would restart torsemide 40 mg twice a day  with close renal function monitoring as an outpatient  2. Aortic stenosis: Moderate aortic valve stenosis on a peak velocity Mild stenosis by eating gradient, peak gradient Medical management at this time  3. Acute on CKD stage III: Followed by nephrology, worsening renal function with aggressive diuresis Creatinine up to 1.9 Would favor holding torsemide for 2 days then restarting  4) chronic pain, knee He is on high-dose morphine, Percocet also on Ativan for anxiety Would recommend weaning some of the medication given he is high fall risk  5. Dispo: -Consider Palliative care  High risk of readmission given numerous medical issues, failure  to thrive, multi-system involvement We have signed the order for home health Ideally feel it would be better for him to be in skilled nursing if not home with hospice   Total encounter time more than 45 minutes  Greater than 50% was spent in counseling and coordination of care with the patient   Signed, Esmond Plants, MD, Ph.D. [facility] 06/15/2016, 1:01 PM

## 2016-06-15 NOTE — Progress Notes (Signed)
Patient discharged per MD order and hospital protocol. Patient's wife verbalized understanding of discharge instructions, medications and follow up appointments. Patient going home with foley attached to leg bag. Instructed to call Urologist office if the foley is dislodged. Educated on home oxygen and patient on 2L nasal cannula. Patient given heart failure packet and prescriptions. Patient escorted off the unit via staff.

## 2016-06-15 NOTE — Progress Notes (Signed)
[facility]  ROUNDING NOTE   Subjective:  Patientresting comfortably in bed. Wife at bedside. Still has considerable edema. Case discussed with cardiology. We have decided to give him a diuretic holiday.   Objective:  Vital signs in last 24 hours:  Temp:  [97.5 F (36.4 C)-98.1 F (36.7 C)] 97.7 F (36.5 C) (03/15 1336) Pulse Rate:  [80-92] 92 (03/15 1336) Resp:  [18-19] 18 (03/15 1336) BP: (96-119)/(50-55) 119/55 (03/15 1336) SpO2:  [96 %-100 %] 100 % (03/15 1336) Weight:  [78.6 kg (173 lb 3.2 oz)] 78.6 kg (173 lb 3.2 oz) (03/15 0449)  Weight change: 1.361 kg (3 lb) Filed Weights   06/13/16 0523 06/14/16 0356 06/15/16 0449  Weight: 75.5 kg (166 lb 8 oz) 77.2 kg (170 lb 3.2 oz) 78.6 kg (173 lb 3.2 oz)    Intake/Output: I/O last 3 completed shifts: In: 526.3 [P.O.:480; I.V.:46.3] Out: 1600 [Urine:1600]   Intake/Output this shift:  Total I/O In: 240 [P.O.:240] Out: 550 [Urine:550]  Physical Exam: General: NAD  Head: Moist oral mucosal membranes, reddish tongue  Eyes: Anicteric  Neck: Supple, trachea midline, +JVD  Lungs:  Scattered rhonchi, normal effort   Heart: +3/6 crescendo murmur, irregular rhythm  Abdomen:  Soft, nontender, Distended, abdominal wall edema ++  Extremities: UNNA Boots bilaterally, 2+ peripheral edema up to lower abdomen  Neurologic: Nonfocal, moving all four extremities  GU: Foley in place        Basic Metabolic Panel:  Recent Labs Lab 06/11/16 0432 06/12/16 0411 06/13/16 0444 06/14/16 0923 06/15/16 0331  NA 136 138 140 137 138  K 4.2 3.8 4.5 5.1 4.7  CL 90* 91* 91* 92* 92*  CO2 34* 37* 38* 35* 35*  GLUCOSE 121* 115* 144* 145* 119*  BUN 81* 85* 83* 88* 87*  CREATININE 1.76* 1.74* 1.76* 1.95* 1.97*  CALCIUM 9.0 9.2 9.4 9.2 9.4    Liver Function Tests: No results for input(s): AST, ALT, ALKPHOS, BILITOT, PROT, ALBUMIN in the last 168 hours. No results for input(s): LIPASE, AMYLASE in the last 168 hours. No  results for input(s): AMMONIA in the last 168 hours.  CBC:  Recent Labs Lab 06/09/16 0414 06/10/16 0535 06/11/16 0432 06/12/16 0411 06/13/16 0444  WBC 26.5* 26.9* 32.7* 30.2* 33.7*  HGB 12.9* 12.7* 12.5* 12.1* 12.3*  HCT 43.9 43.0 40.9 40.1 41.0  MCV 64.4* 64.3* 64.5* 63.6* 64.8*  PLT 384 382 360 382 400    Cardiac Enzymes: No results for input(s): CKTOTAL, CKMB, CKMBINDEX, TROPONINI in the last 168 hours.  BNP: Invalid input(s): POCBNP  CBG:  Recent Labs Lab 06/10/16 0804  GLUCAP 116*    Microbiology: Results for orders placed or performed during the hospital encounter of 05/31/16  Blood culture (routine x 2)     Status: None   Collection Time: 06/01/16 12:20 AM  Result Value Ref Range Status   Specimen Description BLOOD  L WRIST  Final   Special Requests BOTTLES DRAWN AEROBIC AND ANAEROBIC  ADEQUATE   Final   Culture NO GROWTH 5 DAYS  Final   Report Status 06/06/2016 FINAL  Final  Blood culture (routine x 2)     Status: None   Collection Time: 06/01/16  1:11 AM  Result Value Ref Range Status   Specimen Description BLOOD  R FOREARM  Final   Special Requests BOTTLES DRAWN AEROBIC AND ANAEROBIC  ADEQUATE  Final   Culture NO GROWTH 5 DAYS  Final   Report Status 06/06/2016 FINAL  Final  Coagulation Studies: No results for input(s): LABPROT, INR in the last 72 hours.  Urinalysis: No results for input(s): COLORURINE, LABSPEC, PHURINE, GLUCOSEU, HGBUR, BILIRUBINUR, KETONESUR, PROTEINUR, UROBILINOGEN, NITRITE, LEUKOCYTESUR in the last 72 hours.  Invalid input(s): APPERANCEUR    Imaging: No results found.   Medications:    . docusate sodium  200 mg Oral BID  . feeding supplement (ENSURE ENLIVE)  237 mL Oral TID BM  . ferrous gluconate  324 mg Oral BID WC  . levothyroxine  25 mcg Oral QAC breakfast  . morphine  30 mg Oral TID WC  . polyethylene glycol  17 g Oral Daily  . sodium chloride flush  3 mL Intravenous Q12H  . tamsulosin  0.4 mg Oral QPC  supper  . [START ON 06/19/2016] torsemide  40 mg Oral BID   sodium chloride, acetaminophen **OR** acetaminophen, alum & mag hydroxide-simeth, bisacodyl, HYDROcodone-acetaminophen, LORazepam, ondansetron **OR** ondansetron (ZOFRAN) IV, opium-belladonna, phenazopyridine, senna-docusate, sodium chloride, sodium chloride flush  Assessment/ Plan:  [name] is a 81 y.o. white male with diastolic congestive heart failure, hypertension, CVA, BPH, , who was admitted to [facility] on 05/31/2016   1. Acute renal failure on chronic kidney disease stage III: with proteinuria, anasarca.  Chronic kidney disease secondary to hypertension. Acute renal failure from acute cardiorenal syndrome from diastolic congestive heart failure.  - case discussed with cardiology today.  BUN and creatinine remain quite high likely secondary to aggressive diuresis.  We will give him a diuretic holiday for several days.  We will need to continue to use mechanical means to treat his underlying edema.  2.  Anasarca/generalized edema; Cardiac - aortic stenosis. Gr 2 diastolic dysfunction; EF 31-51%, Pulm HTN and RV pressure overload -patient temporarily taken off of torsemide as above.  Continue to monitor his edema status daily.  Hopefully by giving him a diuretic holiday his renal function will improve.  3.  Gross hematuria:  Improved, now off CBI.        LOS: 14 Nathan Kramer 3/15/20183:44 PM

## 2016-06-15 NOTE — Discharge Summary (Addendum)
[facility] NAME: Nathan Kramer    MR#:  [id number]  DATE OF BIRTH:  1935/12/23  DATE OF ADMISSION:  05/31/2016 ADMITTING PHYSICIAN: Saundra Shelling, MD  DATE OF DISCHARGE: No discharge date for patient encounter.  PRIMARY CARE PHYSICIAN: HANDE,VISHWANATH, MD   ADMISSION DIAGNOSIS:  Acute on chronic systolic congestive heart failure (HCC) [I50.23] Cellulitis of lower extremity, unspecified laterality [L03.119]  DISCHARGE DIAGNOSIS:  Principal Problem:   Acute on chronic diastolic CHF (congestive heart failure), NYHA class 3 (HCC) Active Problems:   Aortic stenosis, moderate   HTN (hypertension)   Acute kidney injury superimposed on CKD (HCC)   Cellulitis   Dietary noncompliance   Gross hematuria   SECONDARY DIAGNOSIS:   Past Medical History:  Diagnosis Date  . Anemia   . Aortic insufficiency   . Aortic stenosis   . Chronic diastolic CHF (congestive heart failure) ([place]) 01/2016  . Mitral regurgitation   . Other chronic pain   . Prostate cancer ([place])   . Reflux esophagitis   . Stroke ([place])   . Unspecified essential hypertension   . Unspecified visual loss      ADMITTING HISTORY  HISTORY OF PRESENT ILLNESS: Nathan Kramer  is a 81 y.o. male with a known history of Congestive heart failure, chronic kidney disease, anemia, prostate cancer, hypertension, CVA presented to the emergency room with swelling in both the lower legs along with redness and weeping lesions in the lower extremities. Patient has serous sanguinous fluid weeping from the lower extremity skin. Her patient also complains of shortness of breath for the last couple of weeks. Has history of orthopnea. No complaints of any chest pain. He was evaluated in the emergency room his BNP was elevated. He was given IV Lasix for diuresis and IV vancomycin and divided was started for lower extremity cellulitis. Troponin was elevated but patient also had elevation in  creatinine. Elevated troponin could be secondary to demand ischemia and renal insufficiency. EKG normal sinus rhythm with no ST segment elevation. Patient was put on oxygen via nasal cannula in the emergency room. Hospitalist service was consulted for further care of the patient.   HOSPITAL COURSE:   81 year old male with past medical history of hypertension, pulmonary hypertension, diastolic CHF, moderate aortic stenosis, history of GERD, prostate cancer who presented to the hospital due to shortness of breath and worsening lower extremity edema.  * Acute on chronic diastolic dysfunction with underlying pulmonary hypertension/anasarca:  improved since admission.  - Creatinine trended up stable - Appreciate cardiology and nephrology consultation.  Followed I's and O's and daily weights. About 12.5 L (-) since admission.  Stable for discharge home on Torsemide  * Hematuria: s/p CBI and hematuria has resolved now.   - Appreciate urology consultation. Hg. Stable.  - pt. Will need to go home with foley and they will do voiding trial as outpatient.. - Further management as per UROLOGY outpatient  * Moderate/Severe  Aortic Stenosis contributing to CHF: As per Cardiology, he is a poor surgical candidate.  - Repeat Echo showed no new changes.   - He will follow up with Cardiology as outpatient.   * CKD Stage III Close to normal.  * Leukocytosis - unclear etiology of leukocytosis.  Patient is without fever since admission  Urinalysis and chest x-ray were negative for infection. WBC increasing but no clear source and will cont. To monitor. Follow up with Hem/Onc as outpatient.  Blood cultures negative  * Hypothyroidism:  Continue Synthroid.  * LE edema due to CHF and venous stasis - s/p UNNA boots as per wound team.   - cont. torsemide.   Change every week  * Elevated troponin due to demand ischemia and not ACS  Stable for discharge home. High risk for readmission. Will need  palliative care following the patient at home.  CONSULTS OBTAINED:  Treatment Team:  Wellington Hampshire, MD Lavonia Dana, MD Thressa Sheller, MD Cleon Gustin, MD  DRUG ALLERGIES:   Allergies  Allergen Reactions  . Amoxicillin     Other reaction(s): Localized superficial swelling of skin Face swelling   . Reglan [Metoclopramide]     Involuntary movements body    DISCHARGE MEDICATIONS:   Current Discharge Medication List    START taking these medications   Details  LORazepam (ATIVAN) 0.5 MG tablet Take 1 tablet (0.5 mg total) by mouth 2 (two) times daily as needed for anxiety. Qty: 10 tablet, Refills: 0      CONTINUE these medications which have CHANGED   Details  morphine (MS CONTIN) 30 MG 12 hr tablet Take 1 tablet (30 mg total) by mouth 3 (three) times daily with meals. Qty: 21 tablet, Refills: 0      CONTINUE these medications which have NOT CHANGED   Details  diphenhydrAMINE (BENADRYL) 25 MG tablet Take 25 mg by mouth as needed.     esomeprazole (NEXIUM) 10 MG packet Take 20 mg by mouth daily before breakfast.    Ferrous Gluconate (IRON 27 PO) Take 1 tablet by mouth daily.    KLOR-CON M20 20 MEQ tablet TAKE 1 TABLET (20 MEQ TOTAL) BY MOUTH DAILY. Qty: 90 tablet, Refills: 3   Associated Diagnoses: Essential hypertension; Acute on chronic diastolic CHF (congestive heart failure), NYHA class 2 (HCC)    levothyroxine (SYNTHROID, LEVOTHROID) 25 MCG tablet Take 25 mcg by mouth daily before breakfast.    metolazone (ZAROXOLYN) 2.5 MG tablet Take 2.5 mg by mouth every 7 (seven) days.    Polyethylene Glycol 3350 (CLEARLAX PO) Take 1 packet by mouth daily.    torsemide (DEMADEX) 20 MG tablet Take 2 tablets (40 mg total) by mouth daily. Qty: 60 tablet, Refills: 3        Today   VITAL SIGNS:  Blood pressure (!) 119/55, pulse 92, temperature 97.7 F (36.5 C), temperature source Oral, resp. rate 18, height 5\' 8"  (1.727 m), weight 78.6 kg (173 lb 3.2 oz),  SpO2 100 %.  I/O:   Intake/Output Summary (Last 24 hours) at 06/15/16 1342 Last data filed at 06/15/16 1010  Gross per 24 hour  Intake              480 ml  Output             1200 ml  Net             -720 ml    PHYSICAL EXAMINATION:  Physical Exam  GENERAL:  81 y.o.-year-old patient lying in the bed with no acute distress.  LUNGS: Normal breath sounds bilaterally, no wheezing, rales,rhonchi or crepitation. No use of accessory muscles of respiration.  CARDIOVASCULAR: S1, S2 normal.  Ejection murmur ABDOMEN: Soft, non-tender, non-distended. Bowel sounds present. No organomegaly or mass.  NEUROLOGIC: Moves all 4 extremities. PSYCHIATRIC: The patient is alert and awake. anxious  Foley catheter in place.  DATA REVIEW:   CBC  Recent Labs Lab 06/13/16 0444  WBC 33.7*  HGB 12.3*  HCT 41.0  PLT 400  Chemistries   Recent Labs Lab 06/15/16 0331  NA 138  K 4.7  CL 92*  CO2 35*  GLUCOSE 119*  BUN 87*  CREATININE 1.97*  CALCIUM 9.4    Cardiac Enzymes No results for input(s): TROPONINI in the last 168 hours.  Microbiology Results  Results for orders placed or performed during the hospital encounter of 05/31/16  Blood culture (routine x 2)     Status: None   Collection Time: 06/01/16 12:20 AM  Result Value Ref Range Status   Specimen Description BLOOD  L WRIST  Final   Special Requests BOTTLES DRAWN AEROBIC AND ANAEROBIC  ADEQUATE   Final   Culture NO GROWTH 5 DAYS  Final   Report Status 06/06/2016 FINAL  Final  Blood culture (routine x 2)     Status: None   Collection Time: 06/01/16  1:11 AM  Result Value Ref Range Status   Specimen Description BLOOD  R FOREARM  Final   Special Requests BOTTLES DRAWN AEROBIC AND ANAEROBIC  ADEQUATE  Final   Culture NO GROWTH 5 DAYS  Final   Report Status 06/06/2016 FINAL  Final    RADIOLOGY:  No results found.  Follow up with PCP in 1 week.  Management plans discussed with the patient, family and they are in  agreement.  CODE STATUS:     Code Status Orders        Start     Ordered   06/01/16 0252  Do not attempt resuscitation (DNR)  Continuous    Question Answer Comment  In the event of cardiac or respiratory ARREST Do not call a "code blue"   In the event of cardiac or respiratory ARREST Do not perform Intubation, CPR, defibrillation or ACLS   In the event of cardiac or respiratory ARREST Use medication by any route, position, wound care, and other measures to relive pain and suffering. May use oxygen, suction and manual treatment of airway obstruction as needed for comfort.      06/01/16 0251    Code Status History    Date Active Date Inactive Code Status Order ID Comments User Context   01/11/2016  2:26 PM 01/16/2016  3:06 PM Full Code [id number]  Bettey Costa, MD Inpatient   01/11/2016 12:36 PM 01/11/2016  2:26 PM DNR [id number]  Bettey Costa, MD ED    Advance Directive Documentation     Most Recent Value  Type of Advance Directive  Living will  Pre-existing out of facility DNR order (yellow form or pink MOST form)  -  "MOST" Form in Place?  -      TOTAL TIME TAKING CARE OF THIS PATIENT ON DAY OF DISCHARGE: more than 30 minutes.   Hillary Bow R M.D on 06/15/2016 at 1:42 PM  Between 7am to 6pm - Pager - [phone]  After 6pm go to [email or web address] - password [email or web address] [facility]  Office  [phone]  CC: Primary care physician; Tracie Harrier, MD  Note: This dictation was prepared with Dragon dictation along with smaller phrase technology. Any transcriptional errors that result from this process are unintentional.

## 2016-06-15 NOTE — Progress Notes (Signed)
Patient is refusing to have unna boots placed on his legs. He says he will wait until he gets home

## 2016-06-15 NOTE — Telephone Encounter (Signed)
Spoke with patients wife and she is not sure if patient is able to come to all of those appointments because he is just so weak. Let her know that I would check on the appointments and then call her back later. She was appreciative for the call and had no further questions at this time.

## 2016-06-15 NOTE — Telephone Encounter (Signed)
Patient currently admitted

## 2016-06-15 NOTE — Care Management Note (Signed)
Case Management Note  Patient Details  Name: Nathan Kramer MRN: [id number] Date of Birth: 06/11/1935  Subjective/Objective:           For discharge home today.  Discussed the need for home health orders regarding weekly unna boot changes and foley management in the event of occlusion or accidental expulsion  .  Portable 02 tank has been delivered to patient's room.  Wife will need to sign the paper work when she comes to transport patient home.  Added outpatient palliative care .    Action/Plan:   Expected Discharge Date:  06/15/16               Expected Discharge Plan:  [place] and Palliative Care  In-House Referral:     Discharge planning Services  CM Consult, HF Clinic  Post Acute Care Choice:  Durable Medical Equipment, Home Health Choice offered to:  Patient  DME Arranged:  Oxygen DME Agency:  [place]:  PT, RN, Nurse's Aide, Social Work ([place]) [place]:  [place]  Status of Service:  Completed, signed off  If discussed at [employer] of Stay Meetings, dates discussed:    Additional Comments:  Katrina Stack, RN 06/15/2016, 2:30 PM

## 2016-06-15 NOTE — Progress Notes (Signed)
Physical Therapy Treatment Patient Details Name: Nathan Kramer MRN: [id number] DOB: Aug 13, 1935 Today's Date: 06/15/2016    History of Present Illness Nathan Kramer is a 81 y.o. male with a known history of congestive heart failure, chronic kidney disease, anemia, prostate cancer, hypertension, CVA presented to the emergency room with swelling in both the lower legs along with redness and weeping lesions in the lower extremities. Patient has serous sanguineous fluid weeping from the lower extremity skin.  He also complained of shortness of breath for the last couple of weeks. Has history of orthopnea. He was evaluated in the emergency room his BNP was elevated. He was given IV Lasix for diuresis and IV vancomycin and divided was started for lower extremity cellulitis. Troponin was elevated but patient also had elevation in creatinine. Elevated troponin could be secondary to demand ischemia and renal insufficiency. EKG normal sinus rhythm with no ST segment elevation. Patient was put on oxygen via nasal cannula in the emergency room. Hospitalist service was consulted for further care of the patient. Pt is now admitted for acute on chronic CHF with history of moderate/severe aortic stenosis. He is eager to work with physical therapy on this date.     PT Comments    Pt requesting to use bathroom - "I need to pee!"  Explained catheter several times during session but he continued to focus on it.  Difficulty with BM.  Assist for care.   Bed mobility with min a x 1.  Attempting to pull up on writers neck.  Re-directed for proper hand placements and technique.  He was able to ambulate around nursing unit with RW and min guard.  Assist for O2.  Pt with some decreased gait quality today that wife attributes to meds over night.    Discussed discharge plan with wife.  She stated she is comfortable with d/c plan and will have family in to help.  Encouraged continuing with RW upon discharge for safety.    Follow Up Recommendations  Home health PT     Equipment Recommendations  Rolling walker with 5" wheels    Recommendations for Other Services       Precautions / Restrictions Precautions Precautions: Fall Restrictions Weight Bearing Restrictions: No    Mobility  Bed Mobility Overal bed mobility: Modified Independent                Transfers Overall transfer level: Needs assistance Equipment used: Rolling walker (2 wheeled) Transfers: Sit to/from Stand Sit to Stand: Supervision         General transfer comment: impulsive and hurried stating he needed to use bedside for BM and to urinate- pt with catherter   Ambulation/Gait Ambulation/Gait assistance: Min guard Ambulation Distance (Feet): 200 Feet Assistive device: Rolling walker (2 wheeled)     Gait velocity interpretation: <1.8 ft/sec, indicative of risk for recurrent falls General Gait Details: O2 on 2 lpm - sats remained mid 90's during gait.  Overall decreased gait quality this am which wife attributed to meds over night.   Stairs            Wheelchair Mobility    Modified Rankin (Stroke Patients Only)       Balance Overall balance assessment: Needs assistance Sitting-balance support: No upper extremity supported;Feet supported Sitting balance-Leahy Scale: Good Sitting balance - Comments: able to maintain seated posture w/o back support slightly forwardly flexed    Standing balance support: Bilateral upper extremity supported;During functional activity Standing balance-Leahy Scale: Good  Cognition Arousal/Alertness: Awake/alert Behavior During Therapy: WFL for tasks assessed/performed;Impulsive Overall Cognitive Status: Within Functional Limits for tasks assessed                      Exercises Other Exercises Other Exercises: toilet transfer.  assist for hygiene.    General Comments        Pertinent Vitals/Pain Pain Assessment: 0-10 Pain  Score: 8  Pain Location: pennile and rectal region during bowel movement Pain Descriptors / Indicators: Aching;Burning;Grimacing Pain Intervention(s): Monitored during session    Home Living                      Prior Function            PT Goals (current goals can now be found in the care plan section) Progress towards PT goals: Progressing toward goals    Frequency    Min 2X/week      PT Plan Current plan remains appropriate    Co-evaluation             End of Session Equipment Utilized During Treatment: Gait belt;Oxygen Activity Tolerance: Patient tolerated treatment well Patient left: in bed;with call bell/phone within reach         Time: 1004-1019 PT Time Calculation (min) (ACUTE ONLY): 15 min  Charges:  $Gait Training: 8-22 mins                    G Codes:       Chesley Noon, PTA 06/15/16, 11:09 AM

## 2016-06-15 NOTE — Telephone Encounter (Signed)
-----   Message from Blain Pais sent at 06/15/2016 11:30 AM EDT ----- Regarding: tcm/ph 3/27 10:20 Dr. Rockey Situ

## 2016-06-19 ENCOUNTER — Ambulatory Visit: Payer: Medicare Other | Admitting: Family

## 2016-06-20 ENCOUNTER — Telehealth: Payer: Self-pay | Admitting: Cardiology

## 2016-06-20 NOTE — Telephone Encounter (Signed)
Nathan Kramer calling from [facility] calling to report change in [name] around abdominal    On 06/16/16 it was about 109 cm Today is was 112 cm No change in weight  Everything else is normal Would like to just let us know  Pt is to come in to see [place] this week.

## 2016-06-20 NOTE — Telephone Encounter (Signed)
Spoke with nurse and she states that the patient was in good spirits and seemed to be doing well. She states that his weight has not changed but his abdominal girth did go up. Let her know that patient is scheduled to see Dr. Yvone Neu on 3/22 and that I will make her aware of this. She was appreciative for the call back and has no further questions at this time.

## 2016-06-21 NOTE — Progress Notes (Signed)
Cardiology Office Note   Date:  06/22/2016   ID:  Nathan Kramer, DOB 05/09/1935, MRN [id number]  Referring Doctor:  Tracie Harrier, MD   Cardiologist:   Wende Bushy, MD   Reason for consultation:  Chief Complaint  Patient presents with  . OTHER    ffup after hospitalization, CHF. Meds reviewed verbally with pt.      History of Present Illness: Nathan Kramer is a 81 y.o. male who presents for Follow-up for CHF After hospital stay.  Since last visit, patient ended up being admitted to the hospital for significant volume overload. He stayed approximately 15-16 days to get diuresed. Apart from the CHF, he also had ongoing chronic kidney disease.  He has a visiting nurse at home. They keep track of his weights. They have been stable so far. He continues to have leg swelling but improved from previous.  He has been seeing a lot of physicians including a urologist for his prostate problem. He will be seeing nephrology tomorrow.   ROS:  Please see the history of present illness. Aside from mentioned under HPI, all other systems are reviewed and negative.   Past Medical History:  Diagnosis Date  . Anemia   . Aortic insufficiency   . Aortic stenosis   . Chronic diastolic CHF (congestive heart failure) ([place]) 01/2016  . Mitral regurgitation   . Other chronic pain   . Prostate cancer ([place])   . Reflux esophagitis   . Stroke ([place])   . Unspecified essential hypertension   . Unspecified visual loss     Past Surgical History:  Procedure Laterality Date  . back surgery    . COLONOSCOPY       reports that he has been smoking Cigars.  He has a 75.00 pack-year smoking history. He has never used smokeless tobacco. He reports that he does not drink alcohol or use drugs.   family history includes Cancer in his sister; Diabetes in his mother; Heart disease in his brother and father; Hypertension in his father and mother.   Current Outpatient Prescriptions  Medication Sig  Dispense Refill  . diphenhydrAMINE (BENADRYL) 25 MG tablet Take 25 mg by mouth as needed.     [name] esomeprazole (NEXIUM) 10 MG packet Take 20 mg by mouth daily before breakfast.    . Ferrous Gluconate (IRON 27 PO) Take 1 tablet by mouth daily.    [name] KLOR-CON M20 20 MEQ tablet TAKE 1 TABLET (20 MEQ TOTAL) BY MOUTH DAILY. 90 tablet 3  . levothyroxine (SYNTHROID, LEVOTHROID) 25 MCG tablet Take 25 mcg by mouth daily before breakfast.    . metolazone (ZAROXOLYN) 2.5 MG tablet Take 2.5 mg by mouth every 7 (seven) days.    [name] morphine (MS CONTIN) 30 MG 12 hr tablet Take 1 tablet (30 mg total) by mouth 3 (three) times daily with meals. 21 tablet 0  . Polyethylene Glycol 3350 (CLEARLAX PO) Take 1 packet by mouth daily.    [name] torsemide (DEMADEX) 20 MG tablet Take 2 tablets (40 mg total) by mouth daily. 60 tablet 3   No current facility-administered medications for this visit.     Allergies: Amoxicillin and Reglan [metoclopramide]    PHYSICAL EXAM: VS:  BP 98/60 (BP Location: Right Arm, Patient Position: Sitting, Cuff Size: Normal)   Pulse 85   Ht 5' 8"  (1.727 m)   Wt 165 lb (74.8 kg)   BMI 25.09 kg/m  , Body mass index is 25.09 kg/m. Wt Readings from Last  3 Encounters:  06/22/16 165 lb (74.8 kg)  06/22/16 165 lb (74.8 kg)  06/15/16 173 lb 3.2 oz (78.6 kg)  GENERAL:  well developed, well nourished, obese, not in acute distress HEENT: normocephalic, pink conjunctivae, anicteric sclerae, no xanthelasma, normal dentition, oropharynx clear NECK:  no neck vein engorgement, JVP normal, no hepatojugular reflux, carotid upstroke brisk and symmetric, no bruit, no thyromegaly, no lymphadenopathy LUNGS:  good respiratory effort, clear to auscultation bilaterally CV:  PMI not displaced, no thrills, no lifts, S1 and S2 within normal limits, no palpable S3 or S4, no murmurs, no rubs, no gallops ABD:  Soft, nontender, nondistended, normoactive bowel sounds, no abdominal aortic bruit, no hepatomegaly, no  splenomegaly MS: nontender back, no kyphosis, no scoliosis, no joint deformities EXT:  2+ DP/PT pulses, ++ edema, no varicosities, no cyanosis, no clubbing SKIN: warm, nondiaphoretic, normal turgor, no ulcers NEUROPSYCH: alert, oriented to person, place, and time, sensory/motor grossly intact, normal mood, appropriate affect   Recent Labs: 05/31/2016: B Natriuretic Peptide 1,557.0 06/03/2016: ALT 11 06/13/2016: Hemoglobin 12.3; Platelets 400 06/15/2016: BUN 87; Creatinine, Ser 1.97; Potassium 4.7; Sodium 138   Lipid Panel No results found for: CHOL, TRIG, HDL, CHOLHDL, VLDL, LDLCALC, LDLDIRECT   Other studies Reviewed:  EKG:  The ekg from 08/24/2015 was personally reviewed by me and it revealed sinus rhythm, 81 BPM. T wave inversions. Poor R-wave progression.  EKG from 12/27/2015 was personally reviewed by me and showed normal sinus rhythm, 79 BPM. T wave inversion. QT 400 ms, QTC 433 ms, within normal limits.  EKG from 05/30/2016 was personally reviewed by me and showed sinus rhythm, 88 BPM. Nonspecific ST-T wave changes. QT 320 ms, QTC 369 ms, within normal limits.  Additional studies/ records that were reviewed personally reviewed by me today include:  Echo 09/07/2015: Left ventricle: The cavity size was normal. There was mild  concentric hypertrophy. Systolic function was normal. The  estimated ejection fraction was in the range of 55% to 60%. Wall  motion was normal; there were no regional wall motion  abnormalities. Features are consistent with a pseudonormal left  ventricular filling pattern, with concomitant abnormal relaxation  and increased filling pressure (grade 2 diastolic dysfunction). - Aortic valve: There was moderate to severe stenosis. There was  moderate regurgitation. Mean gradient (S): 25 mm Hg. Valve area  (VTI): 0.84 cm^2. Valve area (Vmax): 0.81 cm^2. - Aorta: Ascending aortic diameter: 37 mm (S). - Mitral valve: Calcified annulus. Mildly thickened,  moderately  calcified leaflets . The findings are consistent with mild  stenosis. Valve area by continuity equation (using LVOT flow):  1.84 cm^2. - Left atrium: The atrium was mildly dilated. - Pulmonary arteries: Systolic pressure was moderately increased.  PA peak pressure: 55 mm Hg (S).  Aortic valve Value Reference Aortic valve peak velocity, S 313 cm/s --------- Aortic valve mean velocity, S 237 cm/s --------- Aortic valve VTI, S 72.2 cm --------- Aortic mean gradient, S 25 mm Hg --------- Aortic peak gradient, S 39 mm Hg --------- VTI ratio, LVOT/AV 0.31 --------- Aortic valve area, VTI 0.84 cm^2 --------- Aortic valve area/bsa, VTI 0.45 cm^2/m^2 --------- Velocity ratio, peak, LVOT/AV 0.32 --------- Aortic valve area, peak velocity 0.81 cm^2 --------- Aortic valve area/bsa, peak 0.43 cm^2/m^2 --------- velocity Velocity ratio, mean, LVOT/AV 0.28 --------- Aortic valve area, mean velocity 0.71 cm^2 --------- Aortic valve area/bsa, mean 0.38 cm^2/m^2 --------- velocity Aortic regurg pressure half-time 302 ms ---------  Nuclear stress is 10/04/2015: Pharmacological myocardial perfusion imaging study with no significant  ischemia  GI uptake artifact noted Normal wall motion, EF estimated at 50% (ejection fraction likely reduce secondary to artifact around the inferior wall) No EKG changes concerning for ischemia at peak stress or in recovery. Nonspecific ST abnormality noted at rest Low risk scan  Labs through PCP 05/11/2016: BUN 34, creatinine 1.9, potassium 4.7  Echo 06/01/2016: Study Conclusions  - Left  ventricle: The cavity size was normal. There was mild   concentric hypertrophy. Systolic function was normal. The   estimated ejection fraction was in the range of 50% to 55%. Wall   motion was normal; there were no regional wall motion   abnormalities. Features are consistent with a pseudonormal left   ventricular filling pattern, with concomitant abnormal relaxation   and increased filling pressure (grade 2 diastolic dysfunction). - Ventricular septum: The contour showed systolic flattening. These   changes are consistent with RV pressure overload. - Aortic valve: There was moderate to severe stenosis. There was   moderate regurgitation. Mean gradient (S): 22 mm Hg. Valve area   (VTI): 0.91 cm^2. - Mitral valve: Calcified annulus. Mildly thickened, mildly   calcified leaflets . There was mild regurgitation. - Left atrium: The atrium was mildly dilated. - Right atrium: The atrium was mildly dilated. - Pulmonary arteries: Systolic pressure was moderately increased.   PA peak pressure: 55 mm Hg (S).   ASSESSMENT AND PLAN:   CHF, preserved ejection fraction,  Chronic Underlying chronic kidney disease He is currently on torsemide 40 mg daily. With potassium replacement. We will give him a lab slip for CMP in case he goes for blood work tomorrow. Continue current medical therapy. Continue daily weights and low sodium diet.  If weight gain of > 2 lbs over 24 hours, or > 5 lbs over 1 week, please call office.   Aortic stenosis Aortic insufficiency Most recent echo reveals moderate to severe  as. Significant multiple medical comorbidities. Unlikely to be a good candidate for surgery We will continue to reevaluate.  Blood pressures have been borderline. He is only using diuretic at this point. Unable to use any ACE inhibitor, Aldactone, beta blocker.   Current medicines are reviewed at length with the patient today.  The patient does not have concerns regarding medicines.  Labs/  tests ordered today include:  Orders Placed This Encounter  Procedures  . Comp Met (CMET)  . EKG 12-Lead   Disposition:   FU with Cardiology In one month  Signed, Wende Bushy, MD  06/22/2016 4:23 PM    [place]

## 2016-06-22 ENCOUNTER — Ambulatory Visit (INDEPENDENT_AMBULATORY_CARE_PROVIDER_SITE_OTHER): Payer: Medicare Other | Admitting: Cardiology

## 2016-06-22 ENCOUNTER — Encounter: Payer: Self-pay | Admitting: Cardiology

## 2016-06-22 ENCOUNTER — Ambulatory Visit (INDEPENDENT_AMBULATORY_CARE_PROVIDER_SITE_OTHER): Payer: Medicare Other

## 2016-06-22 VITALS — BP 98/60 | HR 85 | Ht 68.0 in | Wt 165.0 lb

## 2016-06-22 VITALS — BP 104/64 | HR 89 | Ht 68.0 in | Wt 165.0 lb

## 2016-06-22 DIAGNOSIS — I350 Nonrheumatic aortic (valve) stenosis: Secondary | ICD-10-CM | POA: Diagnosis not present

## 2016-06-22 DIAGNOSIS — R339 Retention of urine, unspecified: Secondary | ICD-10-CM

## 2016-06-22 DIAGNOSIS — I5032 Chronic diastolic (congestive) heart failure: Secondary | ICD-10-CM

## 2016-06-22 NOTE — Progress Notes (Signed)
Fill and Pull Catheter Removal  Patient is present today for a catheter removal.  Patient was cleaned and prepped in a sterile fashion 144ml of sterile water/ saline was instilled into the bladder when the patient felt the urge to urinate. 60ml of water was then drained from the balloon.  A 24FR 3-way foley cath was removed from the bladder no complication noted.  Patient as then given some time to void on their own.  Patient cannot void.  Preformed by: Toniann Fail, LPN   Follow up/ Additional notes: Due to pt not being able to void and swelling of penis Dr. Pilar Jarvis was consulted.   Blood pressure 104/64, pulse 89, height 5\' 8"  (1.727 m), weight 165 lb (74.8 kg).

## 2016-06-22 NOTE — Progress Notes (Signed)
Patient failed TOV today. Nurse unable to replace foley due to significant penile edema. 24 Fr foley replaced by M.D. Repeat nurse visit for trial of void in one week.

## 2016-06-22 NOTE — Patient Instructions (Signed)
Labwork: Please have labs done with others that are ordered.    Follow-Up: Your physician recommends that you schedule a follow-up appointment in: 4 weeks with Dr. Yvone Neu.   It was a pleasure seeing you today here in the office. Please do not hesitate to give [place] a call back if you have any further questions. [place], BSN

## 2016-06-26 ENCOUNTER — Other Ambulatory Visit: Payer: Medicare Other

## 2016-06-26 ENCOUNTER — Telehealth: Payer: Self-pay | Admitting: Anesthesiology

## 2016-06-26 NOTE — Telephone Encounter (Signed)
Patient has been in and out of hospital for the last month with CHF. Was unable to come in for appts. The hospital phys gave them a script for 21 pills which is 7 days of pain meds. I sched patient for Tues at 2:30. Is it ok for them to get meds filled or should they 2wait til they come in tomorrow? Please let them know

## 2016-06-26 NOTE — Telephone Encounter (Signed)
Spoke with Mrs Chouinard, she states that she has enough medicine to last and will not need to get the additional Rx filled.  Mr Corbello is still week, instructed patient's wife that she could utilize courtesy parking in [facility] and they would assist her getting Mr Thomley out of car and over to our clinic.  She verbalizes u/o information.

## 2016-06-27 ENCOUNTER — Encounter: Payer: Medicare Other | Admitting: Cardiovascular Disease

## 2016-06-27 ENCOUNTER — Encounter: Payer: Self-pay | Admitting: Anesthesiology

## 2016-06-27 ENCOUNTER — Ambulatory Visit: Payer: Medicare Other | Attending: Anesthesiology | Admitting: Anesthesiology

## 2016-06-27 VITALS — BP 94/47 | HR 86 | Temp 97.7°F | Resp 16 | Ht 68.0 in | Wt 170.0 lb

## 2016-06-27 DIAGNOSIS — I080 Rheumatic disorders of both mitral and aortic valves: Secondary | ICD-10-CM | POA: Diagnosis not present

## 2016-06-27 DIAGNOSIS — K210 Gastro-esophageal reflux disease with esophagitis: Secondary | ICD-10-CM | POA: Diagnosis not present

## 2016-06-27 DIAGNOSIS — M5441 Lumbago with sciatica, right side: Secondary | ICD-10-CM | POA: Insufficient documentation

## 2016-06-27 DIAGNOSIS — N200 Calculus of kidney: Secondary | ICD-10-CM | POA: Diagnosis not present

## 2016-06-27 DIAGNOSIS — M48062 Spinal stenosis, lumbar region with neurogenic claudication: Secondary | ICD-10-CM | POA: Diagnosis not present

## 2016-06-27 DIAGNOSIS — M170 Bilateral primary osteoarthritis of knee: Secondary | ICD-10-CM | POA: Diagnosis not present

## 2016-06-27 DIAGNOSIS — G8929 Other chronic pain: Secondary | ICD-10-CM | POA: Insufficient documentation

## 2016-06-27 DIAGNOSIS — M5136 Other intervertebral disc degeneration, lumbar region: Secondary | ICD-10-CM | POA: Insufficient documentation

## 2016-06-27 DIAGNOSIS — M5030 Other cervical disc degeneration, unspecified cervical region: Secondary | ICD-10-CM | POA: Diagnosis not present

## 2016-06-27 DIAGNOSIS — M1288 Other specific arthropathies, not elsewhere classified, other specified site: Secondary | ICD-10-CM

## 2016-06-27 DIAGNOSIS — Z8673 Personal history of transient ischemic attack (TIA), and cerebral infarction without residual deficits: Secondary | ICD-10-CM | POA: Insufficient documentation

## 2016-06-27 DIAGNOSIS — Z8546 Personal history of malignant neoplasm of prostate: Secondary | ICD-10-CM | POA: Diagnosis not present

## 2016-06-27 DIAGNOSIS — M79604 Pain in right leg: Secondary | ICD-10-CM | POA: Diagnosis present

## 2016-06-27 DIAGNOSIS — Z8249 Family history of ischemic heart disease and other diseases of the circulatory system: Secondary | ICD-10-CM | POA: Diagnosis not present

## 2016-06-27 DIAGNOSIS — M545 Low back pain: Secondary | ICD-10-CM | POA: Diagnosis present

## 2016-06-27 DIAGNOSIS — I5032 Chronic diastolic (congestive) heart failure: Secondary | ICD-10-CM | POA: Insufficient documentation

## 2016-06-27 DIAGNOSIS — M5442 Lumbago with sciatica, left side: Secondary | ICD-10-CM | POA: Insufficient documentation

## 2016-06-27 DIAGNOSIS — I130 Hypertensive heart and chronic kidney disease with heart failure and stage 1 through stage 4 chronic kidney disease, or unspecified chronic kidney disease: Secondary | ICD-10-CM | POA: Insufficient documentation

## 2016-06-27 DIAGNOSIS — M79605 Pain in left leg: Secondary | ICD-10-CM | POA: Insufficient documentation

## 2016-06-27 DIAGNOSIS — D649 Anemia, unspecified: Secondary | ICD-10-CM | POA: Insufficient documentation

## 2016-06-27 DIAGNOSIS — Z87891 Personal history of nicotine dependence: Secondary | ICD-10-CM | POA: Insufficient documentation

## 2016-06-27 DIAGNOSIS — M47817 Spondylosis without myelopathy or radiculopathy, lumbosacral region: Secondary | ICD-10-CM

## 2016-06-27 MED ORDER — MORPHINE SULFATE ER 30 MG PO TBCR: 30.0000 mg | EXTENDED_RELEASE_TABLET | Freq: Three times a day (TID) | ORAL | 0 refills | Status: DC

## 2016-06-27 NOTE — Progress Notes (Signed)
Safety precautions to be maintained throughout the outpatient stay will include: orient to surroundings, keep bed in low position, maintain call bell within reach at all times, provide assistance with transfer out of bed and ambulation.  

## 2016-06-28 NOTE — Progress Notes (Signed)
Subjective:  Patient ID: Nathan Kramer, male    DOB: January 26, 1936  Age: 81 y.o. MRN: [id number]  CC: Back Pain (lower) and Leg Pain (bilateral)   Service Provided on Last Visit: Med Refill  PROCEDURE:None  HPI Nathan Kramer presents for reevaluation last seen 1 month ago. He's taking his MS Contin as prescribed and this continues to give him good relief with his diffuse body pain primarily affecting the hips knees feet and legs. Based on his narcotic assessment sheet he is deriving good overall lifestyle improvement in function with the medication and has tolerated the overall reduction in dose limiting short acting opioids.  He has a long-standing history of low back pain followed by Dr. crisp and thoracic pain lower extremity pain that is been present for many years. It does not appear to be influenced by time of day but his worst VAS score is a 6 and best is a 4 and appears to be in pain throughout the day. This pain is aggravated by any type of activity or movement and prolonged standing or walking. Rest seems to help alleviate pain as does sleeping and medication management. He has been on MS Contin for a long time and this is well tolerated and keeps his pain under good control he reports today. He also uses occasional oxycodone 10 mg tablets broken in half for periodic breakthrough pain. Description of the pain is sharp shooting uncomfortable deep an dreadful in nature and present in the regions as described above. He has had previous neurologic evaluations MRIs CT scans and x-rays. Facet blocks and epidural injections have been helpful to yield some temporizing pain relief. Unfortunately despite conservative therapy he has required chronic opioid maintenance therapy for pain relief. The practitioner database report was reviewed by me today and was found to be acceptable.  History Nathan Kramer has a past medical history of Anemia; Aortic insufficiency; Aortic stenosis; Chronic diastolic CHF  (congestive heart failure) ([place]) (01/2016); Congestive heart failure (CHF) ([place]) (06/01/2016); Mitral regurgitation; Other chronic pain; Prostate cancer ([place]); Reflux esophagitis; Stroke [facility]); Unspecified essential hypertension; and Unspecified visual loss.   He has a past surgical history that includes back surgery and Colonoscopy.   His family history includes Cancer in his sister; Diabetes in his mother; Heart disease in his brother and father; Hypertension in his father and mother.He reports that he has quit smoking. His smoking use included Cigars. He has a 75.00 pack-year smoking history. He has never used smokeless tobacco. He reports that he does not drink alcohol or use drugs.  Results for orders placed in visit on 04/24/13  [facility] W/O Cm   Narrative * PRIOR REPORT IMPORTED FROM AN EXTERNAL SYSTEM *   CLINICAL DATA:  Low back and bilateral hip pain. History of prior  back surgery.   EXAM:  MRI LUMBAR SPINE WITHOUT CONTRAST   TECHNIQUE:  Multiplanar, multisequence MR imaging was performed. No intravenous  contrast was administered.   COMPARISON:  None.   FINDINGS:  Advanced degenerative lumbar spondylosis with multilevel disc  disease and facet disease. The vertebral bodies demonstrate normal  marrow signal except for endplate reactive changes. A few small  scattered hemangiomas are also noted. The last full intervertebral  disc space is labeled L5-S1 and the conus medullaris terminates at  T12-L1. The facets are normally aligned. Advanced facet disease but  no definite pars defects.   No significant paraspinal or retroperitoneal process is identified.  There are multiple bilateral renal cysts  noted. The the spleen may  be enlarged. Ultrasound may be helpful for exact measurements.   L1-2: Mild annular bulge. No significant spinal or foraminal  stenosis. Mild lateral recess encroachment bilaterally.   L2-3: Advanced degenerative disc disease with marked disc  space  narrowing and osteophytic spurring. There is also moderate facet  disease. There is moderate bilateral lateral recess stenosis and  mild bilateral foraminal stenosis. No significant spinal stenosis.   L3-4: Diffuse bulging annulus, osteophytic ridging and advanced  facet disease contributing to moderate to moderately severe  bilateral lateral recess stenosis and mild right foraminal stenosis.  Early spinal stenosis.   L4-5: Broad-based bulging and slightly uncovered disc with  osteophytic spurring, short pedicles and advanced facet disease  contributing to mild spinal stenosis and moderate to moderately  severe bilateral lateral recess stenosis and moderate bilateral  foraminal stenosis.   L5-S1: Advanced facet disease and bulging annulus with early spinal  stenosis, moderate bilateral lateral recess stenosis and mild  bilateral foraminal stenosis.   IMPRESSION:  Advanced degenerative lumbar spondylosis with multilevel disc  disease and facet disease. There is multilevel multifactorial  spinal, lateral recess and foraminal stenosis as discussed above at  the individual levels.    Electronically Signed    By: Kalman Jewels M.D.    On: 04/24/2013 10:24        ToxAssure Select 13  Date Value Ref Range Status  04/18/2016 FINAL  Final    Comment:    ==================================================================== TOXASSURE SELECT 13 (MW) ==================================================================== Test                             Result       Flag       Units Drug Present and Declared for Prescription Verification   Morphine                       >18841       EXPECTED   ng/mg creat    Potential sources of large amounts of morphine in the absence of    codeine include administration of morphine or use of heroin.   Hydrocodone                    1736         EXPECTED   ng/mg creat   Hydromorphone                  427          EXPECTED   ng/mg creat    Dihydrocodeine                 174          EXPECTED   ng/mg creat   Norhydrocodone                 1005         EXPECTED   ng/mg creat    Sources of hydrocodone include scheduled prescription    medications. Hydromorphone, dihydrocodeine and norhydrocodone are    expected metabolites of hydrocodone. Hydromorphone and    dihydrocodeine are also available as scheduled prescription    medications. Drug Present not Declared for Prescription Verification   Methamphetamine                82           UNEXPECTED ng/mg creat    Sources of methamphetamine  include illicit sources, as a    scheduled prescription medication, as a metabolite of some    prescription drugs, or use of an l-methamphetamine inhaler. ==================================================================== Test                      Result    Flag   Units      Ref Range   Creatinine              78               mg/dL      >=20 ==================================================================== Declared Medications:  The flagging and interpretation on this report are based on the  following declared medications.  Unexpected results may arise from  inaccuracies in the declared medications.  **Note: The testing scope of this panel includes these medications:  Hydrocodone (Norco)  Morphine (MS Contin)  **Note: The testing scope of this panel does not include following  reported medications:  Acetaminophen (Norco)  Diphenhydramine (Benadryl)  Furosemide (Lasix)  Omeprazole (Nexium)  Potassium (Klor-Con) ==================================================================== For clinical consultation, please call [phone]. ====================================================================     Outpatient Medications Prior to Visit  Medication Sig Dispense Refill  . diphenhydrAMINE (BENADRYL) 25 MG tablet Take 25 mg by mouth as needed.     [name] esomeprazole (NEXIUM) 10 MG packet Take 20 mg by mouth daily before breakfast.     . Ferrous Gluconate (IRON 27 PO) Take 1 tablet by mouth daily.    [name] levothyroxine (SYNTHROID, LEVOTHROID) 25 MCG tablet Take 25 mcg by mouth daily before breakfast.    . metolazone (ZAROXOLYN) 2.5 MG tablet Take 2.5 mg by mouth every 7 (seven) days.    . Polyethylene Glycol 3350 (CLEARLAX PO) Take 1 packet by mouth daily.    [name] torsemide (DEMADEX) 20 MG tablet Take 2 tablets (40 mg total) by mouth daily. 60 tablet 3  . morphine (MS CONTIN) 30 MG 12 hr tablet Take 1 tablet (30 mg total) by mouth 3 (three) times daily with meals. 21 tablet 0  . KLOR-CON M20 20 MEQ tablet TAKE 1 TABLET (20 MEQ TOTAL) BY MOUTH DAILY. (Patient not taking: Reported on 06/27/2016) 90 tablet 3   No facility-administered medications prior to visit.    Lab Results  Component Value Date   WBC 33.7 (H) 06/13/2016   HGB 12.3 (L) 06/13/2016   HCT 41.0 06/13/2016   PLT 400 06/13/2016   GLUCOSE 119 (H) 06/15/2016   ALT 11 (L) 06/03/2016   AST 42 (H) 06/03/2016   NA 138 06/15/2016   K 4.7 06/15/2016   CL 92 (L) 06/15/2016   CREATININE 1.97 (H) 06/15/2016   BUN 87 (H) 06/15/2016   CO2 35 (H) 06/15/2016    --------------------------------------------------------------------------------------------------------------------- Ct Abdomen Pelvis Wo Contrast  Result Date: 01/11/2016 CLINICAL DATA:  81 year old male with a history of lower extremity edema. EXAM: CT ABDOMEN AND PELVIS WITHOUT CONTRAST TECHNIQUE: Multidetector CT imaging of the abdomen and pelvis was performed following the standard protocol without IV contrast. Patient is position on his right side for comfort COMPARISON:  MR lumbar spine 04/24/2013, CT chest 10/29/2015 FINDINGS: Lower chest: Calcifications/stents within coronary arteries. Trace pericardial fluid/thickening, unchanged from comparison CT. Calcifications of the aortic valve. Sub solid nodule of the left lower lobe again demonstrated, not significantly changed in size or configuration from the  comparison. Right middle lobe sub solid nodule also visualized. Similar appearance of linear scarring/atelectasis at the lung bases. Early bronchiectasis. Trace pleural fluid bilaterally. Hepatobiliary:  Unremarkable appearance of liver. Unremarkable appearance of the gallbladder. No intrahepatic or extrahepatic biliary ductal dilatation. Pancreas: Atrophic pancreatic parenchyma. No significant calcifications. Spleen: Greatest diameter of the spleen measures over 17 cm. Adrenals/Urinary Tract: Unremarkable appearance of the adrenal glands. Right: Nonobstructive stone within the right collecting system measuring 7 mm. Stone or a vascular calcification at the inferior right renal cortex measuring 10 mm. No right-sided hydronephrosis. Multiple rounded hypodense lesions of the right kidney cortex, some which are too small to characterize. Left: Small nonobstructive stones versus vascular calcifications at the inferior left collecting system measuring 3 mm - 4 mm. No left hydronephrosis. Multiple rounded low-density cystic lesions on the left. Largest on the anterior lateral cortex measures 5 cm. The smallest are too small to characterize by CT. Stomach/Bowel: Unremarkable appearance of stomach. Inflammatory changes and adjacent to the proximal duodenum. Probable duodenum diverticulum. No abnormally distended small bowel. Normal appendix. No evidence of colonic obstruction. Colonic diverticular disease. No associated inflammatory changes. Mesenteric: Free fluid layered over the dome of the liver, extending into the hilum of the liver and into the right pericolic gutter. Note that the patient is position on is right for this CT. Fluid layered dependently within the anatomic pelvis. Vascular/Lymphatic: Hazy infiltration of the small bowel mesenteric. Small lymph nodes present. No significantly enlarged lymph nodes. Scattered calcifications of the abdominal aorta.  No aneurysm. Reproductive: Calcifications of the prostate.  Other: Small fact containing umbilical hernia. Musculoskeletal: No displaced fracture. Multilevel degenerative changes of the thoracolumbar spine. Bilateral facet disease of the lower lumbar levels. No significant degenerative changes of the hips. IMPRESSION: Compared to CT of the chest 10/21/2015, there has been interval development of small volume free fluid within the upper abdomen and infiltration of the mesentery. The etiology of the free fluid is uncertain, and can be related to generalized edema/ anasarca and a positive fluid balance, although reactive process within the abdomen could also contribute to development of free fluid. If there is concern for acute intra-abdominal process, contrast-enhanced CT may be useful. Cardiomegaly and small bilateral pleural effusions. Splenomegaly. Aortic atherosclerosis. Coronary artery disease. Aortic valve calcifications. Bilateral renal cysts, which are most likely benign though not fully characterize on this noncontrast study. Bilateral nonobstructive nephrolithiasis. Diverticular disease without evidence of acute diverticulitis. Sub solid nodules of the left lower lobe and right middle lobe. As was noted on prior chest CT, a follow-up chest CT between 3 and 6 months (after 10/29/2015) is recommended. Signed, Dulcy Fanny. Earleen Newport, DO [facility] Electronically Signed   By: Corrie Mckusick D.O.   On: 01/11/2016 14:20   Dg Chest 2 View  Result Date: 01/12/2016 CLINICAL DATA:  Acute CHF, anasarca, CKD, moderate aortic stenosis, HTN, SOB, hx reflux esophagitis, stroke, cardiac murmurs, smoker, no prev surg EXAM: CHEST  2 VIEW COMPARISON:  01/11/2016 FINDINGS: Cardiac silhouette is mildly enlarged. There is a small hiatal hernia. No mediastinal or hilar masses or convincing adenopathy. There is elevation right hemidiaphragm. Linear opacities noted at the right lung base consistent with scarring or atelectasis. Minor  scar or atelectasis is noted at the left lateral lung base. There is a focal area of pleural thickening along the right lateral mid hemi thorax. Remainder of the lungs is clear. No convincing pleural effusion. No pneumothorax. Skeletal structures are demineralized but grossly intact. IMPRESSION: No acute cardiopulmonary disease. No change from the prior radiographs. Electronically Signed   By: Lajean Manes M.D.   On: 01/12/2016 09:28  Dg Chest 2 View  Result Date: 01/11/2016 CLINICAL DATA:  Shortness of breath EXAM: CHEST  2 VIEW COMPARISON:  10/29/2015 FINDINGS: Volume loss and pleural thickening on the right. There is scarring in the bases as noted previously. There is no edema, consolidation, effusion, or pneumothorax. Chronic cardiomegaly. Aortic tortuosity. IMPRESSION: 1. No acute finding. 2. Nodular opacity in the left lower lobe by July 2017 chest CT has not been visible by radiography. 3. Chronic right pleural thickening and volume loss. Mild basilar scarring. Electronically Signed   By: Monte Fantasia M.D.   On: 01/11/2016 11:29       ---------------------------------------------------------------------------------------------------------------------- Past Medical History:  Diagnosis Date  . Anemia   . Aortic insufficiency   . Aortic stenosis   . Chronic diastolic CHF (congestive heart failure) ([place]) 01/2016  . Congestive heart failure (CHF) ([place]) 06/01/2016   hospitalization 06/01/16 - 0315/18  . Mitral regurgitation   . Other chronic pain   . Prostate cancer ([place])   . Reflux esophagitis   . Stroke ([place])   . Unspecified essential hypertension   . Unspecified visual loss     Past Surgical History:  Procedure Laterality Date  . back surgery    . COLONOSCOPY      Family History  Problem Relation Age of Onset  . Diabetes Mother   . Hypertension Mother   . Hypertension Father   . Heart disease Father   . Cancer Sister   . Heart disease Brother     Social History   Substance Use Topics  . Smoking status: Former Smoker    Packs/day: 1.50    Years: 50.00    Types: Cigars  . Smokeless tobacco: Never Used  . Alcohol use No    ---------------------------------------------------------------------------------------------------------------------- Social History   Social History  . Marital status: Married    Spouse name: N/A  . Number of children: N/A  . Years of education: N/A   Occupational History  . retired    Social History Main Topics  . Smoking status: Former Smoker    Packs/day: 1.50    Years: 50.00    Types: Cigars  . Smokeless tobacco: Never Used  . Alcohol use No  . Drug use: No  . Sexual activity: Not Asked   Other Topics Concern  . None   Social History Narrative  . None    Scheduled Meds: Continuous Infusions: PRN Meds:.   BP (!) 94/47 (BP Location: Right Arm, Patient Position: Sitting, Cuff Size: Normal)   Pulse 86   Temp 97.7 F (36.5 C) (Oral)   Resp 16   Ht 5\' 8"  (1.727 m)   Wt 170 lb (77.1 kg)   SpO2 97% Comment: 2 liters nasal cannula  BMI 25.85 kg/m    BP Readings from Last 3 Encounters:  06/27/16 (!) 94/47  06/22/16 98/60  06/22/16 104/64     Wt Readings from Last 3 Encounters:  06/27/16 170 lb (77.1 kg)  06/22/16 165 lb (74.8 kg)  06/22/16 165 lb (74.8 kg)     ----------------------------------------------------------------------------------------------------------------------  ROS Review of Systems  The full review of systems was reviewed in detail. GI: Rare mild constipation  Objective:  BP (!) 94/47 (BP Location: Right Arm, Patient Position: Sitting, Cuff Size: Normal)   Pulse 86   Temp 97.7 F (36.5 C) (Oral)   Resp 16   Ht 5\' 8"  (1.727 m)   Wt 170 lb (77.1 kg)   SpO2 97% Comment: 2 liters nasal cannula  BMI 25.85 kg/m  Physical Exam Pupils are equally round reactive to light Extraocular muscles are intact Heart is regular rate and rhythm with a 3/6 systolic ejection  murmur No changes on his lower extremity strength and function are noted l     Assessment & Plan:   Kemuel was seen today for back pain and leg pain.  Diagnoses and all orders for this visit:  Primary osteoarthritis of both knees  Lumbosacral facet joint syndrome  Spinal stenosis, lumbar region, with neurogenic claudication  Chronic bilateral low back pain with bilateral sciatica  Other orders -     morphine (MS CONTIN) 30 MG 12 hr tablet; Take 1 tablet (30 mg total) by mouth 3 (three) times daily with meals.     ----------------------------------------------------------------------------------------------------------------------  Problem List Items Addressed This Visit      Musculoskeletal and Integument   DJD (degenerative joint disease) of knee - Primary   Relevant Medications   morphine (MS CONTIN) 30 MG 12 hr tablet     Other   Lumbosacral facet joint syndrome   Relevant Medications   morphine (MS CONTIN) 30 MG 12 hr tablet   Spinal stenosis, lumbar region, with neurogenic claudication    Other Visit Diagnoses    Chronic bilateral low back pain with bilateral sciatica       Relevant Medications   morphine (MS CONTIN) 30 MG 12 hr tablet      ----------------------------------------------------------------------------------------------------------------------  1. DDD (degenerative disc disease), cervical I will refill his medication for MS Contin 30 mg tablets 1 3 times a day with return to clinic in 1 month for reevaluation. We may consider a lumbar epidural steroid within the next 2-3 months to see if this can help with some mild exacerbation in his low back pain and lower extremity pain that he speaks up today. He has responded favorably to these in the past in regards to his spinal stenosis.  2. DDD (degenerative disc disease), lumbar  Return to clinic in 1 month for reevaluation.   3. Primary osteoarthritis of both knees  4. Lumbosacral facet joint  syndrome  -  5. Spinal stenosis, lumbar region, with neurogenic claudication  -  6. Chronic bilateral low back pain with bilateral sciatica    ----------------------------------------------------------------------------------------------------------------------  I am having Mr. Decou maintain his esomeprazole, diphenhydrAMINE, KLOR-CON M20, Ferrous Gluconate (IRON 27 PO), Polyethylene Glycol 3350 (CLEARLAX PO), levothyroxine, metolazone, torsemide, and morphine.   Meds ordered this encounter  Medications  . morphine (MS CONTIN) 30 MG 12 hr tablet    Sig: Take 1 tablet (30 mg total) by mouth 3 (three) times daily with meals.    Dispense:  90 tablet    Refill:  0       Follow-up: Return in about 1 month (around 07/28/2016) for evaluation, med refill.    Molli Barrows, MD   The [place] practitioner database for opioid medications on this patient has been reviewed by me and my staff   Greater than 50% of the total encounter time was spent in counseling and / or coordination of care.     This dictation was performed utilizing Systems analyst.  Please excuse any unintentional or mistaken typographical errors as a result.

## 2016-06-29 ENCOUNTER — Ambulatory Visit: Payer: Medicare Other

## 2016-06-29 DIAGNOSIS — R339 Retention of urine, unspecified: Secondary | ICD-10-CM

## 2016-06-29 NOTE — Progress Notes (Signed)
Pt presented today for voiding trial. Pt and wife both were very reluctant to have voiding trial due to penis still be very enlarged and swollen. Pt and wife requested to keep catheter another week to see if swelling will go down. Therefore made pt another nurse visit in a week.

## 2016-07-04 ENCOUNTER — Ambulatory Visit: Payer: Medicare Other | Admitting: Family

## 2016-07-05 ENCOUNTER — Ambulatory Visit: Payer: Medicare Other

## 2016-07-12 ENCOUNTER — Ambulatory Visit (INDEPENDENT_AMBULATORY_CARE_PROVIDER_SITE_OTHER): Payer: Medicare Other

## 2016-07-12 VITALS — BP 102/56 | HR 92 | Ht 68.0 in | Wt 166.3 lb

## 2016-07-12 DIAGNOSIS — R339 Retention of urine, unspecified: Secondary | ICD-10-CM

## 2016-07-12 NOTE — Progress Notes (Signed)
Pt presented today for a voiding trial. Pt and wife both stated that they are uncomfortable having foley removed at this point. Pt penis continues to swell. Wife stated pt has an appt with cardiology and nephrology in the next week. Wife stated she would prefer pt see both physicians prior to removing foley. Reinforced with pt and wife that is reasonable. Wife will call [facility] back once pt has seen both physicians, then an appt with [facility] physician will be scheduled.   Blood pressure (!) 102/56, pulse 92, height 5\' 8"  (1.727 m), weight 166 lb 4.8 oz (75.4 kg).

## 2016-07-19 ENCOUNTER — Ambulatory Visit: Payer: Medicare Other | Attending: Anesthesiology | Admitting: Anesthesiology

## 2016-07-19 ENCOUNTER — Encounter: Payer: Self-pay | Admitting: Anesthesiology

## 2016-07-19 VITALS — BP 93/47 | HR 89 | Temp 98.0°F | Resp 18 | Ht 68.0 in | Wt 150.0 lb

## 2016-07-19 DIAGNOSIS — Z79899 Other long term (current) drug therapy: Secondary | ICD-10-CM | POA: Insufficient documentation

## 2016-07-19 DIAGNOSIS — Z8673 Personal history of transient ischemic attack (TIA), and cerebral infarction without residual deficits: Secondary | ICD-10-CM | POA: Diagnosis not present

## 2016-07-19 DIAGNOSIS — Z833 Family history of diabetes mellitus: Secondary | ICD-10-CM | POA: Insufficient documentation

## 2016-07-19 DIAGNOSIS — Z79891 Long term (current) use of opiate analgesic: Secondary | ICD-10-CM | POA: Diagnosis not present

## 2016-07-19 DIAGNOSIS — M488X7 Other specified spondylopathies, lumbosacral region: Secondary | ICD-10-CM | POA: Diagnosis not present

## 2016-07-19 DIAGNOSIS — I110 Hypertensive heart disease with heart failure: Secondary | ICD-10-CM | POA: Diagnosis not present

## 2016-07-19 DIAGNOSIS — Z8249 Family history of ischemic heart disease and other diseases of the circulatory system: Secondary | ICD-10-CM | POA: Diagnosis not present

## 2016-07-19 DIAGNOSIS — M5441 Lumbago with sciatica, right side: Secondary | ICD-10-CM | POA: Diagnosis not present

## 2016-07-19 DIAGNOSIS — M5442 Lumbago with sciatica, left side: Secondary | ICD-10-CM | POA: Diagnosis not present

## 2016-07-19 DIAGNOSIS — H547 Unspecified visual loss: Secondary | ICD-10-CM | POA: Insufficient documentation

## 2016-07-19 DIAGNOSIS — D649 Anemia, unspecified: Secondary | ICD-10-CM | POA: Diagnosis not present

## 2016-07-19 DIAGNOSIS — M545 Low back pain: Secondary | ICD-10-CM | POA: Diagnosis present

## 2016-07-19 DIAGNOSIS — G8929 Other chronic pain: Secondary | ICD-10-CM | POA: Diagnosis not present

## 2016-07-19 DIAGNOSIS — M4697 Unspecified inflammatory spondylopathy, lumbosacral region: Secondary | ICD-10-CM | POA: Diagnosis not present

## 2016-07-19 DIAGNOSIS — M5030 Other cervical disc degeneration, unspecified cervical region: Secondary | ICD-10-CM | POA: Diagnosis not present

## 2016-07-19 DIAGNOSIS — I5032 Chronic diastolic (congestive) heart failure: Secondary | ICD-10-CM | POA: Insufficient documentation

## 2016-07-19 DIAGNOSIS — M47817 Spondylosis without myelopathy or radiculopathy, lumbosacral region: Secondary | ICD-10-CM

## 2016-07-19 DIAGNOSIS — Z87891 Personal history of nicotine dependence: Secondary | ICD-10-CM | POA: Diagnosis not present

## 2016-07-19 DIAGNOSIS — I080 Rheumatic disorders of both mitral and aortic valves: Secondary | ICD-10-CM | POA: Diagnosis not present

## 2016-07-19 DIAGNOSIS — Z8546 Personal history of malignant neoplasm of prostate: Secondary | ICD-10-CM | POA: Insufficient documentation

## 2016-07-19 DIAGNOSIS — M5136 Other intervertebral disc degeneration, lumbar region: Secondary | ICD-10-CM | POA: Diagnosis not present

## 2016-07-19 DIAGNOSIS — M170 Bilateral primary osteoarthritis of knee: Secondary | ICD-10-CM | POA: Diagnosis not present

## 2016-07-19 DIAGNOSIS — M48062 Spinal stenosis, lumbar region with neurogenic claudication: Secondary | ICD-10-CM | POA: Diagnosis not present

## 2016-07-19 MED ORDER — MORPHINE SULFATE ER 30 MG PO TBCR: 30.0000 mg | EXTENDED_RELEASE_TABLET | Freq: Three times a day (TID) | ORAL | 0 refills | Status: DC

## 2016-07-19 NOTE — Progress Notes (Signed)
Nursing Pain Medication Assessment:  Safety precautions to be maintained throughout the outpatient stay will include: orient to surroundings, keep bed in low position, maintain call bell within reach at all times, provide assistance with transfer out of bed and ambulation.  Medication Inspection Compliance: Pill count conducted under aseptic conditions, in front of the patient. Neither the pills nor the bottle was removed from the patient's sight at any time. Once count was completed pills were immediately returned to the patient in their original bottle.  Medication: Morphine ER (MSContin) Pill/Patch Count: 25 of 90 pills remain Pill/Patch Appearance: Markings consistent with prescribed medication Bottle Appearance: Standard pharmacy container. Clearly labeled. Filled Date: 03 / 27 / 2018 Last Medication intake:  Today

## 2016-07-19 NOTE — Progress Notes (Signed)
Subjective:  Patient ID: Nathan Kramer, male    DOB: 1935/06/22  Age: 81 y.o. MRN: [id number]  CC: Back Pain (low) and Leg Pain (bilateral)   Service Provided on Last Visit: Med Refill, Evaluation  PROCEDURE:None  HPI [name] presents for reevaluation. He was seen a month ago and is taking his MS Contin as prescribed and doing well with this regimen. He has more frequent breakthrough pain now that he has discontinued his oxycodone however the MS Contin seems to work well for him overall. He is denying any side effects and based on his narcotic assessment sheet he seems to be doing well with this regimen. The quality of his pain is stable as is the distribution. He primarily has troubles with his back hips and legs affecting mainly the knees. Bowel and bladder function have been stable and he is currently under care management with his primary team for this. Otherwise he is in his usual state of health and maintains that he has been compliant with his medications. No diverting or illicit use is noted.  He has a long-standing history of low back pain followed by Dr. crisp and thoracic pain lower extremity pain that is been present for many years. It does not appear to be influenced by time of day but his worst VAS score is a 6 and best is a 4 and appears to be in pain throughout the day. This pain is aggravated by any type of activity or movement and prolonged standing or walking. Rest seems to help alleviate pain as does sleeping and medication management. He has been on MS Contin for a long time and this is well tolerated and keeps his pain under good control he reports today. He also uses occasional oxycodone 10 mg tablets broken in half for periodic breakthrough pain. Description of the pain is sharp shooting uncomfortable deep an dreadful in nature and present in the regions as described above. He has had previous neurologic evaluations MRIs CT scans and x-rays. Facet blocks and  epidural injections have been helpful to yield some temporizing pain relief. Unfortunately despite conservative therapy he has required chronic opioid maintenance therapy for pain relief. The practitioner database report was reviewed by me today and was found to be acceptable.  History Nathan Kramer has a past medical history of Anemia; Aortic insufficiency; Aortic stenosis; Chronic diastolic CHF (congestive heart failure) ([place]) (01/2016); Congestive heart failure (CHF) ([place]) (06/01/2016); Mitral regurgitation; Other chronic pain; Prostate cancer ([place]); Reflux esophagitis; Stroke [facility]); Unspecified essential hypertension; and Unspecified visual loss.   He has a past surgical history that includes back surgery and Colonoscopy.   His family history includes Cancer in his sister; Diabetes in his mother; Heart disease in his brother and father; Hypertension in his father and mother.He reports that he has quit smoking. His smoking use included Cigars. He has a 75.00 pack-year smoking history. He has never used smokeless tobacco. He reports that he does not drink alcohol or use drugs.  Results for orders placed in visit on 04/24/13  [facility] W/O Cm   Narrative * PRIOR REPORT IMPORTED FROM AN EXTERNAL SYSTEM *   CLINICAL DATA:  Low back and bilateral hip pain. History of prior  back surgery.   EXAM:  MRI LUMBAR SPINE WITHOUT CONTRAST   TECHNIQUE:  Multiplanar, multisequence MR imaging was performed. No intravenous  contrast was administered.   COMPARISON:  None.   FINDINGS:  Advanced degenerative lumbar spondylosis with multilevel disc  disease and facet disease. The vertebral bodies demonstrate normal  marrow signal except for endplate reactive changes. A few small  scattered hemangiomas are also noted. The last full intervertebral  disc space is labeled L5-S1 and the conus medullaris terminates at  T12-L1. The facets are normally aligned. Advanced facet disease but  no definite pars  defects.   No significant paraspinal or retroperitoneal process is identified.  There are multiple bilateral renal cysts noted. The the spleen may  be enlarged. Ultrasound may be helpful for exact measurements.   L1-2: Mild annular bulge. No significant spinal or foraminal  stenosis. Mild lateral recess encroachment bilaterally.   L2-3: Advanced degenerative disc disease with marked disc space  narrowing and osteophytic spurring. There is also moderate facet  disease. There is moderate bilateral lateral recess stenosis and  mild bilateral foraminal stenosis. No significant spinal stenosis.   L3-4: Diffuse bulging annulus, osteophytic ridging and advanced  facet disease contributing to moderate to moderately severe  bilateral lateral recess stenosis and mild right foraminal stenosis.  Early spinal stenosis.   L4-5: Broad-based bulging and slightly uncovered disc with  osteophytic spurring, short pedicles and advanced facet disease  contributing to mild spinal stenosis and moderate to moderately  severe bilateral lateral recess stenosis and moderate bilateral  foraminal stenosis.   L5-S1: Advanced facet disease and bulging annulus with early spinal  stenosis, moderate bilateral lateral recess stenosis and mild  bilateral foraminal stenosis.   IMPRESSION:  Advanced degenerative lumbar spondylosis with multilevel disc  disease and facet disease. There is multilevel multifactorial  spinal, lateral recess and foraminal stenosis as discussed above at  the individual levels.    Electronically Signed    By: Kalman Jewels M.D.    On: 04/24/2013 10:24        ToxAssure Select 13  Date Value Ref Range Status  04/18/2016 FINAL  Final    Comment:    ==================================================================== TOXASSURE SELECT 13 (MW) ==================================================================== Test                             Result       Flag       Units Drug  Present and Declared for Prescription Verification   Morphine                       >[id number]       EXPECTED   ng/mg creat    Potential sources of large amounts of morphine in the absence of    codeine include administration of morphine or use of heroin.   Hydrocodone                    1736         EXPECTED   ng/mg creat   Hydromorphone                  427          EXPECTED   ng/mg creat   Dihydrocodeine                 174          EXPECTED   ng/mg creat   Norhydrocodone                 1005         EXPECTED   ng/mg creat    Sources of hydrocodone include scheduled prescription  medications. Hydromorphone, dihydrocodeine and norhydrocodone are    expected metabolites of hydrocodone. Hydromorphone and    dihydrocodeine are also available as scheduled prescription    medications. Drug Present not Declared for Prescription Verification   Methamphetamine                82           UNEXPECTED ng/mg creat    Sources of methamphetamine include illicit sources, as a    scheduled prescription medication, as a metabolite of some    prescription drugs, or use of an l-methamphetamine inhaler. ==================================================================== Test                      Result    Flag   Units      Ref Range   Creatinine              78               mg/dL      >=20 ==================================================================== Declared Medications:  The flagging and interpretation on this report are based on the  following declared medications.  Unexpected results may arise from  inaccuracies in the declared medications.  **Note: The testing scope of this panel includes these medications:  Hydrocodone (Norco)  Morphine (MS Contin)  **Note: The testing scope of this panel does not include following  reported medications:  Acetaminophen (Norco)  Diphenhydramine (Benadryl)  Furosemide (Lasix)  Omeprazole (Nexium)  Potassium  (Klor-Con) ==================================================================== For clinical consultation, please call [phone]. ====================================================================     Outpatient Medications Prior to Visit  Medication Sig Dispense Refill  . diphenhydrAMINE (BENADRYL) 25 MG tablet Take 25 mg by mouth as needed.     [name] esomeprazole (NEXIUM) 10 MG packet Take 20 mg by mouth daily before breakfast.    . Ferrous Gluconate (IRON 27 PO) Take 1 tablet by mouth daily.    [name] levothyroxine (SYNTHROID, LEVOTHROID) 25 MCG tablet Take 25 mcg by mouth daily before breakfast.    . metolazone (ZAROXOLYN) 2.5 MG tablet Take 2.5 mg by mouth every 7 (seven) days.    . Polyethylene Glycol 3350 (CLEARLAX PO) Take 1 packet by mouth daily.    [name] torsemide (DEMADEX) 20 MG tablet Take 2 tablets (40 mg total) by mouth daily. 60 tablet 3  . morphine (MS CONTIN) 30 MG 12 hr tablet Take 1 tablet (30 mg total) by mouth 3 (three) times daily with meals. 90 tablet 0  . KLOR-CON M20 20 MEQ tablet TAKE 1 TABLET (20 MEQ TOTAL) BY MOUTH DAILY. (Patient not taking: Reported on 07/19/2016) 90 tablet 3   No facility-administered medications prior to visit.    Lab Results  Component Value Date   WBC 33.7 (H) 06/13/2016   HGB 12.3 (L) 06/13/2016   HCT 41.0 06/13/2016   PLT 400 06/13/2016   GLUCOSE 119 (H) 06/15/2016   ALT 11 (L) 06/03/2016   AST 42 (H) 06/03/2016   NA 138 06/15/2016   K 4.7 06/15/2016   CL 92 (L) 06/15/2016   CREATININE 1.97 (H) 06/15/2016   BUN 87 (H) 06/15/2016   CO2 35 (H) 06/15/2016    --------------------------------------------------------------------------------------------------------------------- Ct Abdomen Pelvis Wo Contrast  Result Date: 01/11/2016 CLINICAL DATA:  81 year old male with a history of lower extremity edema. EXAM: CT ABDOMEN AND PELVIS WITHOUT CONTRAST TECHNIQUE: Multidetector CT imaging of the abdomen and pelvis was performed following the  standard protocol without IV contrast. Patient is position on his right side  for comfort COMPARISON:  MR lumbar spine 04/24/2013, CT chest 10/29/2015 FINDINGS: Lower chest: Calcifications/stents within coronary arteries. Trace pericardial fluid/thickening, unchanged from comparison CT. Calcifications of the aortic valve. Sub solid nodule of the left lower lobe again demonstrated, not significantly changed in size or configuration from the comparison. Right middle lobe sub solid nodule also visualized. Similar appearance of linear scarring/atelectasis at the lung bases. Early bronchiectasis. Trace pleural fluid bilaterally. Hepatobiliary: Unremarkable appearance of liver. Unremarkable appearance of the gallbladder. No intrahepatic or extrahepatic biliary ductal dilatation. Pancreas: Atrophic pancreatic parenchyma. No significant calcifications. Spleen: Greatest diameter of the spleen measures over 17 cm. Adrenals/Urinary Tract: Unremarkable appearance of the adrenal glands. Right: Nonobstructive stone within the right collecting system measuring 7 mm. Stone or a vascular calcification at the inferior right renal cortex measuring 10 mm. No right-sided hydronephrosis. Multiple rounded hypodense lesions of the right kidney cortex, some which are too small to characterize. Left: Small nonobstructive stones versus vascular calcifications at the inferior left collecting system measuring 3 mm - 4 mm. No left hydronephrosis. Multiple rounded low-density cystic lesions on the left. Largest on the anterior lateral cortex measures 5 cm. The smallest are too small to characterize by CT. Stomach/Bowel: Unremarkable appearance of stomach. Inflammatory changes and adjacent to the proximal duodenum. Probable duodenum diverticulum. No abnormally distended small bowel. Normal appendix. No evidence of colonic obstruction. Colonic diverticular disease. No associated inflammatory changes. Mesenteric: Free fluid layered over the dome of  the liver, extending into the hilum of the liver and into the right pericolic gutter. Note that the patient is position on is right for this CT. Fluid layered dependently within the anatomic pelvis. Vascular/Lymphatic: Hazy infiltration of the small bowel mesenteric. Small lymph nodes present. No significantly enlarged lymph nodes. Scattered calcifications of the abdominal aorta.  No aneurysm. Reproductive: Calcifications of the prostate. Other: Small fact containing umbilical hernia. Musculoskeletal: No displaced fracture. Multilevel degenerative changes of the thoracolumbar spine. Bilateral facet disease of the lower lumbar levels. No significant degenerative changes of the hips. IMPRESSION: Compared to CT of the chest 10/21/2015, there has been interval development of small volume free fluid within the upper abdomen and infiltration of the mesentery. The etiology of the free fluid is uncertain, and can be related to generalized edema/ anasarca and a positive fluid balance, although reactive process within the abdomen could also contribute to development of free fluid. If there is concern for acute intra-abdominal process, contrast-enhanced CT may be useful. Cardiomegaly and small bilateral pleural effusions. Splenomegaly. Aortic atherosclerosis. Coronary artery disease. Aortic valve calcifications. Bilateral renal cysts, which are most likely benign though not fully characterize on this noncontrast study. Bilateral nonobstructive nephrolithiasis. Diverticular disease without evidence of acute diverticulitis. Sub solid nodules of the left lower lobe and right middle lobe. As was noted on prior chest CT, a follow-up chest CT between 3 and 6 months (after 10/29/2015) is recommended. Signed, Dulcy Fanny. Earleen Newport, DO [facility] Electronically Signed   By: Corrie Mckusick D.O.   On: 01/11/2016 14:20   Dg Chest 2 View  Result Date: 01/12/2016 CLINICAL DATA:   Acute CHF, anasarca, CKD, moderate aortic stenosis, HTN, SOB, hx reflux esophagitis, stroke, cardiac murmurs, smoker, no prev surg EXAM: CHEST  2 VIEW COMPARISON:  01/11/2016 FINDINGS: Cardiac silhouette is mildly enlarged. There is a small hiatal hernia. No mediastinal or hilar masses or convincing adenopathy. There is elevation right hemidiaphragm. Linear opacities noted at the right lung base consistent with scarring or  atelectasis. Minor scar or atelectasis is noted at the left lateral lung base. There is a focal area of pleural thickening along the right lateral mid hemi thorax. Remainder of the lungs is clear. No convincing pleural effusion. No pneumothorax. Skeletal structures are demineralized but grossly intact. IMPRESSION: No acute cardiopulmonary disease. No change from the prior radiographs. Electronically Signed   By: Lajean Manes M.D.   On: 01/12/2016 09:28   Dg Chest 2 View  Result Date: 01/11/2016 CLINICAL DATA:  Shortness of breath EXAM: CHEST  2 VIEW COMPARISON:  10/29/2015 FINDINGS: Volume loss and pleural thickening on the right. There is scarring in the bases as noted previously. There is no edema, consolidation, effusion, or pneumothorax. Chronic cardiomegaly. Aortic tortuosity. IMPRESSION: 1. No acute finding. 2. Nodular opacity in the left lower lobe by July 2017 chest CT has not been visible by radiography. 3. Chronic right pleural thickening and volume loss. Mild basilar scarring. Electronically Signed   By: Monte Fantasia M.D.   On: 01/11/2016 11:29       ---------------------------------------------------------------------------------------------------------------------- Past Medical History:  Diagnosis Date  . Anemia   . Aortic insufficiency   . Aortic stenosis   . Chronic diastolic CHF (congestive heart failure) ([place]) 01/2016  . Congestive heart failure (CHF) ([place]) 06/01/2016   hospitalization 06/01/16 - 0315/18  . Mitral regurgitation   . Other chronic pain   .  Prostate cancer ([place])   . Reflux esophagitis   . Stroke ([place])   . Unspecified essential hypertension   . Unspecified visual loss     Past Surgical History:  Procedure Laterality Date  . back surgery    . COLONOSCOPY      Family History  Problem Relation Age of Onset  . Diabetes Mother   . Hypertension Mother   . Hypertension Father   . Heart disease Father   . Cancer Sister   . Heart disease Brother     Social History  Substance Use Topics  . Smoking status: Former Smoker    Packs/day: 1.50    Years: 50.00    Types: Cigars  . Smokeless tobacco: Never Used  . Alcohol use No    ---------------------------------------------------------------------------------------------------------------------- Social History   Social History  . Marital status: Married    Spouse name: N/A  . Number of children: N/A  . Years of education: N/A   Occupational History  . retired    Social History Main Topics  . Smoking status: Former Smoker    Packs/day: 1.50    Years: 50.00    Types: Cigars  . Smokeless tobacco: Never Used  . Alcohol use No  . Drug use: No  . Sexual activity: Not Asked   Other Topics Concern  . None   Social History Narrative  . None    Scheduled Meds: Continuous Infusions: PRN Meds:.   BP (!) 93/47   Pulse 89   Temp 98 F (36.7 C) (Oral)   Resp 18   Ht 5\' 8"  (1.727 m)   Wt 150 lb (68 kg)   SpO2 98%   BMI 22.81 kg/m    BP Readings from Last 3 Encounters:  07/19/16 (!) 93/47  07/12/16 (!) 102/56  06/27/16 (!) 94/47     Wt Readings from Last 3 Encounters:  07/19/16 150 lb (68 kg)  07/12/16 166 lb 4.8 oz (75.4 kg)  06/27/16 170 lb (77.1 kg)     ----------------------------------------------------------------------------------------------------------------------  ROS Review of Systems  The full review of systems was reviewed in  detail. GI: Rare mild constipation  Objective:  BP (!) 93/47   Pulse 89   Temp 98 F (36.7 C)  (Oral)   Resp 18   Ht 5\' 8"  (1.727 m)   Wt 150 lb (68 kg)   SpO2 98%   BMI 22.81 kg/m   Physical Exam Pupils are equally round reactive to light Extraocular muscles are intact Heart is regular rate and rhythm with a 3/6 systolic ejection murmur No changes on his lower extremity strength and function are noted l     Assessment & Plan:   Eriberto was seen today for back pain and leg pain.  Diagnoses and all orders for this visit:  Primary osteoarthritis of both knees  Lumbosacral facet joint syndrome (HCC)  Chronic bilateral low back pain with bilateral sciatica  Spinal stenosis, lumbar region, with neurogenic claudication  Other orders -     morphine (MS CONTIN) 30 MG 12 hr tablet; Take 1 tablet (30 mg total) by mouth 3 (three) times daily with meals.     ----------------------------------------------------------------------------------------------------------------------  Problem List Items Addressed This Visit      Musculoskeletal and Integument   DJD (degenerative joint disease) of knee - Primary   Relevant Medications   morphine (MS CONTIN) 30 MG 12 hr tablet     Other   Lumbosacral facet joint syndrome (HCC)   Relevant Medications   morphine (MS CONTIN) 30 MG 12 hr tablet   Spinal stenosis, lumbar region, with neurogenic claudication    Other Visit Diagnoses    Chronic bilateral low back pain with bilateral sciatica       Relevant Medications   morphine (MS CONTIN) 30 MG 12 hr tablet      ----------------------------------------------------------------------------------------------------------------------  1. DDD (degenerative disc disease), cervical I he seems to be doing well with this regimen. We will continue with her present course and refill for the next month with return to clinic in 1 month. He is to follow-up with his primary care physicians regarding his recent bout with an exacerbation of congestive heart failure. We have also reviewed an  ocular practitioner database and it is appropriate.  2. DDD (degenerative disc disease), lumbar  Return to clinic in 1 month for reevaluation.   3. Primary osteoarthritis of both knees  4. Lumbosacral facet joint syndrome  -  5. Spinal stenosis, lumbar region, with neurogenic claudication  -  6. Chronic bilateral low back pain with bilateral sciatica    ----------------------------------------------------------------------------------------------------------------------  I have discontinued Mr. Betters's KLOR-CON M20. I am also having him maintain his esomeprazole, diphenhydrAMINE, Ferrous Gluconate (IRON 27 PO), Polyethylene Glycol 3350 (CLEARLAX PO), levothyroxine, metolazone, torsemide, and morphine.   Meds ordered this encounter  Medications  . morphine (MS CONTIN) 30 MG 12 hr tablet    Sig: Take 1 tablet (30 mg total) by mouth 3 (three) times daily with meals.    Dispense:  90 tablet    Refill:  0    Do not fill until [id number]       Follow-up: Return in about 1 month (around 08/18/2016) for evaluation, med refill.    Molli Barrows, MD   The [place] practitioner database for opioid medications on this patient has been reviewed by me and my staff   Greater than 50% of the total encounter time was spent in counseling and / or coordination of care.     This dictation was performed utilizing Systems analyst.  Please excuse any unintentional or mistaken typographical errors as  a result.

## 2016-07-20 ENCOUNTER — Encounter: Payer: Self-pay | Admitting: Cardiology

## 2016-07-20 ENCOUNTER — Ambulatory Visit (INDEPENDENT_AMBULATORY_CARE_PROVIDER_SITE_OTHER): Payer: Medicare Other | Admitting: Cardiology

## 2016-07-20 VITALS — BP 94/56 | HR 89 | Ht 68.0 in | Wt 158.5 lb

## 2016-07-20 DIAGNOSIS — I5033 Acute on chronic diastolic (congestive) heart failure: Secondary | ICD-10-CM

## 2016-07-20 DIAGNOSIS — I351 Nonrheumatic aortic (valve) insufficiency: Secondary | ICD-10-CM | POA: Diagnosis not present

## 2016-07-20 DIAGNOSIS — I5032 Chronic diastolic (congestive) heart failure: Secondary | ICD-10-CM | POA: Diagnosis not present

## 2016-07-20 DIAGNOSIS — I350 Nonrheumatic aortic (valve) stenosis: Secondary | ICD-10-CM | POA: Diagnosis not present

## 2016-07-20 NOTE — Progress Notes (Signed)
Cardiology Office Note   Date:  07/20/2016   ID:  Nathan Kramer, DOB May 09, 1935, MRN [id number]  Referring Doctor:  Tracie Harrier, MD   Cardiologist:   Wende Bushy, MD   Reason for consultation:  Chief Complaint  Patient presents with  . other    4 week f/u. Pt c/o cramps in right hand anytime of the day.       History of Present Illness: Nathan Kramer is a 81 y.o. male who presents for Follow-up for CHF   Since last visit, patient reports that his weight has been pretty stable. He continues to have leg swelling but it has slowly gotten better. No chest pain. Shortness of breath is about the same as before, NYHA class IIII.  ROS:  Please see the history of present illness. Aside from mentioned under HPI, all other systems are reviewed and negative.    Past Medical History:  Diagnosis Date  . Anemia   . Aortic insufficiency   . Aortic stenosis   . Chronic diastolic CHF (congestive heart failure) ([place]) 01/2016  . Congestive heart failure (CHF) ([place]) 06/01/2016   hospitalization 06/01/16 - 0315/18  . Mitral regurgitation   . Other chronic pain   . Prostate cancer ([place])   . Reflux esophagitis   . Stroke ([place])   . Unspecified essential hypertension   . Unspecified visual loss     Past Surgical History:  Procedure Laterality Date  . back surgery    . COLONOSCOPY       reports that he has quit smoking. His smoking use included Cigars. He has a 75.00 pack-year smoking history. He has never used smokeless tobacco. He reports that he does not drink alcohol or use drugs.   family history includes Cancer in his sister; Diabetes in his mother; Heart disease in his brother and father; Hypertension in his father and mother.   Current Outpatient Prescriptions  Medication Sig Dispense Refill  . diphenhydrAMINE (BENADRYL) 25 MG tablet Take 25 mg by mouth as needed.     [name] esomeprazole (NEXIUM) 10 MG packet Take 20 mg by mouth daily before breakfast.    . Ferrous  Gluconate (IRON 27 PO) Take 1 tablet by mouth daily.    [name] levothyroxine (SYNTHROID, LEVOTHROID) 25 MCG tablet Take 25 mcg by mouth daily before breakfast.    . metolazone (ZAROXOLYN) 2.5 MG tablet Take 2.5 mg two tablets weekly.    [name] morphine (MS CONTIN) 30 MG 12 hr tablet Take 1 tablet (30 mg total) by mouth 3 (three) times daily with meals. 90 tablet 0  . Polyethylene Glycol 3350 (CLEARLAX PO) Take 1 packet by mouth daily.    [name] torsemide (DEMADEX) 20 MG tablet Take 2 tablets (40 mg total) by mouth daily. 60 tablet 3   No current facility-administered medications for this visit.     Allergies: Amoxicillin and Reglan [metoclopramide]    PHYSICAL EXAM: VS:  BP (!) 94/56 (BP Location: Left Arm, Patient Position: Sitting, Cuff Size: Normal)   Pulse 89   Ht 5\' 8"  (1.727 m)   Wt 158 lb 8 oz (71.9 kg)   BMI 24.10 kg/m  , Body mass index is 24.1 kg/m. Wt Readings from Last 3 Encounters:  07/20/16 158 lb 8 oz (71.9 kg)  07/19/16 150 lb (68 kg)  07/12/16 166 lb 4.8 oz (75.4 kg)  GENERAL:  well developed, well nourished, obese, not in acute distress HEENT: normocephalic, pink conjunctivae, anicteric sclerae, no xanthelasma, normal dentition,  oropharynx clear NECK:  no neck vein engorgement, JVP normal, no hepatojugular reflux, carotid upstroke brisk and symmetric, no bruit, no thyromegaly, no lymphadenopathy LUNGS:  good respiratory effort, diffuse coarse crackles CV:  PMI not displaced, no thrills, no lifts, S1 and S2 within normal limits, no palpable S3 or S4, no murmurs, no rubs, no gallops ABD:  Soft, nontender, nondistended, normoactive bowel sounds, no abdominal aortic bruit, no hepatomegaly, no splenomegaly MS: nontender back, no kyphosis, no scoliosis, no joint deformities EXT:  2+ DP/PT pulses,++ edema/wrapped in bandage, no varicosities, no cyanosis, no clubbing SKIN: warm, nondiaphoretic, normal turgor, no ulcers NEUROPSYCH: alert, oriented to person, place, and time, sensory/motor  grossly intact, normal mood, appropriate affect  Recent Labs: 05/31/2016: B Natriuretic Peptide 1,557.0 06/03/2016: ALT 11 06/13/2016: Hemoglobin 12.3; Platelets 400 06/15/2016: BUN 87; Creatinine, Ser 1.97; Potassium 4.7; Sodium 138   Lipid Panel No results found for: CHOL, TRIG, HDL, CHOLHDL, VLDL, LDLCALC, LDLDIRECT   Other studies Reviewed:  EKG:  The ekg from 08/24/2015 was personally reviewed by me and it revealed sinus rhythm, 81 BPM. T wave inversions. Poor R-wave progression.  EKG from 12/27/2015 was personally reviewed by me and showed normal sinus rhythm, 79 BPM. T wave inversion. QT 400 ms, QTC 433 ms, within normal limits.  EKG from 05/30/2016 was personally reviewed by me and showed sinus rhythm, 88 BPM. Nonspecific ST-T wave changes. QT 320 ms, QTC 369 ms, within normal limits.  Additional studies/ records that were reviewed personally reviewed by me today include:  Echo 09/07/2015: Left ventricle: The cavity size was normal. There was mild  concentric hypertrophy. Systolic function was normal. The  estimated ejection fraction was in the range of 55% to 60%. Wall  motion was normal; there were no regional wall motion  abnormalities. Features are consistent with a pseudonormal left  ventricular filling pattern, with concomitant abnormal relaxation  and increased filling pressure (grade 2 diastolic dysfunction). - Aortic valve: There was moderate to severe stenosis. There was  moderate regurgitation. Mean gradient (S): 25 mm Hg. Valve area  (VTI): 0.84 cm^2. Valve area (Vmax): 0.81 cm^2. - Aorta: Ascending aortic diameter: 37 mm (S). - Mitral valve: Calcified annulus. Mildly thickened, moderately  calcified leaflets . The findings are consistent with mild  stenosis. Valve area by continuity equation (using LVOT flow):  1.84 cm^2. - Left atrium: The atrium was mildly dilated. - Pulmonary arteries: Systolic pressure was moderately increased.  PA peak pressure:  55 mm Hg (S).  Aortic valve Value Reference Aortic valve peak velocity, S 313 cm/s --------- Aortic valve mean velocity, S 237 cm/s --------- Aortic valve VTI, S 72.2 cm --------- Aortic mean gradient, S 25 mm Hg --------- Aortic peak gradient, S 39 mm Hg --------- VTI ratio, LVOT/AV 0.31 --------- Aortic valve area, VTI 0.84 cm^2 --------- Aortic valve area/bsa, VTI 0.45 cm^2/m^2 --------- Velocity ratio, peak, LVOT/AV 0.32 --------- Aortic valve area, peak velocity 0.81 cm^2 --------- Aortic valve area/bsa, peak 0.43 cm^2/m^2 --------- velocity Velocity ratio, mean, LVOT/AV 0.28 --------- Aortic valve area, mean velocity 0.71 cm^2 --------- Aortic valve area/bsa, mean 0.38 cm^2/m^2 --------- velocity Aortic regurg pressure half-time 302 ms ---------  Nuclear stress is 10/04/2015: Pharmacological myocardial perfusion imaging study with no significant  ischemia GI uptake artifact noted Normal wall motion, EF estimated at 50% (ejection fraction likely reduce secondary to artifact around the inferior wall) No EKG changes concerning for ischemia at peak stress or in recovery. Nonspecific ST abnormality noted at rest Low risk scan  Labs  through PCP 05/11/2016: BUN 34, creatinine 1.9, potassium 4.7  Echo 06/01/2016: Study Conclusions  - Left ventricle: The cavity size was normal. There was mild   concentric hypertrophy. Systolic function was normal. The   estimated ejection fraction was in the range of 50% to 55%. Wall   motion was normal; there were no regional wall motion   abnormalities. Features are consistent with a  pseudonormal left   ventricular filling pattern, with concomitant abnormal relaxation   and increased filling pressure (grade 2 diastolic dysfunction). - Ventricular septum: The contour showed systolic flattening. These   changes are consistent with RV pressure overload. - Aortic valve: There was moderate to severe stenosis. There was   moderate regurgitation. Mean gradient (S): 22 mm Hg. Valve area   (VTI): 0.91 cm^2. - Mitral valve: Calcified annulus. Mildly thickened, mildly   calcified leaflets . There was mild regurgitation. - Left atrium: The atrium was mildly dilated. - Right atrium: The atrium was mildly dilated. - Pulmonary arteries: Systolic pressure was moderately increased.   PA peak pressure: 55 mm Hg (S).   ASSESSMENT AND PLAN: CHF, preserved ejection fraction,  Chronic Underlying chronic kidney disease Recommendations the same as from last visit 06/22/2016: He is currently on torsemide 40 mg daily. With potassium replacement. Continue same doses. May reduce metolazone to 2.5 mg once a week. Check BMP today. Continue current medical therapy. Continue daily weights and low sodium diet.  If weight gain of > 2 lbs over 24 hours, or > 5 lbs over 1 week, please call office. Recommend chest x-ray for lung findings noted on physical examination.   Aortic stenosis Aortic insufficiency Recommendations the same as from last visit 06/22/2016: Most recent echo reveals moderate to severe  as. Significant multiple medical comorbidities. Unlikely to be a good candidate for surgery We will continue to reevaluate.  Blood pressures have been borderline. He is only using diuretic at this point. Unable to use any ACE inhibitor, Aldactone, beta blocker.   Current medicines are reviewed at length with the patient today.  The patient does not have concerns regarding medicines.  Labs/ tests ordered today include:  Orders Placed This Encounter  Procedures  . DG Chest 2 View  . Basic  metabolic panel   Disposition:   FU with Cardiology In 2 months  Signed, Wende Bushy, MD  07/20/2016 2:55 PM    [place]

## 2016-07-20 NOTE — Patient Instructions (Signed)
Labwork: We will call you with lab results from today.   Testing/Procedures: Chest xray. Please go to [facility] to check in at the front desk and have chest xray done.   Follow-Up: Your physician recommends that you schedule a follow-up appointment in: 2 months.   It was a pleasure seeing you today here in the office. Please do not hesitate to give [place] a call back if you have any further questions. [place], BSN

## 2016-07-21 LAB — BASIC METABOLIC PANEL
BUN / CREAT RATIO: 29 — AB (ref 10–24)
BUN: 56 mg/dL — ABNORMAL HIGH (ref 8–27)
CO2: 27 mmol/L (ref 18–29)
CREATININE: 1.94 mg/dL — AB (ref 0.76–1.27)
Calcium: 8.6 mg/dL (ref 8.6–10.2)
Chloride: 91 mmol/L — ABNORMAL LOW (ref 96–106)
GFR calc Af Amer: 37 mL/min/{1.73_m2} — ABNORMAL LOW (ref >59)
GFR calc non Af Amer: 32 mL/min/{1.73_m2} — ABNORMAL LOW (ref >59)
GLUCOSE: 90 mg/dL (ref 65–99)
Potassium: 4.7 mmol/L (ref 3.5–5.2)
SODIUM: 140 mmol/L (ref 134–144)

## 2016-07-25 ENCOUNTER — Ambulatory Visit
Admission: RE | Admit: 2016-07-25 | Discharge: 2016-07-25 | Disposition: A | Discharge status: Home / Self Care | Payer: Medicare Other | Source: Ambulatory Visit | Attending: Cardiology | Admitting: Cardiology

## 2016-07-25 DIAGNOSIS — R918 Other nonspecific abnormal finding of lung field: Secondary | ICD-10-CM | POA: Diagnosis present

## 2016-07-25 DIAGNOSIS — I5032 Chronic diastolic (congestive) heart failure: Secondary | ICD-10-CM | POA: Insufficient documentation

## 2016-07-26 ENCOUNTER — Telehealth: Payer: Self-pay | Admitting: *Deleted

## 2016-07-26 DIAGNOSIS — I5033 Acute on chronic diastolic (congestive) heart failure: Secondary | ICD-10-CM

## 2016-07-26 NOTE — Telephone Encounter (Signed)
-----   Message from Wende Bushy, MD sent at 07/25/2016 10:42 AM EDT ----- Stable labs. K ok. rec rpt in 6 weeks

## 2016-07-26 NOTE — Telephone Encounter (Signed)
Reviewed lab results and recommendations with patients wife per release form. Lab order entered and will mail lab slip. She verbalized understanding of all results and had no further questions at this time. Let her know that I would route xray results to his PCP.

## 2016-07-27 ENCOUNTER — Ambulatory Visit (INDEPENDENT_AMBULATORY_CARE_PROVIDER_SITE_OTHER): Payer: Medicare Other | Admitting: Urology

## 2016-07-27 ENCOUNTER — Encounter: Payer: Self-pay | Admitting: Urology

## 2016-07-27 VITALS — BP 86/47 | HR 93 | Ht 68.0 in | Wt 158.5 lb

## 2016-07-27 DIAGNOSIS — R339 Retention of urine, unspecified: Secondary | ICD-10-CM | POA: Diagnosis not present

## 2016-07-27 DIAGNOSIS — Z8546 Personal history of malignant neoplasm of prostate: Secondary | ICD-10-CM

## 2016-07-27 NOTE — Progress Notes (Signed)
07/27/2016 4:01 PM   Nathan Porteous 11/22/1935 [id number]  Referring provider: Tracie Harrier, MD [address] [facility] [place], [place] [address]  Chief Complaint  Patient presents with  . Urinary Retention    HPI: The patient is a 81 year old gentleman presents today for evaluation of urinary retention. He has failed one trial of void at this point. He is not currently on any medications for BPH.  1. Urinary retention Patient was admitted to the hospital on 06/10/2016 for CHF exacerbation. A Foley catheter was placed at that time at which point he developed gross hematuria. Eventually it CBI for traumatic Foley versus hemorrhagic cystitis. He has failed his trial of void one time. His Foley was placed solely because he is bedridden for his CHF exacerbation. He denied previous urinary symptoms. He felt that he emptied his bladder with a good stream. He no hesitancy or urgency. He's never taken medications for BPH.  2. History of prostate cancer The patient had Gleason 8 prostate cancer and underwent external beam radiation in 2011 with 3 years of concurrent androgen deprivation therapy. Last PSA was in July 2017 and was 0.4.   PMH: Past Medical History:  Diagnosis Date  . Anemia   . Aortic insufficiency   . Aortic stenosis   . Chronic diastolic CHF (congestive heart failure) ([place]) 01/2016  . Congestive heart failure (CHF) ([place]) 06/01/2016   hospitalization 06/01/16 - 0315/18  . Mitral regurgitation   . Other chronic pain   . Prostate cancer ([place])   . Reflux esophagitis   . Stroke ([place])   . Unspecified essential hypertension   . Unspecified visual loss     Surgical History: Past Surgical History:  Procedure Laterality Date  . back surgery    . COLONOSCOPY      Home Medications:  Allergies as of 07/27/2016      Reactions   Amoxicillin    Other reaction(s): Localized superficial swelling of skin Face swelling    Reglan [metoclopramide]    Involuntary movements body      Medication List       Accurate as of 07/27/16  4:01 PM. Always use your most recent med list.          CLEARLAX PO Take 1 packet by mouth daily.   diphenhydrAMINE 25 MG tablet Commonly known as:  BENADRYL Take 25 mg by mouth as needed.   esomeprazole 10 MG packet Commonly known as:  NEXIUM Take 20 mg by mouth daily before breakfast.   IRON 27 PO Take 1 tablet by mouth daily.   levothyroxine 25 MCG tablet Commonly known as:  SYNTHROID, LEVOTHROID Take 25 mcg by mouth daily before breakfast.   metolazone 2.5 MG tablet Commonly known as:  ZAROXOLYN Take 2.5 mg two tablets weekly.   morphine 30 MG 12 hr tablet Commonly known as:  MS CONTIN Take 1 tablet (30 mg total) by mouth 3 (three) times daily with meals.   torsemide 20 MG tablet Commonly known as:  DEMADEX Take 2 tablets (40 mg total) by mouth daily.       Allergies:  Allergies  Allergen Reactions  . Amoxicillin     Other reaction(s): Localized superficial swelling of skin Face swelling   . Reglan [Metoclopramide]     Involuntary movements body    Family History: Family History  Problem Relation Age of Onset  . Diabetes Mother   . Hypertension Mother   . Hypertension Father   . Heart disease Father   .  Cancer Sister   . Heart disease Brother     Social History:  reports that he has quit smoking. His smoking use included Cigars. He has a 75.00 pack-year smoking history. He has never used smokeless tobacco. He reports that he does not drink alcohol or use drugs.  ROS: UROLOGY Frequent Urination?: No Hard to postpone urination?: No Burning/pain with urination?: No Get up at night to urinate?: No Leakage of urine?: No Urine stream starts and stops?: No Trouble starting stream?: No Do you have to strain to urinate?: No Blood in urine?: No Urinary tract infection?: No Sexually transmitted disease?: No Injury to kidneys or bladder?: No Painful intercourse?:  No Weak stream?: No Erection problems?: No Penile pain?: No  Gastrointestinal Nausea?: No Vomiting?: No Indigestion/heartburn?: No Diarrhea?: No Constipation?: No  Constitutional Fever: No Night sweats?: No Weight loss?: No Fatigue?: No  Skin Skin rash/lesions?: No Itching?: No  Eyes Blurred vision?: No Double vision?: No  Ears/Nose/Throat Sore throat?: No Sinus problems?: Yes  Hematologic/Lymphatic Swollen glands?: No Easy bruising?: No  Cardiovascular Leg swelling?: No Chest pain?: No  Respiratory Cough?: No Shortness of breath?: Yes  Endocrine Excessive thirst?: No  Musculoskeletal Back pain?: Yes Joint pain?: Yes  Neurological Headaches?: No Dizziness?: Yes  Psychologic Depression?: No Anxiety?: No  Physical Exam: BP (!) 86/47 (BP Location: Left Arm, Patient Position: Sitting, Cuff Size: Normal)   Pulse 93   Ht 5\' 8"  (1.727 m)   Wt 158 lb 8 oz (71.9 kg)   BMI 24.10 kg/m   Constitutional:  Alert and oriented, No acute distress. HEENT:  AT, moist mucus membranes.  Trachea midline, no masses. Cardiovascular: No clubbing, cyanosis, or edema. Respiratory: Normal respiratory effort, no increased work of breathing. GI: Abdomen is soft, nontender, nondistended, no abdominal masses GU: No CVA tenderness. Foley in place. Clear yellow urine. Skin: No rashes, bruises or suspicious lesions. Lymph: No cervical or inguinal adenopathy. Neurologic: Grossly intact, no focal deficits, moving all 4 extremities. Psychiatric: Normal mood and affect.  Laboratory Data: Lab Results  Component Value Date   WBC 33.7 (H) 06/13/2016   HGB 12.3 (L) 06/13/2016   HCT 41.0 06/13/2016   MCV 64.8 (L) 06/13/2016   PLT 400 06/13/2016    Lab Results  Component Value Date   CREATININE 1.94 (H) 07/20/2016    No results found for: PSA  No results found for: TESTOSTERONE  No results found for: HGBA1C  Urinalysis    Component Value Date/Time   COLORURINE  YELLOW (A) 06/02/2016 1614   APPEARANCEUR CLEAR (A) 06/02/2016 1614   LABSPEC 1.011 06/02/2016 1614   PHURINE 5.0 06/02/2016 1614   GLUCOSEU NEGATIVE 06/02/2016 1614   HGBUR MODERATE (A) 06/02/2016 1614   BILIRUBINUR NEGATIVE 06/02/2016 1614   KETONESUR NEGATIVE 06/02/2016 1614   PROTEINUR (A) 06/09/2016 2330    TEST NOT REPORTED DUE TO COLOR INTERFERENCE OF URINE PIGMENT   NITRITE (A) 06/09/2016 2330    TEST NOT REPORTED DUE TO COLOR INTERFERENCE OF URINE PIGMENT   LEUKOCYTESUR (A) 06/09/2016 2330    TEST NOT REPORTED DUE TO COLOR INTERFERENCE OF URINE PIGMENT    Assessment & Plan:   1. Urinary retention I think there is a good chance the patient will be able to void as the catheter was placed in the first place for being bedridden and not urinary difficulty. Since his the end of the day, we'll have him come back tomorrow for a nurse visit for a trial of void so that if  he fails it he does not end up in the emergency room tonight. If he does not pass this trial of void, we will need to start Flomax.  2. History of prostate cancer Due for repeat PSA in July 2018. We'll hold off on ordering now due to current Foley catheter.  Nickie Retort, MD  [facility] [address] [place], [place] [address] [phone]

## 2016-07-28 ENCOUNTER — Ambulatory Visit (INDEPENDENT_AMBULATORY_CARE_PROVIDER_SITE_OTHER): Payer: Medicare Other

## 2016-07-28 VITALS — BP 110/62 | HR 94 | Ht 68.0 in | Wt 159.0 lb

## 2016-07-28 DIAGNOSIS — R339 Retention of urine, unspecified: Secondary | ICD-10-CM | POA: Diagnosis not present

## 2016-07-28 NOTE — Progress Notes (Signed)
Pt presented today for a voiding trial. Pt was not able to tolerated water being instilled into the bladder. Therefore 18fr cathter was removed and pt was instructed to RTC at 2:30 this afternoon. Pt and wife voiced understanding.  Blood pressure 110/62, pulse 94, height 5\' 8"  (1.727 m), weight 159 lb (72.1 kg).  Bladder Scan:240 Patient can void:  Performed By: Toniann Fail, LPN   Per Dr. Pilar Jarvis pt should f/u in 44mo with PSA prior. Pt voiced understanding and made appts at check out.

## 2016-08-08 ENCOUNTER — Telehealth: Payer: Self-pay | Admitting: Cardiology

## 2016-08-08 NOTE — Telephone Encounter (Signed)
Spoke with nurse who was present in patients home. She states that the patient continues to have shortness of breath and she reports crackles in his lungs. She states that he also has some pain in the left side of his abdomen which family reports is from turning the wrong way. He denies any chest pain at this time. Abdominal girth back on 07/15/16 was 100 cm and today it is 102 cm. Weight was 160 lbs yesterday and day before it was 158 lbs. She states that they did not do weight today. Let her know that I would check with Dr. Yvone Neu and then be in touch with them for any recommendations. She verbalized understanding with no further questions at this time.

## 2016-08-08 NOTE — Telephone Encounter (Signed)
Pt spouse called, states home health nurse states pt has had some "crackles in his lungs, and pain in his lleft abdomen, near his ribs. Denies any chest pain, just SOB. She states he has more fluid than normal.

## 2016-08-08 NOTE — Telephone Encounter (Signed)
Spoke with patients wife and reviewed Dr. Tora Kindred recommendations that he can take torsemide 3 tablets once daily for 2 days and then go back to 2 tablets once daily. Also reviewed the importance of daily weights and to call back if it should continue to increase. She verbalized understanding of all instructions and had no further questions at this time.

## 2016-08-08 NOTE — Telephone Encounter (Signed)
Patient can take an extra torsemide (20mg ) for a total of 60mg  qd x 2 days then resume usual dosing. Pls let us know if weight gain continues on.

## 2016-08-08 NOTE — Telephone Encounter (Signed)
Spoke with nurse and made her aware of Dr. Tora Kindred recommendations, she verbalized understanding with no further questions.

## 2016-08-16 ENCOUNTER — Encounter: Payer: Self-pay | Admitting: Anesthesiology

## 2016-08-16 ENCOUNTER — Ambulatory Visit: Payer: Medicare Other | Attending: Anesthesiology | Admitting: Anesthesiology

## 2016-08-16 VITALS — BP 87/49 | Temp 97.9°F | Resp 16 | Ht 68.0 in | Wt 159.0 lb

## 2016-08-16 DIAGNOSIS — D649 Anemia, unspecified: Secondary | ICD-10-CM | POA: Diagnosis not present

## 2016-08-16 DIAGNOSIS — M48062 Spinal stenosis, lumbar region with neurogenic claudication: Secondary | ICD-10-CM

## 2016-08-16 DIAGNOSIS — K210 Gastro-esophageal reflux disease with esophagitis: Secondary | ICD-10-CM | POA: Insufficient documentation

## 2016-08-16 DIAGNOSIS — M546 Pain in thoracic spine: Secondary | ICD-10-CM | POA: Insufficient documentation

## 2016-08-16 DIAGNOSIS — Z8673 Personal history of transient ischemic attack (TIA), and cerebral infarction without residual deficits: Secondary | ICD-10-CM | POA: Diagnosis not present

## 2016-08-16 DIAGNOSIS — M5442 Lumbago with sciatica, left side: Secondary | ICD-10-CM

## 2016-08-16 DIAGNOSIS — I352 Nonrheumatic aortic (valve) stenosis with insufficiency: Secondary | ICD-10-CM | POA: Diagnosis not present

## 2016-08-16 DIAGNOSIS — M5441 Lumbago with sciatica, right side: Secondary | ICD-10-CM | POA: Diagnosis not present

## 2016-08-16 DIAGNOSIS — I080 Rheumatic disorders of both mitral and aortic valves: Secondary | ICD-10-CM | POA: Insufficient documentation

## 2016-08-16 DIAGNOSIS — M170 Bilateral primary osteoarthritis of knee: Secondary | ICD-10-CM

## 2016-08-16 DIAGNOSIS — M5387 Other specified dorsopathies, lumbosacral region: Secondary | ICD-10-CM | POA: Insufficient documentation

## 2016-08-16 DIAGNOSIS — Z87891 Personal history of nicotine dependence: Secondary | ICD-10-CM | POA: Diagnosis not present

## 2016-08-16 DIAGNOSIS — I5032 Chronic diastolic (congestive) heart failure: Secondary | ICD-10-CM | POA: Insufficient documentation

## 2016-08-16 DIAGNOSIS — I130 Hypertensive heart and chronic kidney disease with heart failure and stage 1 through stage 4 chronic kidney disease, or unspecified chronic kidney disease: Secondary | ICD-10-CM | POA: Insufficient documentation

## 2016-08-16 DIAGNOSIS — M4697 Unspecified inflammatory spondylopathy, lumbosacral region: Secondary | ICD-10-CM | POA: Diagnosis not present

## 2016-08-16 DIAGNOSIS — Z8546 Personal history of malignant neoplasm of prostate: Secondary | ICD-10-CM | POA: Insufficient documentation

## 2016-08-16 DIAGNOSIS — M533 Sacrococcygeal disorders, not elsewhere classified: Secondary | ICD-10-CM | POA: Diagnosis not present

## 2016-08-16 DIAGNOSIS — Z8249 Family history of ischemic heart disease and other diseases of the circulatory system: Secondary | ICD-10-CM | POA: Insufficient documentation

## 2016-08-16 DIAGNOSIS — G8929 Other chronic pain: Secondary | ICD-10-CM | POA: Diagnosis not present

## 2016-08-16 DIAGNOSIS — N189 Chronic kidney disease, unspecified: Secondary | ICD-10-CM | POA: Diagnosis not present

## 2016-08-16 DIAGNOSIS — M545 Low back pain: Secondary | ICD-10-CM | POA: Diagnosis present

## 2016-08-16 DIAGNOSIS — M47817 Spondylosis without myelopathy or radiculopathy, lumbosacral region: Secondary | ICD-10-CM

## 2016-08-16 MED ORDER — MORPHINE SULFATE ER 30 MG PO TBCR: 30.0000 mg | EXTENDED_RELEASE_TABLET | Freq: Three times a day (TID) | ORAL | 0 refills | Status: AC

## 2016-08-16 MED ORDER — MORPHINE SULFATE ER 30 MG PO TBCR: 30.0000 mg | EXTENDED_RELEASE_TABLET | Freq: Three times a day (TID) | ORAL | 0 refills | Status: DC

## 2016-08-16 NOTE — Progress Notes (Signed)
Nursing Pain Medication Assessment:  Safety precautions to be maintained throughout the outpatient stay will include: orient to surroundings, keep bed in low position, maintain call bell within reach at all times, provide assistance with transfer out of bed and ambulation.  Medication Inspection Compliance: Mr. Karwowski did not comply with our request to bring his pills to be counted. He was reminded that bringing the medication bottles, even when empty, is a requirement.  Medication: None brought in. Pill/Patch Count: None available to be counted. Bottle Appearance: No container available. Did not bring bottle(s) to appointment. Filled Date: N/A Last Medication intake:  Today

## 2016-08-17 NOTE — Progress Notes (Signed)
Subjective:  Patient ID: Nathan Kramer, male    DOB: 1936-02-24  Age: 81 y.o. MRN: [id number]  CC: Back Pain (lower)   Service Provided on Last Visit: Med Refill  PROCEDURE:None  HPI Nathan Kramer presents for reevaluation last seen April 18. He's doing well with his current regimen of MS Contin at the 30 mg strength. We have worked to reduce his total dose exposure on several of his medications and this seems to be working well for him. Otherwise he's in his usual state of health and the quality characteristic distribution of his low back pain and diffuse body pain is otherwise unchanged. He is tolerating his medications well as we have reviewed his narcotic assessment sheet and derives general benefit from his medications.   He has a long-standing history of low back pain followed by Dr. crisp and thoracic pain lower extremity pain that is been present for many years. It does not appear to be influenced by time of day but his worst VAS score is a 6 and best is a 4 and appears to be in pain throughout the day. This pain is aggravated by any type of activity or movement and prolonged standing or walking. Rest seems to help alleviate pain as does sleeping and medication management. He has been on MS Contin for a long time and this is well tolerated and keeps his pain under good control he reports today. He also uses occasional oxycodone 10 mg tablets broken in half for periodic breakthrough pain. Description of the pain is sharp shooting uncomfortable deep an dreadful in nature and present in the regions as described above. He has had previous neurologic evaluations MRIs CT scans and x-rays. Facet blocks and epidural injections have been helpful to yield some temporizing pain relief. Unfortunately despite conservative therapy he has required chronic opioid maintenance therapy for pain relief. The practitioner database report was reviewed by me today and was found to be  acceptable.  History Nathan Kramer has a past medical history of Anemia; Aortic insufficiency; Aortic stenosis; Chronic diastolic CHF (congestive heart failure) ([place]) (01/2016); Congestive heart failure (CHF) ([place]) (06/01/2016); Mitral regurgitation; Other chronic pain; Prostate cancer ([place]); Reflux esophagitis; Stroke [facility]); Unspecified essential hypertension; and Unspecified visual loss.   He has a past surgical history that includes back surgery and Colonoscopy.   His family history includes Cancer in his sister; Diabetes in his mother; Heart disease in his brother and father; Hypertension in his father and mother.He reports that he has quit smoking. His smoking use included Cigars. He has a 75.00 pack-year smoking history. He has never used smokeless tobacco. He reports that he does not drink alcohol or use drugs.  Results for orders placed in visit on 04/24/13  [facility] W/O Cm   Narrative * PRIOR REPORT IMPORTED FROM AN EXTERNAL SYSTEM *   CLINICAL DATA:  Low back and bilateral hip pain. History of prior  back surgery.   EXAM:  MRI LUMBAR SPINE WITHOUT CONTRAST   TECHNIQUE:  Multiplanar, multisequence MR imaging was performed. No intravenous  contrast was administered.   COMPARISON:  None.   FINDINGS:  Advanced degenerative lumbar spondylosis with multilevel disc  disease and facet disease. The vertebral bodies demonstrate normal  marrow signal except for endplate reactive changes. A few small  scattered hemangiomas are also noted. The last full intervertebral  disc space is labeled L5-S1 and the conus medullaris terminates at  T12-L1. The facets are normally aligned. Advanced facet disease but  no definite pars defects.   No significant paraspinal or retroperitoneal process is identified.  There are multiple bilateral renal cysts noted. The the spleen may  be enlarged. Ultrasound may be helpful for exact measurements.   L1-2: Mild annular bulge. No significant spinal or  foraminal  stenosis. Mild lateral recess encroachment bilaterally.   L2-3: Advanced degenerative disc disease with marked disc space  narrowing and osteophytic spurring. There is also moderate facet  disease. There is moderate bilateral lateral recess stenosis and  mild bilateral foraminal stenosis. No significant spinal stenosis.   L3-4: Diffuse bulging annulus, osteophytic ridging and advanced  facet disease contributing to moderate to moderately severe  bilateral lateral recess stenosis and mild right foraminal stenosis.  Early spinal stenosis.   L4-5: Broad-based bulging and slightly uncovered disc with  osteophytic spurring, short pedicles and advanced facet disease  contributing to mild spinal stenosis and moderate to moderately  severe bilateral lateral recess stenosis and moderate bilateral  foraminal stenosis.   L5-S1: Advanced facet disease and bulging annulus with early spinal  stenosis, moderate bilateral lateral recess stenosis and mild  bilateral foraminal stenosis.   IMPRESSION:  Advanced degenerative lumbar spondylosis with multilevel disc  disease and facet disease. There is multilevel multifactorial  spinal, lateral recess and foraminal stenosis as discussed above at  the individual levels.    Electronically Signed    By: Kalman Jewels M.D.    On: 04/24/2013 10:24        ToxAssure Select 13  Date Value Ref Range Status  04/18/2016 FINAL  Final    Comment:    ==================================================================== TOXASSURE SELECT 13 (MW) ==================================================================== Test                             Result       Flag       Units Drug Present and Declared for Prescription Verification   Morphine                       >44010       EXPECTED   ng/mg creat    Potential sources of large amounts of morphine in the absence of    codeine include administration of morphine or use of heroin.   Hydrocodone                     1736         EXPECTED   ng/mg creat   Hydromorphone                  427          EXPECTED   ng/mg creat   Dihydrocodeine                 174          EXPECTED   ng/mg creat   Norhydrocodone                 1005         EXPECTED   ng/mg creat    Sources of hydrocodone include scheduled prescription    medications. Hydromorphone, dihydrocodeine and norhydrocodone are    expected metabolites of hydrocodone. Hydromorphone and    dihydrocodeine are also available as scheduled prescription    medications. Drug Present not Declared for Prescription Verification   Methamphetamine  82           UNEXPECTED ng/mg creat    Sources of methamphetamine include illicit sources, as a    scheduled prescription medication, as a metabolite of some    prescription drugs, or use of an l-methamphetamine inhaler. ==================================================================== Test                      Result    Flag   Units      Ref Range   Creatinine              78               mg/dL      >=20 ==================================================================== Declared Medications:  The flagging and interpretation on this report are based on the  following declared medications.  Unexpected results may arise from  inaccuracies in the declared medications.  **Note: The testing scope of this panel includes these medications:  Hydrocodone (Norco)  Morphine (MS Contin)  **Note: The testing scope of this panel does not include following  reported medications:  Acetaminophen (Norco)  Diphenhydramine (Benadryl)  Furosemide (Lasix)  Omeprazole (Nexium)  Potassium (Klor-Con) ==================================================================== For clinical consultation, please call [phone]. ====================================================================     Outpatient Medications Prior to Visit  Medication Sig Dispense Refill  . diphenhydrAMINE (BENADRYL) 25  MG tablet Take 25 mg by mouth as needed.     [name] esomeprazole (NEXIUM) 10 MG packet Take 20 mg by mouth daily before breakfast.    . Ferrous Gluconate (IRON 27 PO) Take 1 tablet by mouth daily.    [name] levothyroxine (SYNTHROID, LEVOTHROID) 25 MCG tablet Take 25 mcg by mouth daily before breakfast.    . metolazone (ZAROXOLYN) 2.5 MG tablet Take 2.5 mg two tablets weekly.    . Polyethylene Glycol 3350 (CLEARLAX PO) Take 1 packet by mouth daily.    [name] torsemide (DEMADEX) 20 MG tablet Take 2 tablets (40 mg total) by mouth daily. 60 tablet 3  . morphine (MS CONTIN) 30 MG 12 hr tablet Take 1 tablet (30 mg total) by mouth 3 (three) times daily with meals. 90 tablet 0   No facility-administered medications prior to visit.    Lab Results  Component Value Date   WBC 33.7 (H) 06/13/2016   HGB 12.3 (L) 06/13/2016   HCT 41.0 06/13/2016   PLT 400 06/13/2016   GLUCOSE 90 07/20/2016   ALT 11 (L) 06/03/2016   AST 42 (H) 06/03/2016   NA 140 07/20/2016   K 4.7 07/20/2016   CL 91 (L) 07/20/2016   CREATININE 1.94 (H) 07/20/2016   BUN 56 (H) 07/20/2016   CO2 27 07/20/2016    --------------------------------------------------------------------------------------------------------------------- Ct Abdomen Pelvis Wo Contrast  Result Date: 01/11/2016 CLINICAL DATA:  81 year old male with a history of lower extremity edema. EXAM: CT ABDOMEN AND PELVIS WITHOUT CONTRAST TECHNIQUE: Multidetector CT imaging of the abdomen and pelvis was performed following the standard protocol without IV contrast. Patient is position on his right side for comfort COMPARISON:  MR lumbar spine 04/24/2013, CT chest 10/29/2015 FINDINGS: Lower chest: Calcifications/stents within coronary arteries. Trace pericardial fluid/thickening, unchanged from comparison CT. Calcifications of the aortic valve. Sub solid nodule of the left lower lobe again demonstrated, not significantly changed in size or configuration from the comparison. Right middle  lobe sub solid nodule also visualized. Similar appearance of linear scarring/atelectasis at the lung bases. Early bronchiectasis. Trace pleural fluid bilaterally. Hepatobiliary: Unremarkable appearance of liver. Unremarkable appearance of the gallbladder. No  intrahepatic or extrahepatic biliary ductal dilatation. Pancreas: Atrophic pancreatic parenchyma. No significant calcifications. Spleen: Greatest diameter of the spleen measures over 17 cm. Adrenals/Urinary Tract: Unremarkable appearance of the adrenal glands. Right: Nonobstructive stone within the right collecting system measuring 7 mm. Stone or a vascular calcification at the inferior right renal cortex measuring 10 mm. No right-sided hydronephrosis. Multiple rounded hypodense lesions of the right kidney cortex, some which are too small to characterize. Left: Small nonobstructive stones versus vascular calcifications at the inferior left collecting system measuring 3 mm - 4 mm. No left hydronephrosis. Multiple rounded low-density cystic lesions on the left. Largest on the anterior lateral cortex measures 5 cm. The smallest are too small to characterize by CT. Stomach/Bowel: Unremarkable appearance of stomach. Inflammatory changes and adjacent to the proximal duodenum. Probable duodenum diverticulum. No abnormally distended small bowel. Normal appendix. No evidence of colonic obstruction. Colonic diverticular disease. No associated inflammatory changes. Mesenteric: Free fluid layered over the dome of the liver, extending into the hilum of the liver and into the right pericolic gutter. Note that the patient is position on is right for this CT. Fluid layered dependently within the anatomic pelvis. Vascular/Lymphatic: Hazy infiltration of the small bowel mesenteric. Small lymph nodes present. No significantly enlarged lymph nodes. Scattered calcifications of the abdominal aorta.  No aneurysm. Reproductive: Calcifications of the prostate. Other: Small fact  containing umbilical hernia. Musculoskeletal: No displaced fracture. Multilevel degenerative changes of the thoracolumbar spine. Bilateral facet disease of the lower lumbar levels. No significant degenerative changes of the hips. IMPRESSION: Compared to CT of the chest 10/21/2015, there has been interval development of small volume free fluid within the upper abdomen and infiltration of the mesentery. The etiology of the free fluid is uncertain, and can be related to generalized edema/ anasarca and a positive fluid balance, although reactive process within the abdomen could also contribute to development of free fluid. If there is concern for acute intra-abdominal process, contrast-enhanced CT may be useful. Cardiomegaly and small bilateral pleural effusions. Splenomegaly. Aortic atherosclerosis. Coronary artery disease. Aortic valve calcifications. Bilateral renal cysts, which are most likely benign though not fully characterize on this noncontrast study. Bilateral nonobstructive nephrolithiasis. Diverticular disease without evidence of acute diverticulitis. Sub solid nodules of the left lower lobe and right middle lobe. As was noted on prior chest CT, a follow-up chest CT between 3 and 6 months (after 10/29/2015) is recommended. Signed, Dulcy Fanny. Earleen Newport, DO [facility] Rad[facility] Electronically Signed   By: Corrie Mckusick D.O.   On: 01/11/2016 14:20   Dg Chest 2 View  Result Date: 01/12/2016 CLINICAL DATA:  Acute CHF, anasarca, CKD, moderate aortic stenosis, HTN, SOB, hx reflux esophagitis, stroke, cardiac murmurs, smoker, no prev surg EXAM: CHEST  2 VIEW COMPARISON:  01/11/2016 FINDINGS: Cardiac silhouette is mildly enlarged. There is a small hiatal hernia. No mediastinal or hilar masses or convincing adenopathy. There is elevation right hemidiaphragm. Linear opacities noted at the right lung base consistent with scarring or atelectasis. Minor scar or atelectasis  is noted at the left lateral lung base. There is a focal area of pleural thickening along the right lateral mid hemi thorax. Remainder of the lungs is clear. No convincing pleural effusion. No pneumothorax. Skeletal structures are demineralized but grossly intact. IMPRESSION: No acute cardiopulmonary disease. No change from the prior radiographs. Electronically Signed   By: Lajean Manes M.D.   On: 01/12/2016 09:28   Dg Chest 2 View  Result Date: 01/11/2016 CLINICAL DATA:  Shortness of breath EXAM: CHEST  2 VIEW COMPARISON:  10/29/2015 FINDINGS: Volume loss and pleural thickening on the right. There is scarring in the bases as noted previously. There is no edema, consolidation, effusion, or pneumothorax. Chronic cardiomegaly. Aortic tortuosity. IMPRESSION: 1. No acute finding. 2. Nodular opacity in the left lower lobe by July 2017 chest CT has not been visible by radiography. 3. Chronic right pleural thickening and volume loss. Mild basilar scarring. Electronically Signed   By: Monte Fantasia M.D.   On: 01/11/2016 11:29       ---------------------------------------------------------------------------------------------------------------------- Past Medical History:  Diagnosis Date  . Anemia   . Aortic insufficiency   . Aortic stenosis   . Chronic diastolic CHF (congestive heart failure) ([place]) 01/2016  . Congestive heart failure (CHF) ([place]) 06/01/2016   hospitalization 06/01/16 - 0315/18  . Mitral regurgitation   . Other chronic pain   . Prostate cancer ([place])   . Reflux esophagitis   . Stroke ([place])   . Unspecified essential hypertension   . Unspecified visual loss     Past Surgical History:  Procedure Laterality Date  . back surgery    . COLONOSCOPY      Family History  Problem Relation Age of Onset  . Diabetes Mother   . Hypertension Mother   . Hypertension Father   . Heart disease Father   . Cancer Sister   . Heart disease Brother     Social History  Substance Use  Topics  . Smoking status: Former Smoker    Packs/day: 1.50    Years: 50.00    Types: Cigars  . Smokeless tobacco: Never Used  . Alcohol use No    ---------------------------------------------------------------------------------------------------------------------- Social History   Social History  . Marital status: Married    Spouse name: N/A  . Number of children: N/A  . Years of education: N/A   Occupational History  . retired    Social History Main Topics  . Smoking status: Former Smoker    Packs/day: 1.50    Years: 50.00    Types: Cigars  . Smokeless tobacco: Never Used  . Alcohol use No  . Drug use: No  . Sexual activity: Not Asked   Other Topics Concern  . None   Social History Narrative  . None    Scheduled Meds: Continuous Infusions: PRN Meds:.   BP (!) 87/49   Temp 97.9 F (36.6 C) (Oral)   Resp 16   Ht 5\' 8"  (1.727 m)   Wt 159 lb (72.1 kg)   SpO2 96%   BMI 24.18 kg/m    BP Readings from Last 3 Encounters:  08/16/16 (!) 87/49  07/28/16 110/62  07/27/16 (!) 86/47     Wt Readings from Last 3 Encounters:  08/16/16 159 lb (72.1 kg)  07/28/16 159 lb (72.1 kg)  07/27/16 158 lb 8 oz (71.9 kg)     ----------------------------------------------------------------------------------------------------------------------  ROS Review of Systems  The full review of systems was reviewed in detail. GI: Rare mild constipation  Objective:  BP (!) 87/49   Temp 97.9 F (36.6 C) (Oral)   Resp 16   Ht 5\' 8"  (1.727 m)   Wt 159 lb (72.1 kg)   SpO2 96%   BMI 24.18 kg/m   Physical Exam Pupils are equally round reactive to light Extraocular muscles are intact Heart is regular rate and rhythm with a 3/6 systolic ejection murmur He has some mild paraspinous muscle tenderness in the low lumbar region bilaterally but strength appears at  baseline.   Assessment & Plan:   Nathan Kramer was seen today for back pain.  Diagnoses and all orders for this  visit:  Primary osteoarthritis of both knees  Lumbosacral facet joint syndrome (HCC)  Chronic bilateral low back pain with bilateral sciatica  Spinal stenosis, lumbar region, with neurogenic claudication  Sacroiliac joint dysfunction  Other orders -     Discontinue: morphine (MS CONTIN) 30 MG 12 hr tablet; Take 1 tablet (30 mg total) by mouth 3 (three) times daily with meals. -     morphine (MS CONTIN) 30 MG 12 hr tablet; Take 1 tablet (30 mg total) by mouth 3 (three) times daily with meals.     ----------------------------------------------------------------------------------------------------------------------  Problem List Items Addressed This Visit      Musculoskeletal and Integument   DJD (degenerative joint disease) of knee - Primary   Relevant Medications   morphine (MS CONTIN) 30 MG 12 hr tablet   Sacroiliac joint dysfunction     Other   Lumbosacral facet joint syndrome (HCC)   Relevant Medications   morphine (MS CONTIN) 30 MG 12 hr tablet   Spinal stenosis, lumbar region, with neurogenic claudication    Other Visit Diagnoses    Chronic bilateral low back pain with bilateral sciatica       Relevant Medications   morphine (MS CONTIN) 30 MG 12 hr tablet      ----------------------------------------------------------------------------------------------------------------------  1. DDD (degenerative disc disease), cervical He continues to do well with his current regimen. We'll go ahead and refill for May 25 and June 24 with return to clinic in 2 months. We have reviewed the [facility] practitioner database and it is appropriate.  2. DDD (degenerative disc disease), lumbar  Return to clinic in 2 month for reevaluation.   3. Primary osteoarthritis of both knees  4. Lumbosacral facet joint syndrome  -  5. Spinal stenosis, lumbar region, with neurogenic claudication  -  6. Chronic bilateral low back pain with bilateral  sciatica    ----------------------------------------------------------------------------------------------------------------------  I am having Mr. Lebron maintain his esomeprazole, diphenhydrAMINE, Ferrous Gluconate (IRON 27 PO), Polyethylene Glycol 3350 (CLEARLAX PO), levothyroxine, metolazone, torsemide, and morphine.   Meds ordered this encounter  Medications  . DISCONTD: morphine (MS CONTIN) 30 MG 12 hr tablet    Sig: Take 1 tablet (30 mg total) by mouth 3 (three) times daily with meals.    Dispense:  90 tablet    Refill:  0    Do not fill until [id number]  . morphine (MS CONTIN) 30 MG 12 hr tablet    Sig: Take 1 tablet (30 mg total) by mouth 3 (three) times daily with meals.    Dispense:  90 tablet    Refill:  0    Do not fill until [id number]       Follow-up: Return in about 1 month (around 09/17/16) for evaluation, med refill.    Molli Barrows, MD   The [place] practitioner database for opioid medications on this patient has been reviewed by me and my staff   Greater than 50% of the total encounter time was spent in counseling and / or coordination of care.     This dictation was performed utilizing Systems analyst.  Please excuse any unintentional or mistaken typographical errors as a result.

## 2016-09-20 ENCOUNTER — Institutional Professional Consult (permissible substitution): Payer: Medicare Other | Admitting: Internal Medicine

## 2016-10-01 DEATH — deceased

## 2016-10-10 ENCOUNTER — Other Ambulatory Visit: Payer: Medicare Other

## 2016-10-17 ENCOUNTER — Ambulatory Visit: Payer: Medicare Other

## 2016-10-19 ENCOUNTER — Encounter: Payer: Medicare Other | Admitting: Anesthesiology

## 2016-11-08 IMAGING — CT CT CHEST W/O CM
2 of 3 series · 15 of 36 positions shown, 18 images · non-contrast
Comparison: Current chest radiograph

CLINICAL DATA: Via wheelchair from [REDACTED], right calf pain x"a few
days" Abnormal chest x-ray today. Per x-ray report "New right lower
lobe airspace disease superimposed on chronicelevation of the right
hemidiaphragm is concerning for pneumonia.2. Right pleural
effusion.3. The left lung is clear.4. Atherosclerosis of the
thoracic aorta."

EXAM:
CT CHEST WITHOUT CONTRAST
TECHNIQUE: Multidetector CT imaging of the chest was performed following the
standard protocol without IV contrast.

[Series 2: thorax · axial · 0.72mm/px · z∈[-757,-449]mm · 12 of 182 slices shown, 15 images]
[im 14/182  mediastinal]
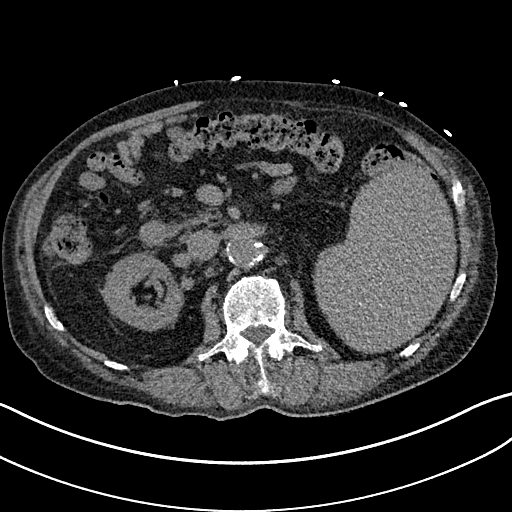
[im 14/182  lung]
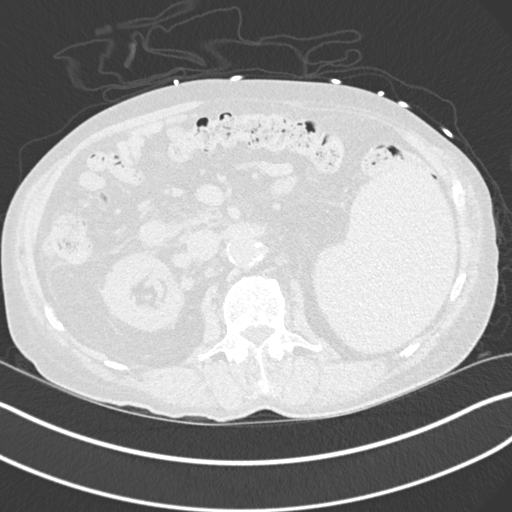
[im 27/182  lung]
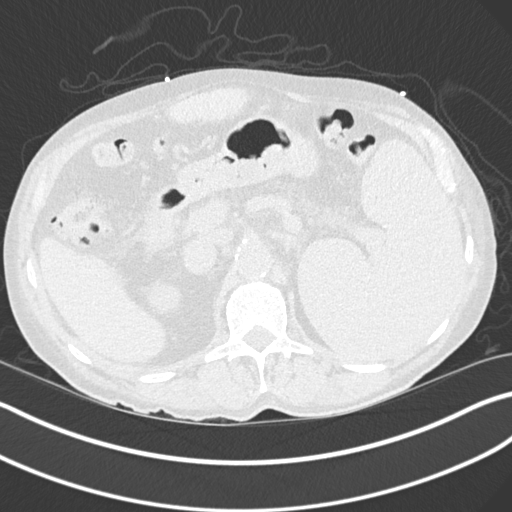
[im 41/182  lung]
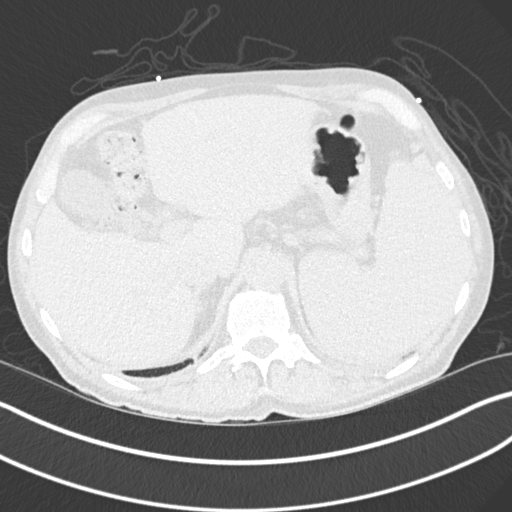
[im 54/182  lung]
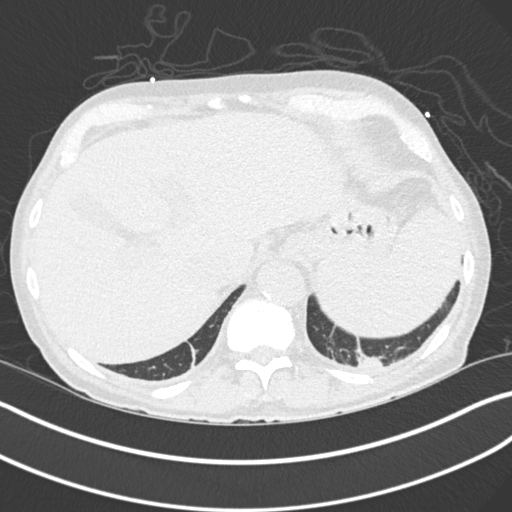
[im 68/182  mediastinal]
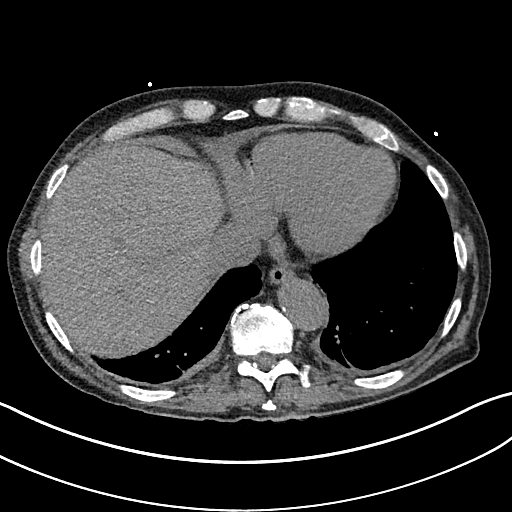
[im 68/182  lung]
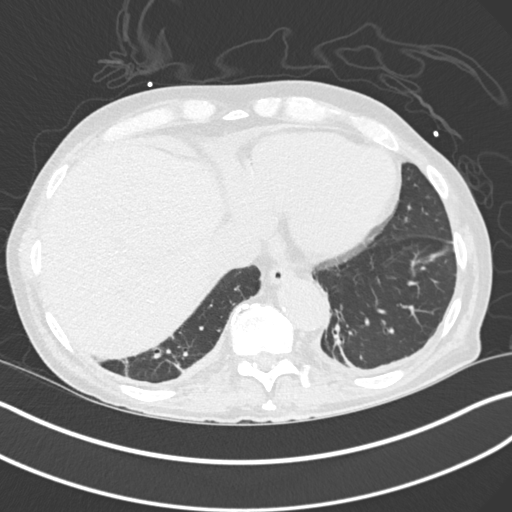
[im 81/182  lung]
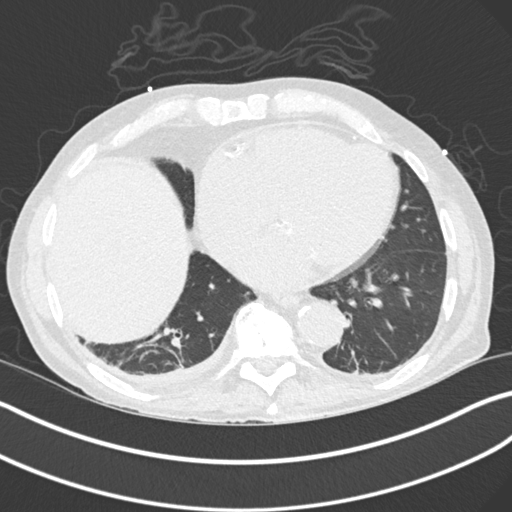
[im 101/182  lung]
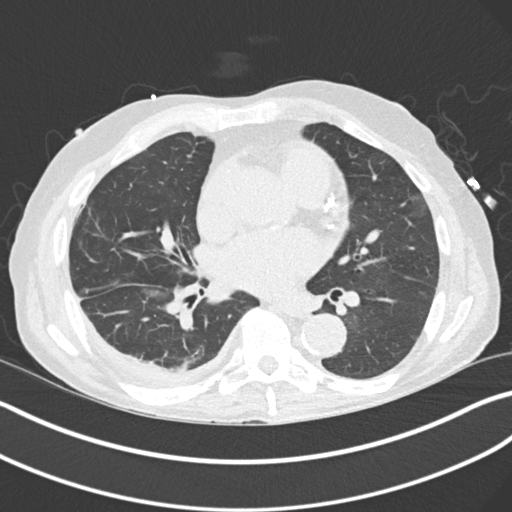
[im 114/182  lung]
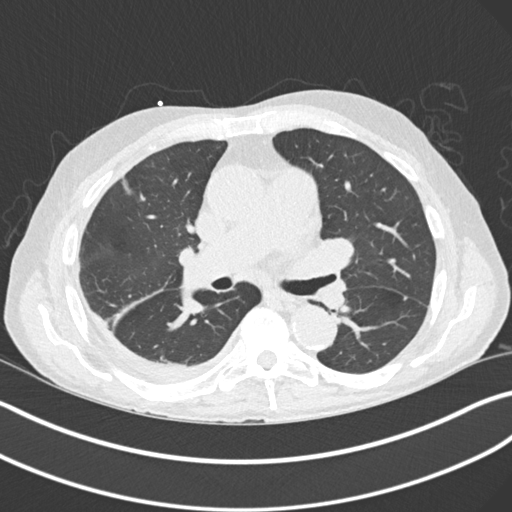
[im 128/182  mediastinal]
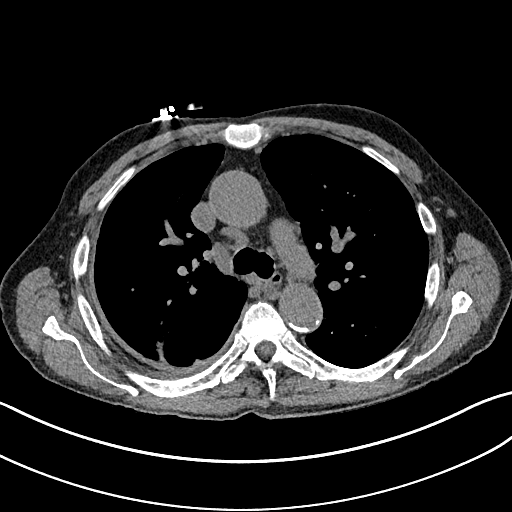
[im 128/182  lung]
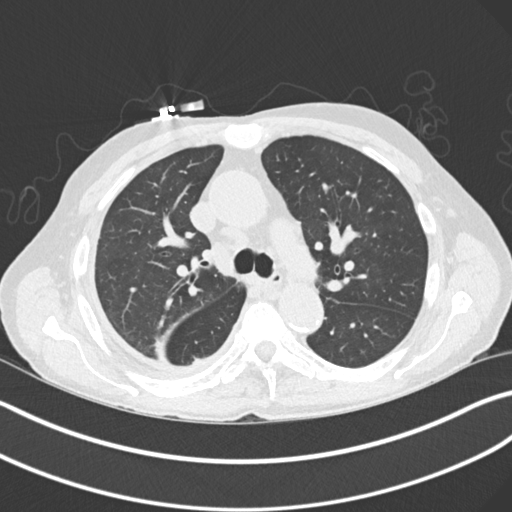
[im 141/182  lung]
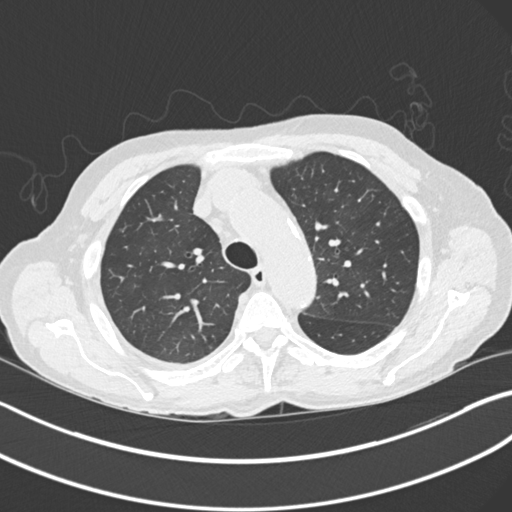
[im 155/182  lung]
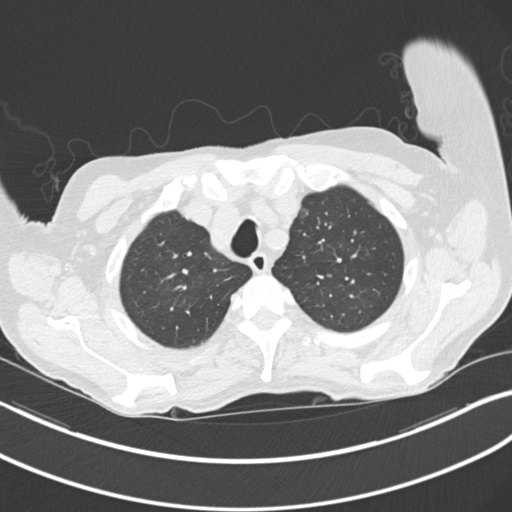
[im 168/182  lung]
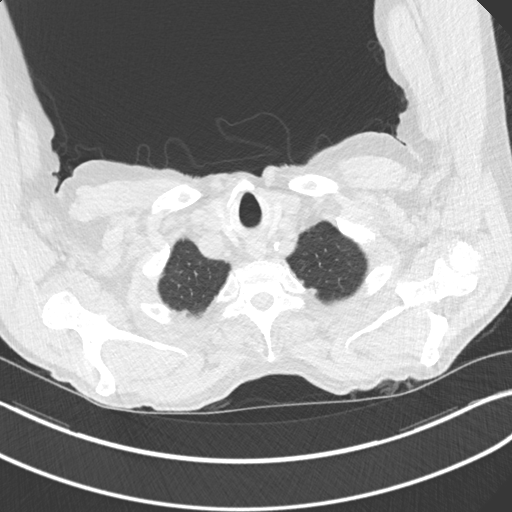

[Series 5: coronal · coronal · 0.70mm/px · 3 of 126 slices shown]
[im 26/126  lung]
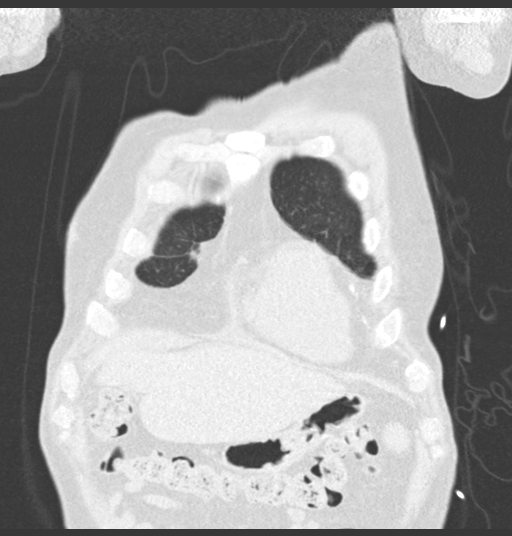
[im 51/126  lung]
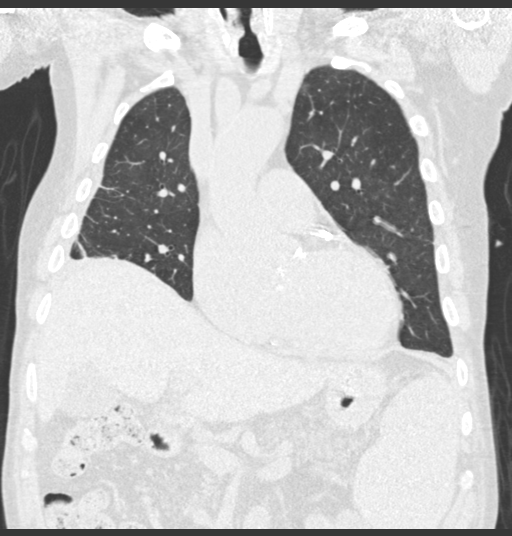
[im 76/126  lung]
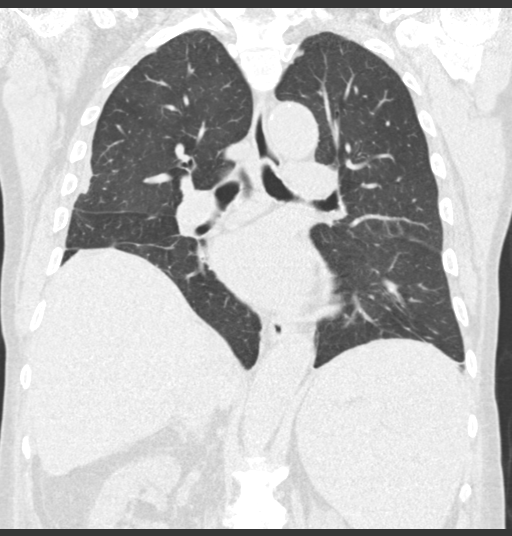

[15 of 36 positions shown; findings below may reference images not displayed]

FINDINGS: Neck base and axilla:  No mass or adenopathy.

Mediastinum and hila: Heart is normal in size. There are dense
coronary artery calcifications. Great vessels normal in caliber.
Atherosclerotic calcifications are noted along the aortic arch and
descending thoracic aorta. There are prominent mediastinal lymph
nodes. Largest is a right peritracheal, azygos level node measuring
15 mm in short axis. No mediastinal masses. No hilar masses or
discrete enlarged lymph nodes.

Lungs/Pleura: Small right pleural effusion. There is linear and
reticular opacity in both lung bases consistent with atelectasis.
There is 7 mm irregular focal opacity in the right middle lobe.
There is a larger focal area of consolidation in the left lower
lobe, centered on image 90, series 3, measuring 19 x 15 mm
transversely. There is no evidence of pulmonary edema. No
pneumothorax.

Upper Abdomen: Spleen is enlarged measuring 16.6 cm in greatest
transverse dimension. No liver mass or focal lesion. Liver shows
some central volume loss and relative enlargement of the lateral
segment of the left lobe. Consider cirrhosis in the proper clinical
setting. The pancreas is mostly fatty replaced. There is a stone in
the upper pole the right kidney. There are low-density renal masses,
incompletely imaged, likely cysts.

Musculoskeletal: Mild degenerative changes of the mid to lower
thoracic spine. No osteoblastic or osteolytic lesions.
IMPRESSION: 1. Small focal area of consolidation in the left lower lobe. This
may be due to infection or inflammation. Neoplastic disease is felt
unlikely but not excluded. There is a smaller area of focal opacity
measuring 7 mm in the right middle lobe. Non-contrast chest CT at
3-6 months is recommended. If nodules persist, subsequent management
will be based upon the most suspicious nodule(s). This
recommendation follows the consensus statement: Guidelines for
Management of Incidental Pulmonary Nodules Detected on CT
Images:From the [HOSPITAL] 1593; published online before
print (10.1148/[id number]).
2. Mild basilar atelectasis and small right pleural effusion. No
pulmonary edema.
3. Splenic enlargement. Morphologic changes of the liver raising the
possibility of cirrhosis. Nonobstructing right intrarenal stone.
Probable renal cysts, incompletely imaged.
4. Dense coronary artery calcifications.
5. Prominent to borderline/mildly enlarged mediastinal lymph nodes,
likely reactive.
